# Patient Record
Sex: Male | Born: 1965 | Race: White | Hispanic: No | Marital: Single | Smoking: Never smoker
Health system: Southern US, Community
[De-identification: ages and names within clinical notes are randomized; demographics above are authoritative.]

## PROBLEM LIST (undated history)

## (undated) DIAGNOSIS — I639 Cerebral infarction, unspecified: Secondary | ICD-10-CM

## (undated) DIAGNOSIS — K219 Gastro-esophageal reflux disease without esophagitis: Secondary | ICD-10-CM

## (undated) DIAGNOSIS — I6529 Occlusion and stenosis of unspecified carotid artery: Secondary | ICD-10-CM

## (undated) DIAGNOSIS — I10 Essential (primary) hypertension: Secondary | ICD-10-CM

## (undated) DIAGNOSIS — E785 Hyperlipidemia, unspecified: Secondary | ICD-10-CM

## (undated) DIAGNOSIS — I2510 Atherosclerotic heart disease of native coronary artery without angina pectoris: Secondary | ICD-10-CM

## (undated) DIAGNOSIS — I252 Old myocardial infarction: Secondary | ICD-10-CM

## (undated) HISTORY — DX: Hyperlipidemia, unspecified: E78.5

## (undated) HISTORY — DX: Occlusion and stenosis of unspecified carotid artery: I65.29

## (undated) HISTORY — DX: Essential (primary) hypertension: I10

## (undated) HISTORY — PX: KNEE SURGERY: SHX244

## (undated) HISTORY — PX: BACK SURGERY: SHX140

## (undated) HISTORY — DX: Old myocardial infarction: I25.2

## (undated) HISTORY — DX: Atherosclerotic heart disease of native coronary artery without angina pectoris: I25.10

---

## 2001-01-08 ENCOUNTER — Encounter: Payer: Self-pay | Admitting: Emergency Medicine

## 2001-01-08 ENCOUNTER — Emergency Department (HOSPITAL_COMMUNITY): Admission: EM | Admit: 2001-01-08 | Discharge: 2001-01-08 | Payer: Self-pay | Admitting: Emergency Medicine

## 2003-01-07 ENCOUNTER — Encounter: Payer: Self-pay | Admitting: Emergency Medicine

## 2003-01-07 ENCOUNTER — Emergency Department (HOSPITAL_COMMUNITY): Admission: EM | Admit: 2003-01-07 | Discharge: 2003-01-07 | Payer: Self-pay | Admitting: Emergency Medicine

## 2003-01-09 ENCOUNTER — Ambulatory Visit (HOSPITAL_COMMUNITY): Admission: RE | Admit: 2003-01-09 | Discharge: 2003-01-10 | Payer: Self-pay | Admitting: Neurosurgery

## 2010-07-19 ENCOUNTER — Encounter: Payer: Self-pay | Admitting: Orthopedic Surgery

## 2010-07-19 ENCOUNTER — Ambulatory Visit (INDEPENDENT_AMBULATORY_CARE_PROVIDER_SITE_OTHER): Payer: BC Managed Care – PPO | Admitting: Orthopedic Surgery

## 2010-07-19 DIAGNOSIS — M19039 Primary osteoarthritis, unspecified wrist: Secondary | ICD-10-CM | POA: Insufficient documentation

## 2010-07-23 ENCOUNTER — Telehealth: Payer: Self-pay | Admitting: Orthopedic Surgery

## 2010-07-28 NOTE — Progress Notes (Signed)
Summary: Referral to Dr. Amanda Pea.  Phone Note Outgoing Call   Call placed by: Waldon Reining,  July 23, 2010 10:37 AM Call placed to: Specialist Action Taken: Information Sent Summary of Call: I faxed a referral for this patient to Dr. Amanda Pea to be seen for [facility] of left wrist.

## 2010-07-28 NOTE — Assessment & Plan Note (Signed)
Summary: wrsit pain needs xr/bcbs/bsf  coming 3:45   Vital Signs:  Patient profile:   45 year old male Height:      61 inches Weight:      231 pounds Pulse rate:   72 / minute Resp:     18 per minute  Vitals Entered By: Fuller Canada MD (July 19, 2010 4:24 PM)  Visit Type:  new patient Referring Provider:  self Primary Provider:  NA  CC:  wrist pain.  History of Present Illness: I saw Justin Villa in the office today for an initial visit.  He is a 45 years old man with the complaint of:  wrist pain.  He has LEFT wrist pain for the last 2 months. Questionable golf injury swinging a club, but nothing significant. He has pain on the radial  side of the wrist with swelling and what appears to be a mass. His pain level is 4/10. Sharp pain which tends to be increased with activity.   MEDS: NONE    Allergies (verified): No Known Drug Allergies  Past History:  Past Medical History: na  Past Surgical History: knee  back  Family History: na  Social History: Patient is married.  unemployed no smoking no alcohol one cup of caffeine per day 12th grade ed  Review of Systems Constitutional:  Denies weight loss, weight gain, fever, chills, and fatigue. Cardiovascular:  Denies chest pain, palpitations, fainting, and murmurs. Respiratory:  Denies short of breath, wheezing, couch, tightness, pain on inspiration, and snoring . Gastrointestinal:  Denies heartburn, nausea, vomiting, diarrhea, constipation, and blood in your stools. Genitourinary:  Denies frequency, urgency, difficulty urinating, painful urination, flank pain, and bleeding in urine. Neurologic:  Denies numbness, tingling, unsteady gait, dizziness, tremors, and seizure. Musculoskeletal:  Denies joint pain, swelling, instability, stiffness, redness, heat, and muscle pain. Endocrine:  Denies excessive thirst, exessive urination, and heat or cold intolerance. Psychiatric:  Denies nervousness, depression,  anxiety, and hallucinations. Skin:  Denies changes in the skin, poor healing, rash, itching, and redness. HEENT:  Denies blurred or double vision, eye pain, redness, and watering. Immunology:  Denies seasonal allergies, sinus problems, and allergic to bee stings. Hemoatologic:  Denies easy bleeding and brusing.  Physical Exam  Skin:  intact without lesions or rashes Cervical Nodes:  no significant adenopathy Psych:  alert and cooperative; normal mood and affect; normal attention span and concentration   Wrist/Hand Exam  General:    Well-developed, well-nourished, in no acute distress; alert and oriented x 3.    Inspection:    DORSAL-RADIAL LEFT WRISTswelling:   Palpation:    tenderness L-wrist: RADIO-SCAPHOID JOINT   Vascular:    Radial, ulnar, brachial, and axillary pulses 2+ and symmetric; capillary refill less than 2 seconds; no evidence of ischemia, clubbing, or cyanosis.    Sensory:    Gross sensation intact in the upper extremities.    Motor:    GRIP STRENGTH LEFT LESS THAN RIGHT   Wrist Exam:    Right:    Inspection:  Normal    Palpation:  Normal    Stability:  stable    Tenderness:  no    NORMAL ROM     Left:    Inspection:  Abnormal    Palpation:  Abnormal    Stability:  stable    Tenderness:  RADIO-SCAPHOID     Swelling:  YES RADIAL SIDE     DECREASED WRIST EXTENSION WITH PAIN   DECREASED FLEXION    Impression & Recommendations:  Problem #  1:  ARTHRITIS, WRIST (ICD-716.93)  Separate and Identifiable X-Ray report     AP and lateral, and oblique of the LEFT wrist.  There are multiple spurs at the edge of the scaphoid in the area is collapse of the radial scaphoid joint with some proximal migration of the capitate. The radio lunate joint is still preserved.  Impression radial scaphoid arthritis stage II/III    Based on the radiographic findings I have initiated treatment to include anti-inflammatories and bracing of the LEFT wrist and a referral  to a hand specialist for further advice regarding treatment.  Orders: Orthopedic Surgeon Referral (Ortho Surgeon) New Patient Level III [phone]) Wrist x-ray complete, minimum 3 views (73110)  Medications Added to Medication List This Visit: 1)  Nabumetone 500 Mg Tabs (Nabumetone) .Marland Kitchen.. 1 by mouth bid  Patient Instructions: 1)  Dr. Amanda Pea referral for advanced arthritis of the Radio-Scaphoid joint  Prescriptions: NABUMETONE 500 MG TABS (NABUMETONE) 1 by mouth bid  #60 x 2   Entered and Authorized by:   Fuller Canada MD   Signed by:   Fuller Canada MD on 07/19/2010   Method used:   Faxed to ...       [facility]* (retail)       [address]       [place], [place]  [address]       Ph: [phone]       Fax: [phone]   RxID:   [id number]    Orders Added: 1)  Orthopedic Surgeon Referral [Ortho Surgeon] 2)  New Patient Level III [44010] 3)  Wrist x-ray complete, minimum 3 views [73110]

## 2010-07-28 NOTE — Letter (Signed)
Summary: History form  History form   Imported By: Jacklynn Ganong 07/22/2010 17:00:20  _____________________________________________________________________  External Attachment:    Type:   Image     Comment:   External Document

## 2015-01-03 ENCOUNTER — Emergency Department (HOSPITAL_COMMUNITY)
Admission: EM | Admit: 2015-01-03 | Discharge: 2015-01-04 | Disposition: A | Discharge status: Home / Self Care | Payer: Self-pay | Attending: Emergency Medicine | Admitting: Emergency Medicine

## 2015-01-03 ENCOUNTER — Encounter (HOSPITAL_COMMUNITY): Payer: Self-pay | Admitting: *Deleted

## 2015-01-03 ENCOUNTER — Emergency Department (HOSPITAL_COMMUNITY): Payer: Self-pay

## 2015-01-03 DIAGNOSIS — R55 Syncope and collapse: Secondary | ICD-10-CM | POA: Insufficient documentation

## 2015-01-03 DIAGNOSIS — I10 Essential (primary) hypertension: Secondary | ICD-10-CM | POA: Insufficient documentation

## 2015-01-03 DIAGNOSIS — R51 Headache: Secondary | ICD-10-CM | POA: Insufficient documentation

## 2015-01-03 DIAGNOSIS — R61 Generalized hyperhidrosis: Secondary | ICD-10-CM | POA: Insufficient documentation

## 2015-01-03 LAB — TROPONIN I

## 2015-01-03 LAB — BASIC METABOLIC PANEL
Anion gap: 12 (ref 5–15)
BUN: 18 mg/dL (ref 6–20)
CO2: 22 mmol/L (ref 22–32)
Calcium: 8.8 mg/dL — ABNORMAL LOW (ref 8.9–10.3)
Chloride: 107 mmol/L (ref 101–111)
Creatinine, Ser: 1.13 mg/dL (ref 0.61–1.24)
GFR calc Af Amer: >60 mL/min (ref >60)
GFR calc non Af Amer: >60 mL/min (ref >60)
Glucose, Bld: 124 mg/dL — ABNORMAL HIGH (ref 65–99)
Potassium: 3.3 mmol/L — ABNORMAL LOW (ref 3.5–5.1)
Sodium: 141 mmol/L (ref 135–145)

## 2015-01-03 LAB — CBC WITH DIFFERENTIAL/PLATELET
BASOS PCT: 1 % (ref 0–1)
Basophils Absolute: 0.1 10*3/uL (ref 0.0–0.1)
EOS ABS: 0.4 10*3/uL (ref 0.0–0.7)
Eosinophils Relative: 4 % (ref 0–5)
HCT: 42.9 % (ref 39.0–52.0)
Hemoglobin: 15.3 g/dL (ref 13.0–17.0)
LYMPHS ABS: 3.4 10*3/uL (ref 0.7–4.0)
Lymphocytes Relative: 39 % (ref 12–46)
MCH: 29.4 pg (ref 26.0–34.0)
MCHC: 35.7 g/dL (ref 30.0–36.0)
MCV: 82.3 fL (ref 78.0–100.0)
Monocytes Absolute: 0.9 10*3/uL (ref 0.1–1.0)
Monocytes Relative: 11 % (ref 3–12)
NEUTROS PCT: 45 % (ref 43–77)
Neutro Abs: 4 10*3/uL (ref 1.7–7.7)
PLATELETS: 259 10*3/uL (ref 150–400)
RBC: 5.21 MIL/uL (ref 4.22–5.81)
RDW: 13.4 % (ref 11.5–15.5)
WBC: 8.7 10*3/uL (ref 4.0–10.5)

## 2015-01-03 LAB — RAPID URINE DRUG SCREEN, HOSP PERFORMED
Amphetamines: NOT DETECTED
Barbiturates: NOT DETECTED
Benzodiazepines: NOT DETECTED
COCAINE: NOT DETECTED
OPIATES: NOT DETECTED
Tetrahydrocannabinol: NOT DETECTED

## 2015-01-03 NOTE — ED Notes (Signed)
Per pt's son pt had passed out and possible jerking movements, pt alert and oriented to year, month and place; pt denies any pain but is diaphoretic currently

## 2015-01-03 NOTE — ED Notes (Signed)
Pt states she is unable to urinate at this time. Urinal placed at bedside. Advised that specimen needed as soon as possible.
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
## 2015-01-03 NOTE — ED Notes (Signed)
Pt's wife entered room and stated that pt just finished mowing the yard. Pt also informed nurse that he was stung by a bee to left eye as well. Denies any known allergies to bee stings

## 2015-01-04 LAB — URINALYSIS, ROUTINE W REFLEX MICROSCOPIC
BILIRUBIN URINE: NEGATIVE
GLUCOSE, UA: NEGATIVE mg/dL
HGB URINE DIPSTICK: NEGATIVE
KETONES UR: NEGATIVE mg/dL
LEUKOCYTES UA: NEGATIVE
NITRITE: NEGATIVE
PH: 5.5 (ref 5.0–8.0)
Protein, ur: 30 mg/dL — AB
Specific Gravity, Urine: >1.03 — ABNORMAL HIGH (ref 1.005–1.030)
Urobilinogen, UA: 0.2 mg/dL (ref 0.0–1.0)

## 2015-01-04 LAB — URINE MICROSCOPIC-ADD ON

## 2015-01-04 NOTE — Discharge Instructions (Signed)
Syncope °Syncope is a medical term for fainting or passing out. This means you lose consciousness and drop to the ground. People are generally unconscious for less than 5 minutes. You may have some muscle twitches for up to 15 seconds before waking up and returning to normal. Syncope occurs more often in older adults, but it can happen to anyone. While most causes of syncope are not dangerous, syncope can be a sign of a serious medical problem. It is important to seek medical care.  °CAUSES  °Syncope is caused by a sudden drop in blood flow to the brain. The specific cause is often not determined. Factors that can bring on syncope include: °· Taking medicines that lower blood pressure. °· Sudden changes in posture, such as standing up quickly. °· Taking more medicine than prescribed. °· Standing in one place for too long. °· Seizure disorders. °· Dehydration and excessive exposure to heat. °· Low blood sugar (hypoglycemia). °· Straining to have a bowel movement. °· Heart disease, irregular heartbeat, or other circulatory problems. °· Fear, emotional distress, seeing blood, or severe pain. °SYMPTOMS  °Right before fainting, you may: °· Feel dizzy or light-headed. °· Feel nauseous. °· See all white or all black in your field of vision. °· Have cold, clammy skin. °DIAGNOSIS  °Your health care provider will ask about your symptoms, perform a physical exam, and perform an electrocardiogram (ECG) to record the electrical activity of your heart. Your health care provider may also perform other heart or blood tests to determine the cause of your syncope which may include: °· Transthoracic echocardiogram (TTE). During echocardiography, sound waves are used to evaluate how blood flows through your heart. °· Transesophageal echocardiogram (TEE). °· Cardiac monitoring. This allows your health care provider to monitor your heart rate and rhythm in real time. °· Holter monitor. This is a portable device that records your  heartbeat and can help diagnose heart arrhythmias. It allows your health care provider to track your heart activity for several days, if needed. °· Stress tests by exercise or by giving medicine that makes the heart beat faster. °TREATMENT  °In most cases, no treatment is needed. Depending on the cause of your syncope, your health care provider may recommend changing or stopping some of your medicines. °HOME CARE INSTRUCTIONS °· Have someone stay with you until you feel stable. °· Do not drive, use machinery, or play sports until your health care provider says it is okay. °· Keep all follow-up appointments as directed by your health care provider. °· Lie down right away if you start feeling like you might faint. Breathe deeply and steadily. Wait until all the symptoms have passed. °· Drink enough fluids to keep your urine clear or pale yellow. °· If you are taking blood pressure or heart medicine, get up slowly and take several minutes to sit and then stand. This can reduce dizziness. °SEEK IMMEDIATE MEDICAL CARE IF:  °· You have a severe headache. °· You have unusual pain in the chest, abdomen, or back. °· You are bleeding from your mouth or rectum, or you have black or tarry stool. °· You have an irregular or very fast heartbeat. °· You have pain with breathing. °· You have repeated fainting or seizure-like jerking during an episode. °· You faint when sitting or lying down. °· You have confusion. °· You have trouble walking. °· You have severe weakness. °· You have vision problems. °If you fainted, call your local emergency services (911 in [place] Do not drive   yourself to the hospital.  MAKE SURE YOU:  Understand these instructions.  Will watch your condition.  Will get help right away if you are not doing well or get worse. Document Released: 05/23/2005 Document Revised: 05/28/2013 Document Reviewed: 07/22/2011 [facility] Patient Information 2015 [place], [place]. This information is not intended to replace  advice given to you by your health care provider. Make sure you discuss any questions you have with your health care provider.  Hypertension Hypertension, commonly called high blood pressure, is when the force of blood pumping through your arteries is too strong. Your arteries are the blood vessels that carry blood from your heart throughout your body. A blood pressure reading consists of a higher number over a lower number, such as 110/72. The higher number (systolic) is the pressure inside your arteries when your heart pumps. The lower number (diastolic) is the pressure inside your arteries when your heart relaxes. Ideally you want your blood pressure below 120/80. Hypertension forces your heart to work harder to pump blood. Your arteries may become narrow or stiff. Having hypertension puts you at risk for heart disease, stroke, and other problems.  RISK FACTORS Some risk factors for high blood pressure are controllable. Others are not.  Risk factors you cannot control include:   Race. You may be at higher risk if you are African American.  Age. Risk increases with age.  Gender. Men are at higher risk than women before age 57 years. After age 70, women are at higher risk than men. Risk factors you can control include:  Not getting enough exercise or physical activity.  Being overweight.  Getting too much fat, sugar, calories, or salt in your diet.  Drinking too much alcohol. SIGNS AND SYMPTOMS Hypertension does not usually cause signs or symptoms. Extremely high blood pressure (hypertensive crisis) may cause headache, anxiety, shortness of breath, and nosebleed. DIAGNOSIS  To check if you have hypertension, your health care provider will measure your blood pressure while you are seated, with your arm held at the level of your heart. It should be measured at least twice using the same arm. Certain conditions can cause a difference in blood pressure between your right and left arms. A blood  pressure reading that is higher than normal on one occasion does not mean that you need treatment. If one blood pressure reading is high, ask your health care provider about having it checked again. TREATMENT  Treating high blood pressure includes making lifestyle changes and possibly taking medicine. Living a healthy lifestyle can help lower high blood pressure. You may need to change some of your habits. Lifestyle changes may include:  Following the DASH diet. This diet is high in fruits, vegetables, and whole grains. It is low in salt, red meat, and added sugars.  Getting at least 2 hours of brisk physical activity every week.  Losing weight if necessary.  Not smoking.  Limiting alcoholic beverages.  Learning ways to reduce stress. If lifestyle changes are not enough to get your blood pressure under control, your health care provider may prescribe medicine. You may need to take more than one. Work closely with your health care provider to understand the risks and benefits. HOME CARE INSTRUCTIONS  Have your blood pressure rechecked as directed by your health care provider.   Take medicines only as directed by your health care provider. Follow the directions carefully. Blood pressure medicines must be taken as prescribed. The medicine does not work as well when you skip doses. Skipping doses  also puts you at risk for problems.   Do not smoke.   Monitor your blood pressure at home as directed by your health care provider. SEEK MEDICAL CARE IF:   You think you are having a reaction to medicines taken.  You have recurrent headaches or feel dizzy.  You have swelling in your ankles.  You have trouble with your vision. SEEK IMMEDIATE MEDICAL CARE IF:  You develop a severe headache or confusion.  You have unusual weakness, numbness, or feel faint.  You have severe chest or abdominal pain.  You vomit repeatedly.  You have trouble breathing. MAKE SURE YOU:   Understand  these instructions.  Will watch your condition.  Will get help right away if you are not doing well or get worse. Document Released: 05/23/2005 Document Revised: 10/07/2013 Document Reviewed: 03/15/2013 [facility] Patient Information 2015 [place], [place]. This information is not intended to replace advice given to you by your health care provider. Make sure you discuss any questions you have with your health care provider.  DASH Eating Plan DASH stands for "Dietary Approaches to Stop Hypertension." The DASH eating plan is a healthy eating plan that has been shown to reduce high blood pressure (hypertension). Additional health benefits may include reducing the risk of type 2 diabetes mellitus, heart disease, and stroke. The DASH eating plan may also help with weight loss. WHAT DO I NEED TO KNOW ABOUT THE DASH EATING PLAN? For the DASH eating plan, you will follow these general guidelines:  Choose foods with a percent daily value for sodium of less than 5% (as listed on the food label).  Use salt-free seasonings or herbs instead of table salt or sea salt.  Check with your health care provider or pharmacist before using salt substitutes.  Eat lower-sodium products, often labeled as "lower sodium" or "no salt added."  Eat fresh foods.  Eat more vegetables, fruits, and low-fat dairy products.  Choose whole grains. Look for the word "whole" as the first word in the ingredient list.  Choose fish and skinless chicken or [place] more often than red meat. Limit fish, poultry, and meat to 6 oz (170 g) each day.  Limit sweets, desserts, sugars, and sugary drinks.  Choose heart-healthy fats.  Limit cheese to 1 oz (28 g) per day.  Eat more home-cooked food and less restaurant, buffet, and fast food.  Limit fried foods.  Cook foods using methods other than frying.  Limit canned vegetables. If you do use them, rinse them well to decrease the sodium.  When eating at a restaurant, ask that your  food be prepared with less salt, or no salt if possible. WHAT FOODS CAN I EAT? Seek help from a dietitian for individual calorie needs. Grains Whole grain or whole wheat bread. Brown rice. Whole grain or whole wheat pasta. Quinoa, bulgur, and whole grain cereals. Low-sodium cereals. Corn or whole wheat flour tortillas. Whole grain cornbread. Whole grain crackers. Low-sodium crackers. Vegetables Fresh or frozen vegetables (raw, steamed, roasted, or grilled). Low-sodium or reduced-sodium tomato and vegetable juices. Low-sodium or reduced-sodium tomato sauce and paste. Low-sodium or reduced-sodium canned vegetables.  Fruits All fresh, canned (in natural juice), or frozen fruits. Meat and Other Protein Products Ground beef (85% or leaner), grass-fed beef, or beef trimmed of fat. Skinless chicken or [place]. Ground chicken or [place]. Pork trimmed of fat. All fish and seafood. Eggs. Dried beans, peas, or lentils. Unsalted nuts and seeds. Unsalted canned beans. Dairy Low-fat dairy products, such as skim or 1%  milk, 2% or reduced-fat cheeses, low-fat ricotta or cottage cheese, or plain low-fat yogurt. Low-sodium or reduced-sodium cheeses. Fats and Oils Tub margarines without trans fats. Light or reduced-fat mayonnaise and salad dressings (reduced sodium). Avocado. Safflower, olive, or canola oils. Natural peanut or almond butter. Other Unsalted popcorn and pretzels. The items listed above may not be a complete list of recommended foods or beverages. Contact your dietitian for more options. WHAT FOODS ARE NOT RECOMMENDED? Grains White bread. White pasta. White rice. Refined cornbread. Bagels and croissants. Crackers that contain trans fat. Vegetables Creamed or fried vegetables. Vegetables in a cheese sauce. Regular canned vegetables. Regular canned tomato sauce and paste. Regular tomato and vegetable juices. Fruits Dried fruits. Canned fruit in light or heavy syrup. Fruit juice. Meat and Other  Protein Products Fatty cuts of meat. Ribs, chicken wings, bacon, sausage, bologna, salami, chitterlings, fatback, hot dogs, bratwurst, and packaged luncheon meats. Salted nuts and seeds. Canned beans with salt. Dairy Whole or 2% milk, cream, half-and-half, and cream cheese. Whole-fat or sweetened yogurt. Full-fat cheeses or blue cheese. Nondairy creamers and whipped toppings. Processed cheese, cheese spreads, or cheese curds. Condiments Onion and garlic salt, seasoned salt, table salt, and sea salt. Canned and packaged gravies. Worcestershire sauce. Tartar sauce. Barbecue sauce. Teriyaki sauce. Soy sauce, including reduced sodium. Steak sauce. Fish sauce. Oyster sauce. Cocktail sauce. Horseradish. Ketchup and mustard. Meat flavorings and tenderizers. Bouillon cubes. Hot sauce. Tabasco sauce. Marinades. Taco seasonings. Relishes. Fats and Oils Butter, stick margarine, lard, shortening, ghee, and bacon fat. Coconut, palm kernel, or palm oils. Regular salad dressings. Other Pickles and olives. Salted popcorn and pretzels. The items listed above may not be a complete list of foods and beverages to avoid. Contact your dietitian for more information. WHERE CAN I FIND MORE INFORMATION? National Heart, [employer], and [employer] Institute: [employer] Document Released: 05/12/2011 Document Revised: 10/07/2013 Document Reviewed: 03/27/2013 [facility] Patient Information 2015 [place], [place]. This information is not intended to replace advice given to you by your health care provider. Make sure you discuss any questions you have with your health care provider.

## 2015-01-04 NOTE — ED Notes (Signed)
Discharge instructions given, pt demonstrated teach back and verbal understanding. No concerns voiced.  

## 2015-11-05 DIAGNOSIS — I252 Old myocardial infarction: Secondary | ICD-10-CM

## 2015-11-05 HISTORY — DX: Old myocardial infarction: I25.2

## 2015-11-25 ENCOUNTER — Inpatient Hospital Stay (HOSPITAL_COMMUNITY)
Admission: EM | Admit: 2015-11-25 | Discharge: 2015-12-01 | DRG: 234 | Disposition: A | Discharge status: Home / Self Care | Payer: PRIVATE HEALTH INSURANCE | Attending: Cardiothoracic Surgery | Admitting: Cardiothoracic Surgery

## 2015-11-25 DIAGNOSIS — R443 Hallucinations, unspecified: Secondary | ICD-10-CM | POA: Diagnosis not present

## 2015-11-25 DIAGNOSIS — I2510 Atherosclerotic heart disease of native coronary artery without angina pectoris: Secondary | ICD-10-CM | POA: Diagnosis present

## 2015-11-25 DIAGNOSIS — E669 Obesity, unspecified: Secondary | ICD-10-CM | POA: Diagnosis present

## 2015-11-25 DIAGNOSIS — Z01810 Encounter for preprocedural cardiovascular examination: Secondary | ICD-10-CM

## 2015-11-25 DIAGNOSIS — D62 Acute posthemorrhagic anemia: Secondary | ICD-10-CM | POA: Diagnosis not present

## 2015-11-25 DIAGNOSIS — I25110 Atherosclerotic heart disease of native coronary artery with unstable angina pectoris: Secondary | ICD-10-CM | POA: Diagnosis present

## 2015-11-25 DIAGNOSIS — Z7982 Long term (current) use of aspirin: Secondary | ICD-10-CM

## 2015-11-25 DIAGNOSIS — Z8249 Family history of ischemic heart disease and other diseases of the circulatory system: Secondary | ICD-10-CM

## 2015-11-25 DIAGNOSIS — I119 Hypertensive heart disease without heart failure: Secondary | ICD-10-CM | POA: Diagnosis present

## 2015-11-25 DIAGNOSIS — Z7902 Long term (current) use of antithrombotics/antiplatelets: Secondary | ICD-10-CM

## 2015-11-25 DIAGNOSIS — Z6831 Body mass index (BMI) 31.0-31.9, adult: Secondary | ICD-10-CM

## 2015-11-25 DIAGNOSIS — Z951 Presence of aortocoronary bypass graft: Secondary | ICD-10-CM

## 2015-11-25 DIAGNOSIS — I214 Non-ST elevation (NSTEMI) myocardial infarction: Principal | ICD-10-CM | POA: Diagnosis present

## 2015-11-25 DIAGNOSIS — E785 Hyperlipidemia, unspecified: Secondary | ICD-10-CM | POA: Diagnosis present

## 2015-11-25 DIAGNOSIS — J9811 Atelectasis: Secondary | ICD-10-CM | POA: Diagnosis not present

## 2015-11-25 DIAGNOSIS — I249 Acute ischemic heart disease, unspecified: Secondary | ICD-10-CM

## 2015-11-25 DIAGNOSIS — I200 Unstable angina: Secondary | ICD-10-CM | POA: Diagnosis not present

## 2015-11-25 DIAGNOSIS — E8770 Fluid overload, unspecified: Secondary | ICD-10-CM | POA: Diagnosis not present

## 2015-11-25 DIAGNOSIS — Z09 Encounter for follow-up examination after completed treatment for conditions other than malignant neoplasm: Secondary | ICD-10-CM

## 2015-11-25 NOTE — ED Provider Notes (Signed)
CSN: [id number]     Arrival date & time 11/25/15  2348 History  By signing my name below, I, Justin Villa, attest that this documentation has been prepared under the direction and in the presence of [name], MD. Electronically Signed: [name], ED Scribe. 11/25/2015. 12:14 AM.    Chief Complaint  Patient presents with  . Chest Pain   Patient is a 50 y.o. male presenting with chest pain. The history is provided by the patient. No language interpreter was used.  Chest Pain Pain location:  L chest Pain quality: sharp   Pain radiates to:  L arm Pain radiates to the back: no   Pain severity:  Moderate Onset quality:  Gradual Duration:  2 days Timing:  Constant Progression:  Worsening Context: raising an arm   Relieved by:  Nothing Worsened by:  Nothing tried Ineffective treatments:  None tried Associated symptoms: shortness of breath   Associated symptoms: no diaphoresis, no nausea and not vomiting   Risk factors: hypertension    HPI Comments: Justin Villa is a 50 y.o. male who presents to the Emergency Department complaining of gradually worsening waxing and weaning moderate sharp left-sided chest pain that radiates towards his left arm onset 2-3 days ago. He reports associated SOB. He notes that the pain is worse tonight. He states that lifting his left shoulder/arm above his head provides mild relief. No alleviating factors noted. Pt has not tried any medications PTA. He denies that he is a current smoker. She states that he was diagnosed with hypertension several years ago but is not on any current medication for it. He reports a family hx of heart issues with father and uncles in their 1850's. He denies any n/v or diaphoresis.   History reviewed. No pertinent past medical history. Past Surgical History  Procedure Laterality Date  . Back surgery    . Knee surgery     No family history on file. Social History  Substance Use Topics  . Smoking status:  Never Smoker   . Smokeless tobacco: None  . Alcohol Use: No    Review of Systems  Constitutional: Negative for diaphoresis.  Respiratory: Positive for shortness of breath.   Cardiovascular: Positive for chest pain.  Gastrointestinal: Negative for nausea and vomiting.  All other systems reviewed and are negative.     Allergies  Review of patient's allergies indicates no known allergies.  Home Medications   Prior to Admission medications   Not on File   BP 167/103 mmHg  Pulse 85  Temp(Src) 97.9 F (36.6 C) (Oral)  Resp 19  Ht 6' (1.829 m)  Wt 230 lb (104.327 kg)  BMI 31.19 kg/m2  SpO2 95% Physical Exam  Constitutional: He is oriented to person, place, and time. He appears well-developed and well-nourished. He appears distressed.  HENT:  Head: Normocephalic and atraumatic.  Right Ear: Hearing normal.  Left Ear: Hearing normal.  Nose: Nose normal.  Mouth/Throat: Oropharynx is clear and moist and mucous membranes are normal.  Eyes: Conjunctivae and EOM are normal. Pupils are equal, round, and reactive to light.  Neck: Normal range of motion. Neck supple.  Cardiovascular: Regular rhythm, S1 normal and S2 normal.  Tachycardia present.  Exam reveals no gallop and no friction rub.   No murmur heard. Pulmonary/Chest: Effort normal and breath sounds normal. No respiratory distress. He exhibits no tenderness.  Abdominal: Soft. Normal appearance and bowel sounds are normal. There is no hepatosplenomegaly. There is no tenderness. There is no  rebound, no guarding, no tenderness at McBurney's point and negative Murphy's sign. No hernia.  Musculoskeletal: Normal range of motion.  Neurological: He is alert and oriented to person, place, and time. He has normal strength. No cranial nerve deficit or sensory deficit. Coordination normal. GCS eye subscore is 4. GCS verbal subscore is 5. GCS motor subscore is 6.  Skin: Skin is warm, dry and intact. No rash noted. No cyanosis.  Psychiatric:  He has a normal mood and affect. His speech is normal and behavior is normal. Thought content normal.  Nursing note and vitals reviewed.   ED Course  Procedures (including critical care time) DIAGNOSTIC STUDIES: Oxygen Saturation is 96% on RA, normal by my interpretation.    COORDINATION OF CARE: 12:12 AM- Pt advised of plan for treatment and pt agrees. Pt will receive NTG and aspirin. He will also receive chest x-ray and lab work for further evaluation.    Labs Review Labs Reviewed  CBC WITH DIFFERENTIAL/PLATELET - Abnormal; Notable for the following:    MCHC 36.3 (*)    All other components within normal limits  COMPREHENSIVE METABOLIC PANEL  LIPASE, BLOOD  TROPONIN I  BRAIN NATRIURETIC PEPTIDE  PROTIME-INR  APTT  I-STAT TROPOININ, ED    Imaging Review Dg Chest Port 1 View  11/26/2015  CLINICAL DATA:  Acute onset of moderate sharp left-sided chest pain, radiating to the left arm. Shortness of breath. Initial encounter. EXAM: PORTABLE CHEST 1 VIEW COMPARISON:  Chest radiograph performed 01/03/2015 FINDINGS: The lungs are well-aerated. Pulmonary vascularity is at the upper limits of normal. There is no evidence of focal opacification, pleural effusion or pneumothorax. The cardiomediastinal silhouette is borderline normal in size. No acute osseous abnormalities are seen. IMPRESSION: No acute cardiopulmonary process seen. Electronically Signed   By: [name] M.D.   On: 11/26/2015 00:40     [name], MD has personally reviewed and evaluated these images and lab results as part of his medical decision-making.   ED ECG REPORT   Date: 11/26/2015  Rate: 96  Rhythm: normal sinus rhythm  QRS Axis: left  Intervals: normal  ST/T Wave abnormalities: diffuse ST depressions suggestive of ischemia  Conduction Disutrbances:left anterior fascicular block  Narrative Interpretation:   Old EKG Reviewed: changes noted  I have personally reviewed the EKG tracing and agree  with the computerized printout as noted.     MDM   Final diagnoses:  ACS (acute coronary syndrome) [place])    Patient presents to the emergency room for evaluation of chest pain. Patient reports intermittent left-sided chest pain for the last 2 or 3 days but pain has become continuous and significantly worse tonight. Patient reports a deep and sometimes sharp pain in the left chest that radiates to the left arm. He is feeling shortness of breath associated with the symptoms. Presentation EKG shows evidence of diffuse ischemia. He does have elevations in aVR and V1, this could be indicative of left main disease. Posterior EKG, however, did not show elevations to suggest acute posterior MI. Discussed with Dr. Allena Katz, on call for cardiology. He has reviewed the patient's EKG and agrees that the patient should be transferred to [facility] stepdown unit under care of cardiology service. No evidence of STEMI present.  Patient was very hypertensive at arrival. This was treated with initially sublingual nitroglycerin and then IV nitroglycerin. Patient given aspirin at presentation. At request of Dr. Allena Katz, given 5 mg of Lopressor IV. Patient's pain is improving.  CRITICAL CARE Performed by:  Jackqulyn Mendel J.   Total critical care time: 30 minutes  Critical care time was exclusive of separately billable procedures and treating other patients.  Critical care was necessary to treat or prevent imminent or life-threatening deterioration.  Critical care was time spent personally by me on the following activities: development of treatment plan with patient and/or surrogate as well as nursing, discussions with consultants, evaluation of patient's response to treatment, examination of patient, obtaining history from patient or surrogate, ordering and performing treatments and interventions, ordering and review of laboratory studies, ordering and review of radiographic studies, pulse oximetry and  re-evaluation of patient's condition.   I personally performed the services described in this documentation, which was scribed in my presence. The recorded information has been reviewed and is accurate.   [name], MD 11/26/15 [phone]

## 2015-11-25 NOTE — ED Notes (Signed)
Pt c/o left chest and left arm pain x 2-3 days, worse today, states is worse with deep breath and movement.

## 2015-11-26 ENCOUNTER — Inpatient Hospital Stay (HOSPITAL_COMMUNITY): Payer: PRIVATE HEALTH INSURANCE

## 2015-11-26 ENCOUNTER — Other Ambulatory Visit: Payer: Self-pay

## 2015-11-26 ENCOUNTER — Encounter (HOSPITAL_COMMUNITY)
Admission: EM | Disposition: A | Discharge status: Home / Self Care | Payer: Self-pay | Source: Home / Self Care | Attending: Cardiothoracic Surgery

## 2015-11-26 ENCOUNTER — Emergency Department (HOSPITAL_COMMUNITY): Payer: PRIVATE HEALTH INSURANCE

## 2015-11-26 ENCOUNTER — Encounter (HOSPITAL_COMMUNITY): Payer: Self-pay

## 2015-11-26 DIAGNOSIS — E8770 Fluid overload, unspecified: Secondary | ICD-10-CM | POA: Diagnosis not present

## 2015-11-26 DIAGNOSIS — E669 Obesity, unspecified: Secondary | ICD-10-CM | POA: Diagnosis not present

## 2015-11-26 DIAGNOSIS — Z8249 Family history of ischemic heart disease and other diseases of the circulatory system: Secondary | ICD-10-CM | POA: Diagnosis not present

## 2015-11-26 DIAGNOSIS — I2510 Atherosclerotic heart disease of native coronary artery without angina pectoris: Secondary | ICD-10-CM

## 2015-11-26 DIAGNOSIS — E785 Hyperlipidemia, unspecified: Secondary | ICD-10-CM | POA: Diagnosis not present

## 2015-11-26 DIAGNOSIS — I214 Non-ST elevation (NSTEMI) myocardial infarction: Secondary | ICD-10-CM | POA: Diagnosis present

## 2015-11-26 DIAGNOSIS — I119 Hypertensive heart disease without heart failure: Secondary | ICD-10-CM | POA: Diagnosis not present

## 2015-11-26 DIAGNOSIS — I200 Unstable angina: Secondary | ICD-10-CM | POA: Diagnosis present

## 2015-11-26 DIAGNOSIS — R443 Hallucinations, unspecified: Secondary | ICD-10-CM | POA: Diagnosis not present

## 2015-11-26 DIAGNOSIS — I25110 Atherosclerotic heart disease of native coronary artery with unstable angina pectoris: Secondary | ICD-10-CM | POA: Diagnosis not present

## 2015-11-26 DIAGNOSIS — J9811 Atelectasis: Secondary | ICD-10-CM | POA: Diagnosis not present

## 2015-11-26 DIAGNOSIS — I10 Essential (primary) hypertension: Secondary | ICD-10-CM

## 2015-11-26 DIAGNOSIS — Z6831 Body mass index (BMI) 31.0-31.9, adult: Secondary | ICD-10-CM | POA: Diagnosis not present

## 2015-11-26 DIAGNOSIS — Z7982 Long term (current) use of aspirin: Secondary | ICD-10-CM | POA: Diagnosis not present

## 2015-11-26 DIAGNOSIS — I213 ST elevation (STEMI) myocardial infarction of unspecified site: Secondary | ICD-10-CM | POA: Diagnosis not present

## 2015-11-26 DIAGNOSIS — Z7902 Long term (current) use of antithrombotics/antiplatelets: Secondary | ICD-10-CM | POA: Diagnosis not present

## 2015-11-26 DIAGNOSIS — I249 Acute ischemic heart disease, unspecified: Secondary | ICD-10-CM

## 2015-11-26 DIAGNOSIS — D62 Acute posthemorrhagic anemia: Secondary | ICD-10-CM | POA: Diagnosis not present

## 2015-11-26 HISTORY — PX: CARDIAC CATHETERIZATION: SHX172

## 2015-11-26 LAB — PULMONARY FUNCTION TEST
FEF 25-75 Post: 5.33 L/sec
FEF 25-75 Pre: 4.14 L/sec
FEF2575-%Change-Post: 28 %
FEF2575-%Pred-Post: 150 %
FEF2575-%Pred-Pre: 116 %
FEV1-%Change-Post: 10 %
FEV1-%Pred-Post: 87 %
FEV1-%Pred-Pre: 79 %
FEV1-Post: 3.61 L
FEV1-Pre: 3.26 L
FEV1FVC-%Change-Post: 1 %
FEV1FVC-%Pred-Pre: 110 %
FEV6-%Change-Post: 8 %
FEV6-%Pred-Post: 81 %
FEV6-%Pred-Pre: 74 %
FEV6-Post: 4.17 L
FEV6-Pre: 3.83 L
FEV6FVC-%Change-Post: 0 %
FEV6FVC-%Pred-Post: 103 %
FEV6FVC-%Pred-Pre: 104 %
FVC-%Change-Post: 9 %
FVC-%Pred-Post: 78 %
FVC-%Pred-Pre: 71 %
FVC-Post: 4.18 L
FVC-Pre: 3.83 L
Post FEV1/FVC ratio: 86 %
Post FEV6/FVC ratio: 100 %
Pre FEV1/FVC ratio: 85 %
Pre FEV6/FVC Ratio: 100 %

## 2015-11-26 LAB — TYPE AND SCREEN
ABO/RH(D): A POS
Antibody Screen: NEGATIVE

## 2015-11-26 LAB — CBC WITH DIFFERENTIAL/PLATELET
Basophils Absolute: 0.1 10*3/uL (ref 0.0–0.1)
Basophils Relative: 1 %
Eosinophils Absolute: 0.4 10*3/uL (ref 0.0–0.7)
Eosinophils Relative: 5 %
HEMATOCRIT: 41.3 % (ref 39.0–52.0)
HEMOGLOBIN: 15 g/dL (ref 13.0–17.0)
LYMPHS ABS: 2.8 10*3/uL (ref 0.7–4.0)
Lymphocytes Relative: 36 %
MCH: 29.7 pg (ref 26.0–34.0)
MCHC: 36.3 g/dL — AB (ref 30.0–36.0)
MCV: 81.8 fL (ref 78.0–100.0)
MONOS PCT: 12 %
Monocytes Absolute: 0.9 10*3/uL (ref 0.1–1.0)
NEUTROS ABS: 3.7 10*3/uL (ref 1.7–7.7)
Neutrophils Relative %: 46 %
Platelets: 244 10*3/uL (ref 150–400)
RBC: 5.05 MIL/uL (ref 4.22–5.81)
RDW: 13.7 % (ref 11.5–15.5)
WBC: 8 10*3/uL (ref 4.0–10.5)

## 2015-11-26 LAB — COMPREHENSIVE METABOLIC PANEL
ALBUMIN: 4.6 g/dL (ref 3.5–5.0)
ALK PHOS: 55 U/L (ref 38–126)
ALT: 46 U/L (ref 17–63)
ANION GAP: 7 (ref 5–15)
AST: 28 U/L (ref 15–41)
BILIRUBIN TOTAL: 1.6 mg/dL — AB (ref 0.3–1.2)
BUN: 17 mg/dL (ref 6–20)
CALCIUM: 8.8 mg/dL — AB (ref 8.9–10.3)
CO2: 25 mmol/L (ref 22–32)
CREATININE: 0.98 mg/dL (ref 0.61–1.24)
Chloride: 107 mmol/L (ref 101–111)
GFR calc Af Amer: >60 mL/min (ref >60)
GFR calc non Af Amer: >60 mL/min (ref >60)
GLUCOSE: 108 mg/dL — AB (ref 65–99)
Potassium: 3.3 mmol/L — ABNORMAL LOW (ref 3.5–5.1)
Sodium: 139 mmol/L (ref 135–145)
TOTAL PROTEIN: 7.2 g/dL (ref 6.5–8.1)

## 2015-11-26 LAB — LIPID PANEL
CHOL/HDL RATIO: 4.5 ratio
CHOLESTEROL: 158 mg/dL (ref 0–200)
HDL: 35 mg/dL — AB (ref >40)
LDL Cholesterol: 96 mg/dL (ref 0–99)
TRIGLYCERIDES: 135 mg/dL (ref <150)
VLDL: 27 mg/dL (ref 0–40)

## 2015-11-26 LAB — BASIC METABOLIC PANEL
ANION GAP: 7 (ref 5–15)
BUN: 14 mg/dL (ref 6–20)
CHLORIDE: 106 mmol/L (ref 101–111)
CO2: 25 mmol/L (ref 22–32)
CREATININE: 1.01 mg/dL (ref 0.61–1.24)
Calcium: 8.8 mg/dL — ABNORMAL LOW (ref 8.9–10.3)
GFR calc non Af Amer: >60 mL/min (ref >60)
Glucose, Bld: 101 mg/dL — ABNORMAL HIGH (ref 65–99)
POTASSIUM: 3.9 mmol/L (ref 3.5–5.1)
Sodium: 138 mmol/L (ref 135–145)

## 2015-11-26 LAB — PROTIME-INR
INR: 1.26 (ref 0.00–1.49)
Prothrombin Time: 16 seconds — ABNORMAL HIGH (ref 11.6–15.2)

## 2015-11-26 LAB — URINALYSIS, ROUTINE W REFLEX MICROSCOPIC
Bilirubin Urine: NEGATIVE
Glucose, UA: NEGATIVE mg/dL
Ketones, ur: NEGATIVE mg/dL
Leukocytes, UA: NEGATIVE
Nitrite: NEGATIVE
Protein, ur: NEGATIVE mg/dL
Specific Gravity, Urine: 1.012 (ref 1.005–1.030)
pH: 7 (ref 5.0–8.0)

## 2015-11-26 LAB — CBC
HCT: 38.2 % — ABNORMAL LOW (ref 39.0–52.0)
Hemoglobin: 13.1 g/dL (ref 13.0–17.0)
MCH: 28.6 pg (ref 26.0–34.0)
MCHC: 34.3 g/dL (ref 30.0–36.0)
MCV: 83.4 fL (ref 78.0–100.0)
PLATELETS: 221 10*3/uL (ref 150–400)
RBC: 4.58 MIL/uL (ref 4.22–5.81)
RDW: 13.7 % (ref 11.5–15.5)
WBC: 7 10*3/uL (ref 4.0–10.5)

## 2015-11-26 LAB — ABO/RH: ABO/RH(D): A POS

## 2015-11-26 LAB — MRSA PCR SCREENING: MRSA BY PCR: NEGATIVE

## 2015-11-26 LAB — BLOOD GAS, ARTERIAL
Acid-base deficit: 1.9 mmol/L (ref 0.0–2.0)
Bicarbonate: 22.2 mEq/L (ref 20.0–24.0)
Drawn by: 103701
FIO2: 0.21
O2 Saturation: 95.7 %
Patient temperature: 98.6
TCO2: 23.3 mmol/L (ref 0–100)
pCO2 arterial: 37 mmHg (ref 35.0–45.0)
pH, Arterial: 7.396 (ref 7.350–7.450)
pO2, Arterial: 81.6 mmHg (ref 80.0–100.0)

## 2015-11-26 LAB — URINE MICROSCOPIC-ADD ON

## 2015-11-26 LAB — LIPASE, BLOOD: Lipase: 23 U/L (ref 11–51)

## 2015-11-26 LAB — I-STAT TROPONIN, ED: Troponin i, poc: 0.01 ng/mL (ref 0.00–0.08)

## 2015-11-26 LAB — BRAIN NATRIURETIC PEPTIDE: B NATRIURETIC PEPTIDE 5: 28 pg/mL (ref 0.0–100.0)

## 2015-11-26 LAB — TROPONIN I
TROPONIN I: 1.71 ng/mL — AB (ref <0.031)
Troponin I: 2.66 ng/mL (ref <0.031)
Troponin I: 3.16 ng/mL (ref <0.031)
Troponin I: <0.03 ng/mL (ref <0.031)

## 2015-11-26 LAB — SURGICAL PCR SCREEN
MRSA, PCR: NEGATIVE
Staphylococcus aureus: NEGATIVE

## 2015-11-26 LAB — APTT: APTT: 33 s (ref 24–37)

## 2015-11-26 SURGERY — LEFT HEART CATH AND CORONARY ANGIOGRAPHY

## 2015-11-26 MED ORDER — POTASSIUM CHLORIDE 2 MEQ/ML IV SOLN
80.0000 meq | INTRAVENOUS | Status: DC
  Filled 2015-11-26: qty 40

## 2015-11-26 MED ORDER — SODIUM CHLORIDE 0.9 % IV SOLN
INTRAVENOUS | Status: DC
  Filled 2015-11-26: qty 30

## 2015-11-26 MED ORDER — HEPARIN BOLUS VIA INFUSION
4000.0000 [IU] | Freq: Once | INTRAVENOUS | Status: AC
  Administered 2015-11-26: 4000 [IU] via INTRAVENOUS

## 2015-11-26 MED ORDER — SODIUM CHLORIDE 0.9 % WEIGHT BASED INFUSION
3.0000 mL/kg/h | INTRAVENOUS | Status: AC
Start: 2015-11-26 — End: 2015-11-26
  Administered 2015-11-26 (×2): 3 mL/kg/h via INTRAVENOUS

## 2015-11-26 MED ORDER — ASPIRIN 81 MG PO CHEW
324.0000 mg | CHEWABLE_TABLET | Freq: Once | ORAL | Status: AC
  Administered 2015-11-26: 324 mg via ORAL
  Filled 2015-11-26: qty 4

## 2015-11-26 MED ORDER — PLASMA-LYTE 148 IV SOLN
INTRAVENOUS | Status: AC
  Administered 2015-11-27: 500 mL
  Filled 2015-11-26: qty 2.5

## 2015-11-26 MED ORDER — ACETAMINOPHEN 325 MG PO TABS
650.0000 mg | ORAL_TABLET | ORAL | Status: DC | PRN
  Filled 2015-11-26 (×2): qty 2

## 2015-11-26 MED ORDER — CHLORHEXIDINE GLUCONATE CLOTH 2 % EX PADS
6.0000 | MEDICATED_PAD | Freq: Once | CUTANEOUS | Status: AC
  Administered 2015-11-27: 6 via TOPICAL

## 2015-11-26 MED ORDER — SODIUM CHLORIDE 0.9 % IV SOLN: 250.0000 mL | INTRAVENOUS | Status: DC | PRN

## 2015-11-26 MED ORDER — ATORVASTATIN CALCIUM 80 MG PO TABS
80.0000 mg | ORAL_TABLET | Freq: Every day | ORAL | Status: DC
  Administered 2015-11-26 – 2015-11-30 (×5): 80 mg via ORAL
  Filled 2015-11-26 (×5): qty 1

## 2015-11-26 MED ORDER — MIDAZOLAM HCL 2 MG/2ML IJ SOLN
INTRAMUSCULAR | Status: DC | PRN
  Administered 2015-11-26 (×2): 1 mg via INTRAVENOUS

## 2015-11-26 MED ORDER — FENTANYL CITRATE (PF) 100 MCG/2ML IJ SOLN
INTRAMUSCULAR | Status: DC | PRN
  Administered 2015-11-26: 50 ug via INTRAVENOUS

## 2015-11-26 MED ORDER — LISINOPRIL 10 MG PO TABS
10.0000 mg | ORAL_TABLET | Freq: Every day | ORAL | Status: DC
  Administered 2015-11-26: 10 mg via ORAL
  Filled 2015-11-26: qty 1

## 2015-11-26 MED ORDER — SODIUM CHLORIDE 0.9% FLUSH: 3.0000 mL | INTRAVENOUS | Status: DC | PRN

## 2015-11-26 MED ORDER — HEPARIN (PORCINE) IN NACL 100-0.45 UNIT/ML-% IJ SOLN
1300.0000 [IU]/h | INTRAMUSCULAR | Status: DC
  Administered 2015-11-26: 1300 [IU]/h via INTRAVENOUS
  Filled 2015-11-26: qty 250

## 2015-11-26 MED ORDER — HEPARIN SODIUM (PORCINE) 1000 UNIT/ML IJ SOLN
INTRAMUSCULAR | Status: AC
  Filled 2015-11-26: qty 1

## 2015-11-26 MED ORDER — NITROGLYCERIN IN D5W 200-5 MCG/ML-% IV SOLN
2.0000 ug/min | INTRAVENOUS | Status: DC
  Filled 2015-11-26: qty 250

## 2015-11-26 MED ORDER — ACETAMINOPHEN 325 MG PO TABS
650.0000 mg | ORAL_TABLET | ORAL | Status: DC | PRN
  Administered 2015-11-26 (×2): 650 mg via ORAL

## 2015-11-26 MED ORDER — BISACODYL 5 MG PO TBEC: 5.0000 mg | DELAYED_RELEASE_TABLET | Freq: Once | ORAL | Status: DC

## 2015-11-26 MED ORDER — CARVEDILOL 12.5 MG PO TABS
12.5000 mg | ORAL_TABLET | Freq: Two times a day (BID) | ORAL | Status: DC
  Administered 2015-11-26 (×2): 12.5 mg via ORAL
  Filled 2015-11-26 (×2): qty 1

## 2015-11-26 MED ORDER — VERAPAMIL HCL 2.5 MG/ML IV SOLN
INTRAVENOUS | Status: AC
  Filled 2015-11-26: qty 2

## 2015-11-26 MED ORDER — AMINOCAPROIC ACID 250 MG/ML IV SOLN
INTRAVENOUS | Status: DC
  Filled 2015-11-26 (×2): qty 40

## 2015-11-26 MED ORDER — SODIUM CHLORIDE 0.9% FLUSH
3.0000 mL | Freq: Two times a day (BID) | INTRAVENOUS | Status: DC
  Administered 2015-11-26: 3 mL via INTRAVENOUS

## 2015-11-26 MED ORDER — ASPIRIN EC 81 MG PO TBEC: 81.0000 mg | DELAYED_RELEASE_TABLET | Freq: Every day | ORAL | Status: DC

## 2015-11-26 MED ORDER — MIDAZOLAM HCL 2 MG/2ML IJ SOLN
INTRAMUSCULAR | Status: AC
  Filled 2015-11-26: qty 2

## 2015-11-26 MED ORDER — CHLORHEXIDINE GLUCONATE CLOTH 2 % EX PADS
6.0000 | MEDICATED_PAD | Freq: Once | CUTANEOUS | Status: AC
  Administered 2015-11-26: 6 via TOPICAL

## 2015-11-26 MED ORDER — METOPROLOL TARTRATE 5 MG/5ML IV SOLN
5.0000 mg | Freq: Once | INTRAVENOUS | Status: AC
Start: 2015-11-26 — End: 2015-11-26
  Administered 2015-11-26: 5 mg via INTRAVENOUS
  Filled 2015-11-26: qty 5

## 2015-11-26 MED ORDER — MAGNESIUM SULFATE 50 % IJ SOLN
40.0000 meq | INTRAMUSCULAR | Status: DC
  Filled 2015-11-26: qty 10

## 2015-11-26 MED ORDER — SODIUM CHLORIDE 0.9% FLUSH: 3.0000 mL | Freq: Two times a day (BID) | INTRAVENOUS | Status: DC

## 2015-11-26 MED ORDER — HEPARIN SODIUM (PORCINE) 1000 UNIT/ML IJ SOLN
INTRAMUSCULAR | Status: DC | PRN
  Administered 2015-11-26: 5000 [IU] via INTRAVENOUS

## 2015-11-26 MED ORDER — CHLORHEXIDINE GLUCONATE 0.12 % MT SOLN
15.0000 mL | Freq: Once | OROMUCOSAL | Status: AC
  Administered 2015-11-27: 15 mL via OROMUCOSAL
  Filled 2015-11-26: qty 15

## 2015-11-26 MED ORDER — SODIUM CHLORIDE 0.9 % IV SOLN
INTRAVENOUS | Status: DC
  Filled 2015-11-26: qty 2.5

## 2015-11-26 MED ORDER — HEPARIN (PORCINE) IN NACL 100-0.45 UNIT/ML-% IJ SOLN
1600.0000 [IU]/h | INTRAMUSCULAR | Status: DC
  Administered 2015-11-26 – 2015-11-27 (×2): 1400 [IU]/h via INTRAVENOUS
  Filled 2015-11-26: qty 250

## 2015-11-26 MED ORDER — SODIUM CHLORIDE 0.9 % WEIGHT BASED INFUSION: 1.0000 mL/kg/h | INTRAVENOUS | Status: DC

## 2015-11-26 MED ORDER — DOPAMINE-DEXTROSE 3.2-5 MG/ML-% IV SOLN
0.0000 ug/kg/min | INTRAVENOUS | Status: DC
Start: 2015-11-27 — End: 2015-11-27
  Filled 2015-11-26: qty 250

## 2015-11-26 MED ORDER — HEPARIN (PORCINE) IN NACL 2-0.9 UNIT/ML-% IJ SOLN
INTRAMUSCULAR | Status: AC
Start: 2015-11-26 — End: 2015-11-26
  Filled 2015-11-26: qty 1500

## 2015-11-26 MED ORDER — METOPROLOL TARTRATE 12.5 MG HALF TABLET: 12.5000 mg | ORAL_TABLET | Freq: Once | ORAL | Status: DC

## 2015-11-26 MED ORDER — OXYCODONE-ACETAMINOPHEN 5-325 MG PO TABS
1.0000 | ORAL_TABLET | ORAL | Status: DC | PRN
  Administered 2015-11-26: 1 via ORAL
  Filled 2015-11-26: qty 1

## 2015-11-26 MED ORDER — HEPARIN (PORCINE) IN NACL 2-0.9 UNIT/ML-% IJ SOLN
INTRAMUSCULAR | Status: DC | PRN
  Administered 2015-11-26: 1500 mL

## 2015-11-26 MED ORDER — VANCOMYCIN HCL 10 G IV SOLR
1500.0000 mg | INTRAVENOUS | Status: DC
  Filled 2015-11-26: qty 1500

## 2015-11-26 MED ORDER — ALBUTEROL SULFATE (2.5 MG/3ML) 0.083% IN NEBU
2.5000 mg | INHALATION_SOLUTION | Freq: Once | RESPIRATORY_TRACT | Status: AC
  Administered 2015-11-26: 2.5 mg via RESPIRATORY_TRACT

## 2015-11-26 MED ORDER — NITROGLYCERIN IN D5W 200-5 MCG/ML-% IV SOLN
0.0000 ug/min | INTRAVENOUS | Status: DC
  Administered 2015-11-26: 5 ug/min via INTRAVENOUS

## 2015-11-26 MED ORDER — FENTANYL CITRATE (PF) 100 MCG/2ML IJ SOLN
INTRAMUSCULAR | Status: AC
  Filled 2015-11-26: qty 2

## 2015-11-26 MED ORDER — LIDOCAINE HCL (PF) 1 % IJ SOLN
INTRAMUSCULAR | Status: DC | PRN
  Administered 2015-11-26: 2 mL

## 2015-11-26 MED ORDER — DEXTROSE 5 % IV SOLN
1.5000 g | INTRAVENOUS | Status: DC
  Filled 2015-11-26: qty 1.5

## 2015-11-26 MED ORDER — DEXMEDETOMIDINE HCL IN NACL 400 MCG/100ML IV SOLN
0.1000 ug/kg/h | INTRAVENOUS | Status: DC
  Filled 2015-11-26 (×2): qty 100

## 2015-11-26 MED ORDER — ASPIRIN 81 MG PO CHEW: 81.0000 mg | CHEWABLE_TABLET | Freq: Every day | ORAL | Status: DC

## 2015-11-26 MED ORDER — ASPIRIN 81 MG PO CHEW: 324.0000 mg | CHEWABLE_TABLET | ORAL | Status: DC

## 2015-11-26 MED ORDER — LIDOCAINE HCL (PF) 1 % IJ SOLN
INTRAMUSCULAR | Status: AC
  Filled 2015-11-26: qty 30

## 2015-11-26 MED ORDER — DEXTROSE 5 % IV SOLN
750.0000 mg | INTRAVENOUS | Status: DC
  Filled 2015-11-26: qty 750

## 2015-11-26 MED ORDER — DEXTROSE 5 % IV SOLN
30.0000 ug/min | INTRAVENOUS | Status: DC
  Filled 2015-11-26: qty 2

## 2015-11-26 MED ORDER — NITROGLYCERIN 0.4 MG SL SUBL
0.4000 mg | SUBLINGUAL_TABLET | SUBLINGUAL | Status: DC | PRN
  Administered 2015-11-26: 0.4 mg via SUBLINGUAL
  Filled 2015-11-26: qty 1

## 2015-11-26 MED ORDER — VERAPAMIL HCL 2.5 MG/ML IV SOLN
INTRAVENOUS | Status: DC | PRN
  Administered 2015-11-26: 10 mL via INTRA_ARTERIAL

## 2015-11-26 MED ORDER — ASPIRIN 81 MG PO CHEW
81.0000 mg | CHEWABLE_TABLET | ORAL | Status: AC
  Administered 2015-11-26: 81 mg via ORAL
  Filled 2015-11-26: qty 1

## 2015-11-26 MED ORDER — IOPAMIDOL (ISOVUE-370) INJECTION 76%
INTRAVENOUS | Status: DC | PRN
  Administered 2015-11-26: 75 mL via INTRAVENOUS

## 2015-11-26 MED ORDER — TEMAZEPAM 15 MG PO CAPS: 15.0000 mg | ORAL_CAPSULE | Freq: Once | ORAL | Status: DC | PRN

## 2015-11-26 MED ORDER — SODIUM CHLORIDE 0.9 % WEIGHT BASED INFUSION
3.0000 mL/kg/h | INTRAVENOUS | Status: DC
  Administered 2015-11-26: 3 mL/kg/h via INTRAVENOUS

## 2015-11-26 MED ORDER — ONDANSETRON HCL 4 MG/2ML IJ SOLN: 4.0000 mg | Freq: Four times a day (QID) | INTRAMUSCULAR | Status: DC | PRN

## 2015-11-26 MED ORDER — NITROGLYCERIN IN D5W 200-5 MCG/ML-% IV SOLN
INTRAVENOUS | Status: AC
  Filled 2015-11-26: qty 250

## 2015-11-26 MED ORDER — EPINEPHRINE HCL 1 MG/ML IJ SOLN
0.0000 ug/min | INTRAVENOUS | Status: DC
  Filled 2015-11-26: qty 4

## 2015-11-26 MED ORDER — ASPIRIN 300 MG RE SUPP: 300.0000 mg | RECTAL | Status: DC

## 2015-11-26 SURGICAL SUPPLY — 10 items
CATH INFINITI 5 FR JL3.5 (CATHETERS) ×3 IMPLANT
CATH INFINITI JR4 5F (CATHETERS) ×3 IMPLANT
DEVICE RAD COMP TR BAND LRG (VASCULAR PRODUCTS) ×3 IMPLANT
GLIDESHEATH SLEND A-KIT 6F 22G (SHEATH) ×3 IMPLANT
GLIDESHEATH SLEND SS 6F .021 (SHEATH) ×3 IMPLANT
KIT HEART LEFT (KITS) ×3 IMPLANT
PACK CARDIAC CATHETERIZATION (CUSTOM PROCEDURE TRAY) ×3 IMPLANT
TRANSDUCER W/STOPCOCK (MISCELLANEOUS) ×3 IMPLANT
TUBING CIL FLEX 10 FLL-RA (TUBING) ×3 IMPLANT
WIRE SAFE-T 1.5MM-J .035X260CM (WIRE) ×3 IMPLANT

## 2015-11-26 NOTE — Progress Notes (Signed)
CRITICAL VALUE ALERT  Critical value received:  Troponin 1.71  Date of notification:  11/26/2015  Time of notification:  0616  Critical value read back:Yes.    Nurse who received alert:  Earmon PhoenixS. Cooper RN  MD notified (1st page):  Azalee CourseHao Meng, PA-on call for Cardiology   Time of first page:  0621  MD notified (2nd page):  Time of second page:  Responding MD:  Azalee CourseHao Meng  Time MD responded:  [phone]

## 2015-11-26 NOTE — Interval H&P Note (Signed)
Cath Lab Visit (complete for each Cath Lab visit)  Clinical Evaluation Leading to the Procedure:   ACS: Yes.    Non-ACS:    Anginal Classification: CCS III  Anti-ischemic medical therapy: Minimal Therapy (1 class of medications)  Non-Invasive Test Results: No non-invasive testing performed  Prior CABG: No previous CABG      History and Physical Interval Note:  11/26/2015 8:03 AM  Justin Villa  has presented today for surgery, with the diagnosis of cp  The various methods of treatment have been discussed with the patient and family. After consideration of risks, benefits and other options for treatment, the patient has consented to  Procedure(s): Left Heart Cath and Coronary Angiography (N/A) as a surgical intervention .  The patient's history has been reviewed, patient examined, no change in status, stable for surgery.  I have reviewed the patient's chart and labs.  Questions were answered to the patient's satisfaction.     Lyn RecordsHenry W Smith III

## 2015-11-26 NOTE — H&P (View-Only) (Signed)
Interval coverage note:  Patient profile: 50 yo male with PMH of HTN but not on any antihypertensive at home came transferred from [facility] with unstable angina symptom for the past week and BP 190/120. [facility] at [facility] showed subtle aVR elevation with diffuse ST depression concerning for high grade stenosis vs demand ischemia from accelerated HTN. Plan for cath   Subjective: Symptom onset for a week, last episode of CP was last night   Physical exam: General: A&O x3, NAD Cardiac: RRR, no murmur Lung: clear to auscultation Abdomen: soft, nontender Extremities: warm to touch   Plan:   1. NSTEMI:  - EKG changes include subtle aVR elevation with diffuse ST depression [facility].  - trop 0 --> 1.71. Higher than expected given HTN alone  - patient states he actually has early FHx of CAD with his father having first MI in his 7453-52 years of age, other risk factor include uncontrolled HTN  - As plan laid out by overnight fellow, plan for cath today. Risk and benefit of procedure explained to the patient who display clear understanding and agree to proceed. Discussed with patient possible procedural risk include bleeding, vascular injury, renal injury, arrythmia, MI, stroke and loss of limb or life.   - he will likely followup in [place] office  2. Uncontrolled HTN: 190/120, coreg started yesterday, lisinopril will be started today   Ramond DialSigned, Jessee Mezera PA Pager: [phone]

## 2015-11-26 NOTE — Consult Note (Signed)
[address]       [place] [address]             [phone]        Justin Villa [place] Medical Record #[id number] Date of Birth: Nov 18, 1965  Referring: Skains/Smith Primary Care: No primary care provider on file.  Chief Complaint:    Chief Complaint  Patient presents with  . Chest Pain    History of Present Illness:      Justin Villa is a 50 yo obese male with known history of Hypertension, however he is not taking medications.  He was transferred to [facility] after presenting to [facility] with complaints of unstable angina over the last week.  Workup in the ED showed subtle ST depression which is concerning for high grade stenosis vs. demand ischemia from accelerated HTN.  His Troponin level was also mildly elevated.  He also has a positive family history of CAD.  He underwent cardiac catheterization which showed severe 3 vessel CAD with a preserved EF.  It was felt coronary bypass grafting would be indicated and TCTS consult was obtained.  He was placed on Heparin and NTG and admitted for further care.  Currently the patient denies chest pain.  He states his main symptom has been shortness of breath which has been occurring for a while.  He does not smoke and denies a history of DM.   Current Activity/ Functional Status: Patient is independent with mobility/ambulation, transfers, ADL's, IADL's.   Zubrod Score: At the time of surgery this patient's most appropriate activity status/level should be described as: []     0    Normal activity, no symptoms [x]     1    Restricted in physical strenuous activity but ambulatory, able to do out light work []     2    Ambulatory and capable of self care, unable to do work activities, up and about                 more than 50%  Of the time                            []     3    Only limited self care, in bed greater than 50% of waking hours []     4    Completely disabled, no self care, confined to bed or  chair []     5    Moribund  Past Medical History  Diagnosis Date  . Hypertension     Past Surgical History  Procedure Laterality Date  . Back surgery    . Knee surgery      History  Smoking status  . Never Smoker   Smokeless tobacco  . Not on file    History  Alcohol Use No    Social History   Social History  . Marital Status: Married    Spouse Name: N/A  . Number of Children: N/A  . Years of Education: N/A   Occupational History  . Not on file.   Social History Main Topics  . Smoking status: Never Smoker   . Smokeless tobacco: Not on file  . Alcohol Use: No  . Drug Use: No  . Sexual Activity: Not on file   Other Topics Concern  . Not on file   Social History Narrative    No Known Allergies  Current Facility-Administered Medications  Medication  Dose Route Frequency Provider Last Rate Last Dose  . 0.9 %  sodium chloride infusion  250 mL Intravenous PRN Lyn Records, MD      . acetaminophen (TYLENOL) tablet 650 mg  650 mg Oral Q4H PRN Jeffie Pollock, MD      . acetaminophen (TYLENOL) tablet 650 mg  650 mg Oral Q4H PRN Lyn Records, MD   650 mg at 11/26/15 1042  . [START ON 11/27/2015] aspirin chewable tablet 81 mg  81 mg Oral Daily Lyn Records, MD      . atorvastatin (LIPITOR) tablet 80 mg  80 mg Oral q1800 Jeffie Pollock, MD      . carvedilol (COREG) tablet 12.5 mg  12.5 mg Oral BID WC Jeffie Pollock, MD   12.5 mg at 11/26/15 0731  . heparin ADULT infusion 100 units/mL (25000 units/222mL sodium chloride 0.45%)  1,400 Units/hr Intravenous Continuous Norva Pavlov, RPH      . lisinopril (PRINIVIL,ZESTRIL) tablet 10 mg  10 mg Oral Daily Jeffie Pollock, MD   10 mg at 11/26/15 0926  . nitroGLYCERIN (NITROSTAT) SL tablet 0.4 mg  0.4 mg Sublingual Q5 min PRN [name], MD   0.4 mg at 11/26/15 0011  . nitroGLYCERIN 50 mg in dextrose 5 % 250 mL (0.2 mg/mL) infusion  0-200 mcg/min Intravenous Titrated [name], MD 4.5 mL/hr at 11/26/15  0143 15 mcg/min at 11/26/15 0143  . ondansetron (ZOFRAN) injection 4 mg  4 mg Intravenous Q6H PRN Lyn Records, MD      . oxyCODONE-acetaminophen (PERCOCET/ROXICET) 5-325 MG per tablet 1-2 tablet  1-2 tablet Oral Q4H PRN Lyn Records, MD      . sodium chloride flush (NS) 0.9 % injection 3 mL  3 mL Intravenous Q12H Lyn Records, MD      . sodium chloride flush (NS) 0.9 % injection 3 mL  3 mL Intravenous PRN Lyn Records, MD        No prescriptions prior to admission    No family history on file.  Review of Systems:  Constitutional: negative Eyes: negative Cardiovascular: positive for dyspnea and exertional chest pressure/discomfort Gastrointestinal: negative Integument/breast: negative Musculoskeletal:negative Neurological: negative     Cardiac Review of Systems: Y or N  Chest Pain [ y   ]  Resting SOB [   ] Exertional SOB  Cove.Etienne  ]  Orthopnea [  ]   Pedal Edema [   ]    Palpitations [  ] Syncope  [  ]   Presyncope [   ]  General Review of Systems: [Y] = yes [  ]=no Constitional: recent weight change [  ]; anorexia [  ]; fatigue [  ]; nausea [ n ]; night sweats [  ]; fever [  ]; or chills [ n ]                                                               Dental: poor dentition[  ]; Last Dentist visit:   Eye : blurred vision [  ]; diplopia [   ]; vision changes [  ];  Amaurosis fugax[  ]; Resp: cough [  ];  wheezing[  ];  hemoptysis[  ]; shortness of breath[y  ]; paroxysmal nocturnal  dyspnea[  ]; dyspnea on exertion[y  ]; or orthopnea[  ];  GI:  gallstones[  ], vomiting[  ];  dysphagia[  ]; melena[  ];  hematochezia [  ]; heartburn[  ];   Hx of  Colonoscopy[  ]; GU: kidney stones [  ]; hematuria[  ];   dysuria [  ];  nocturia[  ];  history of     obstruction [  ]; urinary frequency [  ]             Skin: rash, swelling[  ];, hair loss[  ];  peripheral edema[  ];  or itching[  ]; Musculosketetal: myalgias[  ];  joint swelling[  ];  joint erythema[  ];  joint pain[  ];  back pain[   ];  Heme/Lymph: bruising[  ];  bleeding[  ];  anemia[  ];  Neuro: TIA[  ];  headaches[  ];  stroke[  ];  vertigo[  ];  seizures[  ];   paresthesias[  ];  difficulty walking[  ];  Psych:depression[  ]; anxiety[  ];  Endocrine: diabetes[ n ];  thyroid dysfunction[  ];  Immunizations: Flu [  ]; Pneumococcal[  ];  Other:  Physical Exam: BP 130/69 mmHg  Pulse 62  Temp(Src) 97.9 F (36.6 C) (Oral)  Resp 22  Ht 6' (1.829 m)  Wt 233 lb 11.2 oz (106.006 kg)  BMI 31.69 kg/m2  SpO2 98%   General appearance: alert, cooperative, no distress and mildly obese Head: Normocephalic, without obvious abnormality, atraumatic Lymph nodes: Cervical, supraclavicular, and axillary nodes normal. Resp: clear to auscultation bilaterally Cardio: regular rate and rhythm GI: soft, non-tender; bowel sounds normal; no masses,  no organomegaly Extremities: extremities normal, atraumatic, no cyanosis or edema Neurologic: Grossly normal  Diagnostic Studies & Laboratory data:     Recent Radiology Findings:   Dg Chest Port 1 View  11/26/2015  CLINICAL DATA:  Acute onset of moderate sharp left-sided chest pain, radiating to the left arm. Shortness of breath. Initial encounter. EXAM: PORTABLE CHEST 1 VIEW COMPARISON:  Chest radiograph performed 01/03/2015 FINDINGS: The lungs are well-aerated. Pulmonary vascularity is at the upper limits of normal. There is no evidence of focal opacification, pleural effusion or pneumothorax. The cardiomediastinal silhouette is borderline normal in size. No acute osseous abnormalities are seen. IMPRESSION: No acute cardiopulmonary process seen. Electronically Signed   By: Roanna RaiderJeffery  Chang M.D.   On: 11/26/2015 00:40     I have independently reviewed the above radiologic studies.  Recent Lab Findings: Lab Results  Component Value Date   WBC 7.0 11/26/2015   HGB 13.1 11/26/2015   HCT 38.2* 11/26/2015   PLT 221 11/26/2015   GLUCOSE 101* 11/26/2015   CHOL 158 11/26/2015   TRIG 135  11/26/2015   HDL 35* 11/26/2015   LDLCALC 96 11/26/2015   ALT 46 11/26/2015   AST 28 11/26/2015   NA 138 11/26/2015   K 3.9 11/26/2015   CL 106 11/26/2015   CREATININE 1.01 11/26/2015   BUN 14 11/26/2015   CO2 25 11/26/2015   INR 1.26 11/26/2015    CATH:  Lyn RecordsHenry W Smith, MD (Primary)      Procedures    Left Heart Cath and Coronary Angiography    Conclusion    1. Mid RCA lesion, 85% stenosed. 2. 1st Mrg lesion, 85% stenosed. 3. Ost Cx to Mid Cx lesion, 95% stenosed. 4. Ost 2nd Mrg to 2nd Mrg lesion, 90% stenosed. 5. Mid Cx to Dist Cx lesion,  65% stenosed. 6. Ost 1st Diag to 1st Diag lesion, 70% stenosed. 7. Ost 2nd Diag to 2nd Diag lesion, 95% stenosed. 8. Mid LAD to Dist LAD lesion, 100% stenosed. 9. Ost LAD to Mid LAD lesion, 50% stenosed. 10. Dist RCA lesion, 75% stenosed. 11. RPDA-1 lesion, 40% stenosed. 12. RPDA-2 lesion, 50% stenosed. 13. 2nd RPLB lesion, 50% stenosed.   Severe three-vessel coronary disease as outlined above with totally occluded mid LAD high-grade obstruction in large diagonal branches, high-grade obstruction in the proximal circumflex in each obtuse marginal branch, and high-grade obstruction in the mid and distal RCA. The RCA supplies the LAD via septal perforator collaterals.  Normal left ventricular systolic function. Normal left ventricular filling pressures.   RECOMMENDATIONS:   Cardiovascular surgical evaluation for multivessel bypass surgery.  Resume heparin.  Remain hospitalized until surgery can be performed.    ECHO:   Assessment / Plan:       1. CAD- needs CABG, with severe three vessel disease and unstable symptoms agree with  Dr Katrinka BlazingSmith coronary artery bypass is best option for treatment.  2. HTN- currently on Coreg and Lisinopril 3. Pre operative CABG testing ordered 4. Dispo- patient on Heparin, NTG, currently chest pain free... For CABG in AM  The goals risks and alternatives of the planned surgical procedure  CABG  have been discussed with the patient in detail. The risks of the procedure including death, infection, stroke, myocardial infarction, bleeding, blood transfusion have all been discussed specifically.  I have quoted Justin Villa a 2 % of perioperative mortality and a complication rate as high as 15%. The patient's questions have been answered.Justin Villa is willing  to proceed with the planned procedure.  In addition to other potential risks and complications from the surgery, I have made the patient aware of the recent [facility] Advisory concerning the risk of infection by Myocobacterium chimaera related to the use of Stockert 3T heater-cooler equipment during cardiac surgery. I discussed with the patient the low risk of infection, as well as our compliance with the most current FDA recommendations to minimize infection and testing of all devices for contamination. The patient has been made aware of the limited alternatives to immediately replacing the current equipment. The patient has been informed regarding the risks associated with waiting to proceed with needed surgery and that such risks are greater than the risk of infection related to the use of the heater-cooler device. I did make the patient aware that after careful review of the patients [facility] we have no evidence that heater/cooler related infections have occurred at [facility]. We discussed that this is a slow-growing bacterium, such that it can take some period of time for symptoms to develop.   I  spent 40 minutes counseling the patient face to face and 50% or more the  time was spent in counseling and coordination of care. The total time spent in the appointment was 60 minutes.   Delight OvensEdward B Tye Vigo MD      [address] [place] [id number] Office [phone]   Beeper [phone]

## 2015-11-26 NOTE — Anesthesia Preprocedure Evaluation (Addendum)
Anesthesia Evaluation  Patient identified by MRN, date of birth, ID band Patient awake    Reviewed: Allergy & Precautions, NPO status , Patient's Chart, lab work & pertinent test results  Airway Mallampati: III  TM Distance: >3 FB Neck ROM: Full    Dental no notable dental hx. (+) Teeth Intact, Dental Advisory Given   Pulmonary neg pulmonary ROS,    Pulmonary exam normal breath sounds clear to auscultation       Cardiovascular hypertension, Pt. on medications + CAD  Normal cardiovascular exam Rhythm:Regular Rate:Normal     Neuro/Psych negative neurological ROS  negative psych ROS   GI/Hepatic negative GI ROS, Neg liver ROS,   Endo/Other  negative endocrine ROS  Renal/GU negative Renal ROS     Musculoskeletal negative musculoskeletal ROS (+)   Abdominal   Peds  Hematology negative hematology ROS (+)   Anesthesia Other Findings   Reproductive/Obstetrics                           Anesthesia Physical Anesthesia Plan  ASA: IV  Anesthesia Plan: General   Post-op Pain Management:    Induction: Intravenous  Airway Management Planned: Oral ETT  Additional Equipment: Arterial line, PA Cath, TEE, CVP and Ultrasound Guidance Line Placement  Intra-op Plan:   Post-operative Plan: Post-operative intubation/ventilation  Informed Consent: I have reviewed the patients History and Physical, chart, labs and discussed the procedure including the risks, benefits and alternatives for the proposed anesthesia with the patient or authorized representative who has indicated his/her understanding and acceptance.   Dental advisory given  Plan Discussed with: CRNA  Anesthesia Plan Comments:         Anesthesia Quick Evaluation

## 2015-11-26 NOTE — Progress Notes (Addendum)
Interval coverage note:  Patient profile: 50 yo male with PMH of HTN but not on any antihypertensive at home [facility] with unstable angina symptom for the past week and BP 190/120. [facility] showed subtle aVR elevation with diffuse ST depression concerning for high grade stenosis vs demand ischemia from accelerated HTN. Plan for cath   Subjective: Symptom onset for a week, last episode of CP was last night   Physical exam: General: A&O x3, NAD Cardiac: RRR, no murmur Lung: clear to auscultation Abdomen: soft, nontender Extremities: warm to touch   Plan:   1. NSTEMI:  - EKG changes include subtle aVR elevation with diffuse ST depression [facility].  - trop 0 --> 1.71. Higher than expected given HTN alone  - patient states he actually has early FHx of CAD with his father having first MI in his 7153-52 years of age, other risk factor include uncontrolled HTN  - As plan laid out by overnight fellow, plan for cath today. Risk and benefit of procedure explained to the patient who display clear understanding and agree to proceed. Discussed with patient possible procedural risk include bleeding, vascular injury, renal injury, arrythmia, MI, stroke and loss of limb or life.   - he will likely followup in [place] office  2. Uncontrolled HTN: 190/120, coreg started yesterday, lisinopril will be started today   Ramond DialSigned, Hao Meng PA Pager: [phone]  Scheduled Meds: . Melene Muller[START ON 11/27/2015] aspirin  81 mg Oral Daily  . atorvastatin  80 mg Oral q1800  . carvedilol  12.5 mg Oral BID WC  . lisinopril  10 mg Oral Daily  . sodium chloride flush  3 mL Intravenous Q12H   Continuous Infusions: . sodium chloride 3 mL/kg/hr (11/26/15 0926)  . heparin    . nitroGLYCERIN 15 mcg/min (11/26/15 0143)   PRN Meds:.sodium chloride, acetaminophen, acetaminophen, nitroGLYCERIN, ondansetron (ZOFRAN) IV, oxyCODONE-acetaminophen, sodium chloride  flush   Personally seen and examined. Agree with above. Spoke with he and wife.  CABG consult. HTN control.  Donato SchultzMark Cyanna Neace, MD

## 2015-11-26 NOTE — Progress Notes (Signed)
Briefly discussed sternal precautions, mobility, and d/c planning with pt (in the process of PFTS and vascular tests). Gave [facility] booklet, guideline, and video to watch.  [phone] Ethelda ChickKristan Alecxis Baltzell CES, ACSM 2:58 PM 11/26/2015

## 2015-11-26 NOTE — Progress Notes (Signed)
ANTICOAGULATION CONSULT NOTE - Preliminary  Pharmacy Consult for heparin Indication: chest pain/ACS  No Known Allergies  Patient Measurements: Height: 6' (182.9 cm) Weight: 230 lb (104.327 kg) IBW/kg (Calculated) : 77.6 HEPARIN DW (KG): 99.2   Vital Signs: Temp: 97.9 F (36.6 C) (06/22 0015) Temp Source: Oral (06/22 0015) BP: 198/124 mmHg (06/22 0015) Pulse Rate: 104 (06/22 0015)  Labs:  Recent Labs  11/26/15 0010  HGB 15.0  HCT 41.3  PLT 244   CrCl cannot be calculated (Patient has no serum creatinine result on file.).  Medical History: History reviewed. No pertinent past medical history.  Medications:   (Not in a hospital admission) Scheduled:  . heparin  4,000 Units Intravenous Once  . nitroGLYCERIN       Infusions:  . heparin    . nitroGLYCERIN 5 mcg/min (11/26/15 0036)   PRN: nitroGLYCERIN Anti-infectives    None      Assessment: 50 yo male with L side chest pain radiating to left arm. No bleeding, plt ok, INR, PTT pending.    Goal of Therapy:  Heparin level 0.3-0.7 units/ml   Plan:  Give 4000 units bolus x 1 Start heparin infusion at 1300 units/hr Check anti-Xa level in 6 hours and daily while on heparin Continue to monitor H&H and platelets Preliminary review of pertinent patient information completed.  [facility] clinical pharmacist will complete review during morning rounds to assess the patient and finalize treatment regimen.  Fatisha Rabalais, Berneice Heinrichiffany Scarlett, RPH 11/26/2015,12:31 AM

## 2015-11-26 NOTE — Progress Notes (Signed)
ANTICOAGULATION CONSULT NOTE - Initial Consult  Pharmacy Consult for Heparin Indication: 3VCAD  No Known Allergies  Patient Measurements: Height: 6' (182.9 cm) Weight: 233 lb 11.2 oz (106.006 kg) IBW/kg (Calculated) : 77.6 Heparin Dosing Weight: 99.7 kg  Vital Signs: Temp: 98.3 F (36.8 C) (06/22 0420) Temp Source: Oral (06/22 0420) BP: 155/103 mmHg (06/22 0930) Pulse Rate: 74 (06/22 0930)  Labs:  Recent Labs  11/26/15 0010 11/26/15 0037 11/26/15 0430 11/26/15 0436  HGB 15.0  --  13.1  --   HCT 41.3  --  38.2*  --   PLT 244  --  221  --   APTT  --  33  --   --   LABPROT  --  16.0*  --   --   INR  --  1.26  --   --   CREATININE 0.98  --  1.01  --   TROPONINI <0.03  --   --  1.71*    Estimated Creatinine Clearance: 107.7 mL/min (by C-G formula based on Cr of 1.01).   Medical History: Past Medical History  Diagnosis Date  . Hypertension     Medications:  No prescriptions prior to admission    Assessment: Tx from [facility] on heparin for CP x 1 week, elevated troponins, uncontrolled HTN.  PMH: uncontrolled HTN on no medications PTA  Anticoagulation: Severe 3VCAD. Radial sheath out 0850. CBC WNL  Cardiovascular: 6/22: Severe 3VCAD, normal LV systolic function TC 158, TG 135, HDL 35, LDL 96   Goal of Therapy:  Heparin level 0.3-0.7 units/ml Monitor platelets by anticoagulation protocol: Yes   Plan:  At 1650 start IV heparin (no bolus) at 1400 units/hr Will check HL 6-8 hrs later Daily HL and CBC F/u CVTS consult    Girtrude Enslin S. Merilynn Finlandobertson, PharmD, BCPS Clinical Staff Pharmacist Pager [phone]  Misty Stanleyobertson, Cyann Venti Stillinger 11/26/2015,9:46 AM

## 2015-11-26 NOTE — Plan of Care (Signed)
Problem: Education: Goal: Knowledge of [facility] Education information/materials will improve Outcome: Progressing RN has paged Cardiology (service admitting patient) to notify patient has arrived.  Patient denying chest pain at this time, is complaining of a 2/10 headache; Cardiology paged with this information.

## 2015-11-26 NOTE — H&P (Signed)
CARDIOLOGY ADMISSION NOTE  Assessment and Plan:  *Unstable angina: Mr. Justin Villa is a pleasant 50 year old male with history of hypertension and no early family history of coronary artery disease comes to the emergency department with symptoms of unstable angina. Patient endorses one week of progressive substernal angina with exertion and resolution with rest. In addition, admission EKG at [facility] shows subtle aVR elevations with diffuse ST depressions concerning for high-grade stenosis versus demand ischemia from accelerated hypertension. - Continue aspirin 81 mg daily - Continue heparin drip ACS nomogram - Continue cycle troponins 3 - Repeat EKG now. - Start atorvastatin 80 mg daily - Check fasting lipid panel and hemoglobin A1c - Plan for left heart catheterization in the morning  *Hypertension: Not on any antihypertensives at home. The pressure is better with 15 g of nitroglycerin - Start carvedilol 12.5 mg twice a day - Likely will need a second agent such an ACE inhibitor. We'll start that in the morning.  Chief complaint: Chest pressure   HPI:  Mr. Justin Villa is a pleasant 50 year old male with history of hypertension, family history of early coronary artery disease comes to the emergency department with chest pressure. Patient states that he was doing fairly well up until about a week ago when he initially came to see his follow along the hospital. Coming from the parking lot to the room, patient had to stop because of substernal chest pressure with associated shortness of breath. Since then, patient has started noticing exertional chest tightness with mild shortness of breath with ambulation. He states that the pressure tends to go away with rest. He feels that over past one week the episodes are happening more frequently. He had again an episode this evening, which has been the most severe episode so far which brought him to the hospital. [facility] department. In the ED, he was noted to have very elevated blood pressure of 190s over 120s. His EKG showed concerning ST depressions in multiple leads. He was started on nitro drip and a heparin ACS nomogram. With that, patient endorses complete resolution of his pain at the moment. Patient denies any orthopnea, PND, lower extremity edema. Patient denies any previous cardiac workup.  Cardiac history:   Previous cardiac imaging  EKG: Normal sinus rhythm with elevations in aVR and diffuse ST depressions  TTE: None  NM Stress test: None  Prior cath:  None Past Medical History History reviewed. No pertinent past medical history.  Allergies: No Known Allergies  Social History Social History   Social History  . Marital Status: Married    Spouse Name: N/A  . Number of Children: N/A  . Years of Education: N/A   Occupational History  . Not on file.   Social History Main Topics  . Smoking status: Never Smoker   . Smokeless tobacco: Not on file  . Alcohol Use: No  . Drug Use: No  . Sexual Activity: Not on file   Other Topics Concern  . Not on file   Social History Narrative    Family History Father: MI in 10450s Uncle: MI in early 7150s  Physical Exam Filed Vitals:   11/26/15 0115 11/26/15 0234  BP: 154/122 158/104  Pulse: 79 70  Temp:  97.8 F (36.6 C)  Resp: 19 21     Gen: Comfortable appearing in no acute distress HEENT: EOMI, PERRLA Neck: Supple, No JVD, No carotid bruits CV: RRR, Normal S1,S2 and + S4, Displaced  PMI, +2 pulses in bilateral upper extremities Pulm: CTAB, Normal work of breathing Abdomen: Soft, obese, nontender, nondistended Ext: No c/c/e Neuro: No focal deficits,Normal strength in all 4 extremities, normal sensation throughout  Labs:  Results for orders placed or performed during the hospital encounter of 11/25/15 (from the past 24 hour(s))  CBC with Differential/Platelet     Status: Abnormal   Collection Time:  11/26/15 12:10 AM  Result Value Ref Range   WBC 8.0 4.0 - 10.5 K/uL   RBC 5.05 4.22 - 5.81 MIL/uL   Hemoglobin 15.0 13.0 - 17.0 g/dL   HCT 16.141.3 09.639.0 - 04.552.0 %   MCV 81.8 78.0 - 100.0 fL   MCH 29.7 26.0 - 34.0 pg   MCHC 36.3 (H) 30.0 - 36.0 g/dL   RDW 40.913.7 81.111.5 - 91.415.5 %   Platelets 244 150 - 400 K/uL   Neutrophils Relative % 46 %   Neutro Abs 3.7 1.7 - 7.7 K/uL   Lymphocytes Relative 36 %   Lymphs Abs 2.8 0.7 - 4.0 K/uL   Monocytes Relative 12 %   Monocytes Absolute 0.9 0.1 - 1.0 K/uL   Eosinophils Relative 5 %   Eosinophils Absolute 0.4 0.0 - 0.7 K/uL   Basophils Relative 1 %   Basophils Absolute 0.1 0.0 - 0.1 K/uL  Comprehensive metabolic panel     Status: Abnormal   Collection Time: 11/26/15 12:10 AM  Result Value Ref Range   Sodium 139 135 - 145 mmol/L   Potassium 3.3 (L) 3.5 - 5.1 mmol/L   Chloride 107 101 - 111 mmol/L   CO2 25 22 - 32 mmol/L   Glucose, Bld 108 (H) 65 - 99 mg/dL   BUN 17 6 - 20 mg/dL   Creatinine, Ser 7.820.98 0.61 - 1.24 mg/dL   Calcium 8.8 (L) 8.9 - 10.3 mg/dL   Total Protein 7.2 6.5 - 8.1 g/dL   Albumin 4.6 3.5 - 5.0 g/dL   AST 28 15 - 41 U/L   ALT 46 17 - 63 U/L   Alkaline Phosphatase 55 38 - 126 U/L   Total Bilirubin 1.6 (H) 0.3 - 1.2 mg/dL   GFR calc non Af Amer >60 >60 mL/min   GFR calc Af Amer >60 >60 mL/min   Anion gap 7 5 - 15  Lipase, blood     Status: None   Collection Time: 11/26/15 12:10 AM  Result Value Ref Range   Lipase 23 11 - 51 U/L  Troponin I     Status: None   Collection Time: 11/26/15 12:10 AM  Result Value Ref Range   Troponin I <0.03 <0.031 ng/mL  Brain natriuretic peptide     Status: None   Collection Time: 11/26/15 12:10 AM  Result Value Ref Range   B Natriuretic Peptide 28.0 0.0 - 100.0 pg/mL  I-stat troponin, ED     Status: None   Collection Time: 11/26/15 12:12 AM  Result Value Ref Range   Troponin i, poc 0.01 0.00 - 0.08 ng/mL   Comment 3          Protime-INR     Status: Abnormal   Collection Time: 11/26/15  12:37 AM  Result Value Ref Range   Prothrombin Time 16.0 (H) 11.6 - 15.2 seconds   INR 1.26 0.00 - 1.49  APTT     Status: None   Collection Time: 11/26/15 12:37 AM  Result Value Ref Range   aPTT 33 24 - 37 seconds

## 2015-11-26 NOTE — Progress Notes (Signed)
Pre-op Cardiac Surgery  Carotid Findings:  1-39% ICA plaquing.  Right vertebral flow is atypical.  Left vertebral flow is antegrade.   Upper Extremity Right Left  Brachial Pressures 161T 156T  Radial Waveforms T T  Ulnar Waveforms T T  Palmar Arch (Allen's Test) WNL Doppler signal remains normal with radial compression and obliterates with ulnar compression.   Findings:      Lower  Extremity Right Left  Dorsalis Pedis    Anterior Tibial T T  Posterior Tibial T T  Ankle/Brachial Indices      Findings:

## 2015-11-27 ENCOUNTER — Inpatient Hospital Stay (HOSPITAL_COMMUNITY): Payer: PRIVATE HEALTH INSURANCE

## 2015-11-27 ENCOUNTER — Inpatient Hospital Stay (HOSPITAL_COMMUNITY): Payer: PRIVATE HEALTH INSURANCE | Admitting: Certified Registered Nurse Anesthetist

## 2015-11-27 ENCOUNTER — Other Ambulatory Visit: Payer: Self-pay

## 2015-11-27 ENCOUNTER — Inpatient Hospital Stay (HOSPITAL_COMMUNITY)
Admission: EM | Disposition: A | Discharge status: Home / Self Care | Payer: Self-pay | Source: Home / Self Care | Attending: Cardiothoracic Surgery

## 2015-11-27 DIAGNOSIS — I25110 Atherosclerotic heart disease of native coronary artery with unstable angina pectoris: Secondary | ICD-10-CM

## 2015-11-27 DIAGNOSIS — I2510 Atherosclerotic heart disease of native coronary artery without angina pectoris: Secondary | ICD-10-CM | POA: Diagnosis present

## 2015-11-27 HISTORY — PX: INTRAOPERATIVE TRANSESOPHAGEAL ECHOCARDIOGRAM: SHX5062

## 2015-11-27 HISTORY — DX: Atherosclerotic heart disease of native coronary artery without angina pectoris: I25.10

## 2015-11-27 HISTORY — PX: CORONARY ARTERY BYPASS GRAFT: SHX141

## 2015-11-27 LAB — POCT I-STAT 3, ART BLOOD GAS (G3+)
ACID-BASE DEFICIT: 4 mmol/L — AB (ref 0.0–2.0)
ACID-BASE DEFICIT: 4 mmol/L — AB (ref 0.0–2.0)
Acid-base deficit: 6 mmol/L — ABNORMAL HIGH (ref 0.0–2.0)
BICARBONATE: 25 meq/L — AB (ref 20.0–24.0)
BICARBONATE: 21.7 meq/L (ref 20.0–24.0)
Bicarbonate: 20.1 mEq/L (ref 20.0–24.0)
Bicarbonate: 21.5 mEq/L (ref 20.0–24.0)
O2 SAT: 88 %
O2 SAT: 97 %
O2 SAT: 91 %
O2 Saturation: 100 %
PH ART: 7.32 — AB (ref 7.350–7.450)
PH ART: 7.325 — AB (ref 7.350–7.450)
PO2 ART: 68 mmHg — AB (ref 80.0–100.0)
TCO2: 26 mmol/L (ref 0–100)
TCO2: 21 mmol/L (ref 0–100)
TCO2: 23 mmol/L (ref 0–100)
TCO2: 23 mmol/L (ref 0–100)
pCO2 arterial: 42.5 mmHg (ref 35.0–45.0)
pCO2 arterial: 38.9 mmHg (ref 35.0–45.0)
pCO2 arterial: 42.2 mmHg (ref 35.0–45.0)
pCO2 arterial: 41.5 mmHg (ref 35.0–45.0)
pH, Arterial: 7.377 (ref 7.350–7.450)
pH, Arterial: 7.32 — ABNORMAL LOW (ref 7.350–7.450)
pO2, Arterial: 259 mmHg — ABNORMAL HIGH (ref 80.0–100.0)
pO2, Arterial: 58 mmHg — ABNORMAL LOW (ref 80.0–100.0)
pO2, Arterial: 101 mmHg — ABNORMAL HIGH (ref 80.0–100.0)

## 2015-11-27 LAB — CBC
HCT: 34.1 % — ABNORMAL LOW (ref 39.0–52.0)
HCT: 33.8 % — ABNORMAL LOW (ref 39.0–52.0)
HEMATOCRIT: 37.2 % — AB (ref 39.0–52.0)
Hemoglobin: 12.7 g/dL — ABNORMAL LOW (ref 13.0–17.0)
Hemoglobin: 11.9 g/dL — ABNORMAL LOW (ref 13.0–17.0)
Hemoglobin: 11.6 g/dL — ABNORMAL LOW (ref 13.0–17.0)
MCH: 28.3 pg (ref 26.0–34.0)
MCH: 28.5 pg (ref 26.0–34.0)
MCH: 28.2 pg (ref 26.0–34.0)
MCHC: 34.1 g/dL (ref 30.0–36.0)
MCHC: 34.9 g/dL (ref 30.0–36.0)
MCHC: 34.3 g/dL (ref 30.0–36.0)
MCV: 82.9 fL (ref 78.0–100.0)
MCV: 81.6 fL (ref 78.0–100.0)
MCV: 82.2 fL (ref 78.0–100.0)
PLATELETS: 182 10*3/uL (ref 150–400)
PLATELETS: 121 10*3/uL — AB (ref 150–400)
Platelets: 128 10*3/uL — ABNORMAL LOW (ref 150–400)
RBC: 4.49 MIL/uL (ref 4.22–5.81)
RBC: 4.18 MIL/uL — AB (ref 4.22–5.81)
RBC: 4.11 MIL/uL — ABNORMAL LOW (ref 4.22–5.81)
RDW: 13.6 % (ref 11.5–15.5)
RDW: 13.5 % (ref 11.5–15.5)
RDW: 13.6 % (ref 11.5–15.5)
WBC: 5.7 10*3/uL (ref 4.0–10.5)
WBC: 9.6 10*3/uL (ref 4.0–10.5)
WBC: 8.9 10*3/uL (ref 4.0–10.5)

## 2015-11-27 LAB — HEMOGLOBIN A1C
HEMOGLOBIN A1C: 5.6 % (ref 4.8–5.6)
MEAN PLASMA GLUCOSE: 114 mg/dL

## 2015-11-27 LAB — BASIC METABOLIC PANEL
Anion gap: 5 (ref 5–15)
BUN: 13 mg/dL (ref 6–20)
CO2: 24 mmol/L (ref 22–32)
Calcium: 8.5 mg/dL — ABNORMAL LOW (ref 8.9–10.3)
Chloride: 109 mmol/L (ref 101–111)
Creatinine, Ser: 0.99 mg/dL (ref 0.61–1.24)
GFR calc Af Amer: >60 mL/min (ref >60)
GFR calc non Af Amer: >60 mL/min (ref >60)
Glucose, Bld: 114 mg/dL — ABNORMAL HIGH (ref 65–99)
Potassium: 3.8 mmol/L (ref 3.5–5.1)
Sodium: 138 mmol/L (ref 135–145)

## 2015-11-27 LAB — POCT I-STAT, CHEM 8
BUN: 13 mg/dL (ref 6–20)
BUN: 11 mg/dL (ref 6–20)
BUN: 13 mg/dL (ref 6–20)
BUN: 12 mg/dL (ref 6–20)
BUN: 12 mg/dL (ref 6–20)
BUN: 11 mg/dL (ref 6–20)
BUN: 11 mg/dL (ref 6–20)
CALCIUM ION: 1.22 mmol/L (ref 1.12–1.23)
CALCIUM ION: 1.2 mmol/L (ref 1.12–1.23)
CALCIUM ION: 1.1 mmol/L — AB (ref 1.12–1.23)
CALCIUM ION: 1.16 mmol/L (ref 1.12–1.23)
CHLORIDE: 105 mmol/L (ref 101–111)
CHLORIDE: 104 mmol/L (ref 101–111)
CHLORIDE: 104 mmol/L (ref 101–111)
CREATININE: 0.7 mg/dL (ref 0.61–1.24)
CREATININE: 0.8 mg/dL (ref 0.61–1.24)
CREATININE: 0.9 mg/dL (ref 0.61–1.24)
Calcium, Ion: 1.06 mmol/L — ABNORMAL LOW (ref 1.12–1.23)
Calcium, Ion: 1.1 mmol/L — ABNORMAL LOW (ref 1.12–1.23)
Calcium, Ion: 1.04 mmol/L — ABNORMAL LOW (ref 1.12–1.23)
Chloride: 100 mmol/L — ABNORMAL LOW (ref 101–111)
Chloride: 105 mmol/L (ref 101–111)
Chloride: 102 mmol/L (ref 101–111)
Chloride: 103 mmol/L (ref 101–111)
Creatinine, Ser: 0.8 mg/dL (ref 0.61–1.24)
Creatinine, Ser: 0.8 mg/dL (ref 0.61–1.24)
Creatinine, Ser: 0.7 mg/dL (ref 0.61–1.24)
Creatinine, Ser: 0.8 mg/dL (ref 0.61–1.24)
GLUCOSE: 113 mg/dL — AB (ref 65–99)
GLUCOSE: 117 mg/dL — AB (ref 65–99)
GLUCOSE: 124 mg/dL — AB (ref 65–99)
GLUCOSE: 145 mg/dL — AB (ref 65–99)
GLUCOSE: 151 mg/dL — AB (ref 65–99)
Glucose, Bld: 154 mg/dL — ABNORMAL HIGH (ref 65–99)
Glucose, Bld: 132 mg/dL — ABNORMAL HIGH (ref 65–99)
HCT: 35 % — ABNORMAL LOW (ref 39.0–52.0)
HCT: 25 % — ABNORMAL LOW (ref 39.0–52.0)
HCT: 32 % — ABNORMAL LOW (ref 39.0–52.0)
HCT: 27 % — ABNORMAL LOW (ref 39.0–52.0)
HCT: 27 % — ABNORMAL LOW (ref 39.0–52.0)
HCT: 33 % — ABNORMAL LOW (ref 39.0–52.0)
HEMATOCRIT: 21 % — AB (ref 39.0–52.0)
HEMOGLOBIN: 8.5 g/dL — AB (ref 13.0–17.0)
HEMOGLOBIN: 9.2 g/dL — AB (ref 13.0–17.0)
HEMOGLOBIN: 9.2 g/dL — AB (ref 13.0–17.0)
HEMOGLOBIN: 7.1 g/dL — AB (ref 13.0–17.0)
Hemoglobin: 11.9 g/dL — ABNORMAL LOW (ref 13.0–17.0)
Hemoglobin: 10.9 g/dL — ABNORMAL LOW (ref 13.0–17.0)
Hemoglobin: 11.2 g/dL — ABNORMAL LOW (ref 13.0–17.0)
POTASSIUM: 4 mmol/L (ref 3.5–5.1)
POTASSIUM: 5.1 mmol/L (ref 3.5–5.1)
POTASSIUM: 3.7 mmol/L (ref 3.5–5.1)
Potassium: 4.1 mmol/L (ref 3.5–5.1)
Potassium: 4.2 mmol/L (ref 3.5–5.1)
Potassium: 5.3 mmol/L — ABNORMAL HIGH (ref 3.5–5.1)
Potassium: 4.2 mmol/L (ref 3.5–5.1)
SODIUM: 135 mmol/L (ref 135–145)
SODIUM: 136 mmol/L (ref 135–145)
Sodium: 140 mmol/L (ref 135–145)
Sodium: 137 mmol/L (ref 135–145)
Sodium: 138 mmol/L (ref 135–145)
Sodium: 137 mmol/L (ref 135–145)
Sodium: 139 mmol/L (ref 135–145)
TCO2: 24 mmol/L (ref 0–100)
TCO2: 26 mmol/L (ref 0–100)
TCO2: 27 mmol/L (ref 0–100)
TCO2: 25 mmol/L (ref 0–100)
TCO2: 25 mmol/L (ref 0–100)
TCO2: 20 mmol/L (ref 0–100)
TCO2: 23 mmol/L (ref 0–100)

## 2015-11-27 LAB — CREATININE, SERUM
Creatinine, Ser: 0.97 mg/dL (ref 0.61–1.24)
GFR calc Af Amer: >60 mL/min (ref >60)
GFR calc non Af Amer: >60 mL/min (ref >60)

## 2015-11-27 LAB — GLUCOSE, CAPILLARY
GLUCOSE-CAPILLARY: 124 mg/dL — AB (ref 65–99)
GLUCOSE-CAPILLARY: 135 mg/dL — AB (ref 65–99)
GLUCOSE-CAPILLARY: 129 mg/dL — AB (ref 65–99)
Glucose-Capillary: 119 mg/dL — ABNORMAL HIGH (ref 65–99)
Glucose-Capillary: 133 mg/dL — ABNORMAL HIGH (ref 65–99)
Glucose-Capillary: 132 mg/dL — ABNORMAL HIGH (ref 65–99)

## 2015-11-27 LAB — PLATELET COUNT: Platelets: 152 10*3/uL (ref 150–400)

## 2015-11-27 LAB — HEMOGLOBIN AND HEMATOCRIT, BLOOD
HCT: 28.6 % — ABNORMAL LOW (ref 39.0–52.0)
Hemoglobin: 10.2 g/dL — ABNORMAL LOW (ref 13.0–17.0)

## 2015-11-27 LAB — HEPARIN LEVEL (UNFRACTIONATED): HEPARIN UNFRACTIONATED: 0.23 [IU]/mL — AB (ref 0.30–0.70)

## 2015-11-27 LAB — POCT I-STAT 4, (NA,K, GLUC, HGB,HCT)
Glucose, Bld: 124 mg/dL — ABNORMAL HIGH (ref 65–99)
HEMATOCRIT: 32 % — AB (ref 39.0–52.0)
HEMOGLOBIN: 10.9 g/dL — AB (ref 13.0–17.0)
Potassium: 3.8 mmol/L (ref 3.5–5.1)
SODIUM: 140 mmol/L (ref 135–145)

## 2015-11-27 LAB — APTT: aPTT: 34 seconds (ref 24–37)

## 2015-11-27 LAB — PROTIME-INR
INR: 1.57 — ABNORMAL HIGH (ref 0.00–1.49)
Prothrombin Time: 18.8 seconds — ABNORMAL HIGH (ref 11.6–15.2)

## 2015-11-27 LAB — MAGNESIUM: Magnesium: 2.6 mg/dL — ABNORMAL HIGH (ref 1.7–2.4)

## 2015-11-27 SURGERY — CORONARY ARTERY BYPASS GRAFTING (CABG)
Anesthesia: General | Site: Chest

## 2015-11-27 MED ORDER — SODIUM CHLORIDE 0.9 % IV SOLN
10.0000 g | INTRAVENOUS | Status: DC | PRN
  Administered 2015-11-27: 5 g/h via INTRAVENOUS
  Administered 2015-11-27: 13:00:00 via INTRAVENOUS

## 2015-11-27 MED ORDER — ROCURONIUM BROMIDE 50 MG/5ML IV SOLN
INTRAVENOUS | Status: AC
  Filled 2015-11-27: qty 1

## 2015-11-27 MED ORDER — TRAMADOL HCL 50 MG PO TABS
50.0000 mg | ORAL_TABLET | ORAL | Status: DC | PRN
  Administered 2015-11-27 – 2015-11-28 (×2): 100 mg via ORAL
  Filled 2015-11-27 (×2): qty 2

## 2015-11-27 MED ORDER — MIDAZOLAM HCL 5 MG/5ML IJ SOLN
INTRAMUSCULAR | Status: DC | PRN
  Administered 2015-11-27: 5 mg via INTRAVENOUS
  Administered 2015-11-27 (×2): 1 mg via INTRAVENOUS
  Administered 2015-11-27 (×2): 2 mg via INTRAVENOUS
  Administered 2015-11-27: 1 mg via INTRAVENOUS

## 2015-11-27 MED ORDER — LACTATED RINGERS IV SOLN
INTRAVENOUS | Status: DC
  Administered 2015-11-27: 14:00:00 via INTRAVENOUS

## 2015-11-27 MED ORDER — FENTANYL CITRATE (PF) 250 MCG/5ML IJ SOLN
INTRAMUSCULAR | Status: AC
  Filled 2015-11-27: qty 5

## 2015-11-27 MED ORDER — ARTIFICIAL TEARS OP OINT
TOPICAL_OINTMENT | OPHTHALMIC | Status: AC
  Filled 2015-11-27: qty 3.5

## 2015-11-27 MED ORDER — HEMOSTATIC AGENTS (NO CHARGE) OPTIME
TOPICAL | Status: DC | PRN
  Administered 2015-11-27: 1 via TOPICAL

## 2015-11-27 MED ORDER — ROCURONIUM BROMIDE 100 MG/10ML IV SOLN
INTRAVENOUS | Status: DC | PRN
  Administered 2015-11-27: 50 mg via INTRAVENOUS
  Administered 2015-11-27: 30 mg via INTRAVENOUS
  Administered 2015-11-27: 100 mg via INTRAVENOUS
  Administered 2015-11-27: 20 mg via INTRAVENOUS
  Administered 2015-11-27: 50 mg via INTRAVENOUS

## 2015-11-27 MED ORDER — POTASSIUM CHLORIDE 10 MEQ/50ML IV SOLN
10.0000 meq | INTRAVENOUS | Status: AC | PRN
  Administered 2015-11-27 (×3): 10 meq via INTRAVENOUS

## 2015-11-27 MED ORDER — BISACODYL 10 MG RE SUPP: 10.0000 mg | Freq: Every day | RECTAL | Status: DC

## 2015-11-27 MED ORDER — MIDAZOLAM HCL 2 MG/2ML IJ SOLN: 2.0000 mg | INTRAMUSCULAR | Status: DC | PRN

## 2015-11-27 MED ORDER — ACETAMINOPHEN 160 MG/5ML PO SOLN: 650.0000 mg | Freq: Once | ORAL | Status: AC

## 2015-11-27 MED ORDER — LACTATED RINGERS IV SOLN
INTRAVENOUS | Status: DC | PRN
  Administered 2015-11-27: 06:00:00 via INTRAVENOUS

## 2015-11-27 MED ORDER — MORPHINE SULFATE (PF) 2 MG/ML IV SOLN
1.0000 mg | INTRAVENOUS | Status: AC | PRN
  Administered 2015-11-27: 4 mg via INTRAVENOUS
  Filled 2015-11-27 (×2): qty 2

## 2015-11-27 MED ORDER — ARTIFICIAL TEARS OP OINT
TOPICAL_OINTMENT | OPHTHALMIC | Status: DC | PRN
  Administered 2015-11-27: 1 via OPHTHALMIC

## 2015-11-27 MED ORDER — NITROGLYCERIN IN D5W 200-5 MCG/ML-% IV SOLN: 0.0000 ug/min | INTRAVENOUS | Status: DC

## 2015-11-27 MED ORDER — PHENYLEPHRINE HCL 10 MG/ML IJ SOLN
10.0000 mg | INTRAVENOUS | Status: DC | PRN
  Administered 2015-11-27: 10 ug/min via INTRAVENOUS

## 2015-11-27 MED ORDER — PROTAMINE SULFATE 10 MG/ML IV SOLN
INTRAVENOUS | Status: DC | PRN
  Administered 2015-11-27: 290 mg via INTRAVENOUS

## 2015-11-27 MED ORDER — SODIUM CHLORIDE 0.9 % IV SOLN
INTRAVENOUS | Status: DC
  Administered 2015-11-27: 14:00:00 via INTRAVENOUS

## 2015-11-27 MED ORDER — VANCOMYCIN HCL IN DEXTROSE 1-5 GM/200ML-% IV SOLN
1000.0000 mg | Freq: Once | INTRAVENOUS | Status: AC
  Administered 2015-11-27: 1000 mg via INTRAVENOUS
  Filled 2015-11-27: qty 200

## 2015-11-27 MED ORDER — POTASSIUM CHLORIDE 10 MEQ/50ML IV SOLN
10.0000 meq | INTRAVENOUS | Status: AC
  Administered 2015-11-27 (×3): 10 meq via INTRAVENOUS

## 2015-11-27 MED ORDER — FENTANYL CITRATE (PF) 100 MCG/2ML IJ SOLN
INTRAMUSCULAR | Status: DC | PRN
  Administered 2015-11-27: 100 ug via INTRAVENOUS
  Administered 2015-11-27: 50 ug via INTRAVENOUS
  Administered 2015-11-27: 550 ug via INTRAVENOUS
  Administered 2015-11-27: 50 ug via INTRAVENOUS
  Administered 2015-11-27 (×2): 25 ug via INTRAVENOUS
  Administered 2015-11-27: 50 ug via INTRAVENOUS
  Administered 2015-11-27 (×2): 250 ug via INTRAVENOUS
  Administered 2015-11-27: 150 ug via INTRAVENOUS

## 2015-11-27 MED ORDER — METOPROLOL TARTRATE 5 MG/5ML IV SOLN: 2.5000 mg | INTRAVENOUS | Status: DC | PRN

## 2015-11-27 MED ORDER — ACETAMINOPHEN 500 MG PO TABS
1000.0000 mg | ORAL_TABLET | Freq: Four times a day (QID) | ORAL | Status: DC
  Administered 2015-11-27 – 2015-11-29 (×6): 1000 mg via ORAL
  Filled 2015-11-27 (×4): qty 2

## 2015-11-27 MED ORDER — SODIUM CHLORIDE 0.9% FLUSH
10.0000 mL | INTRAVENOUS | Status: DC | PRN
  Administered 2015-11-27: 30 mL
  Filled 2015-11-27: qty 40

## 2015-11-27 MED ORDER — DOCUSATE SODIUM 100 MG PO CAPS
200.0000 mg | ORAL_CAPSULE | Freq: Every day | ORAL | Status: DC
Start: 2015-11-28 — End: 2015-11-29
  Administered 2015-11-28: 200 mg via ORAL
  Filled 2015-11-27: qty 2

## 2015-11-27 MED ORDER — SODIUM CHLORIDE 0.9% FLUSH: 3.0000 mL | INTRAVENOUS | Status: DC | PRN

## 2015-11-27 MED ORDER — SODIUM CHLORIDE 0.9 % IJ SOLN
OROMUCOSAL | Status: DC | PRN
  Administered 2015-11-27: 09:00:00 via TOPICAL

## 2015-11-27 MED ORDER — FAMOTIDINE IN NACL 20-0.9 MG/50ML-% IV SOLN
20.0000 mg | Freq: Two times a day (BID) | INTRAVENOUS | Status: DC
  Administered 2015-11-27: 20 mg via INTRAVENOUS

## 2015-11-27 MED ORDER — ROCURONIUM BROMIDE 50 MG/5ML IV SOLN
INTRAVENOUS | Status: AC
  Filled 2015-11-27: qty 6

## 2015-11-27 MED ORDER — SODIUM CHLORIDE 0.9 % IV SOLN
INTRAVENOUS | Status: DC | PRN
  Administered 2015-11-27 (×2): via INTRAVENOUS

## 2015-11-27 MED ORDER — PROTAMINE SULFATE 10 MG/ML IV SOLN
INTRAVENOUS | Status: AC
  Filled 2015-11-27: qty 5

## 2015-11-27 MED ORDER — DEXTROSE 5 % IV SOLN
1.5000 g | INTRAVENOUS | Status: DC | PRN
  Administered 2015-11-27: 1.5 g via INTRAVENOUS
  Administered 2015-11-27: .75 g via INTRAVENOUS

## 2015-11-27 MED ORDER — PROTAMINE SULFATE 10 MG/ML IV SOLN
INTRAVENOUS | Status: AC
  Filled 2015-11-27: qty 25

## 2015-11-27 MED ORDER — SODIUM CHLORIDE 0.9% FLUSH
3.0000 mL | Freq: Two times a day (BID) | INTRAVENOUS | Status: DC
  Administered 2015-11-28: 3 mL via INTRAVENOUS

## 2015-11-27 MED ORDER — DEXMEDETOMIDINE HCL IN NACL 400 MCG/100ML IV SOLN
INTRAVENOUS | Status: DC | PRN
  Administered 2015-11-27: .3 ug/kg/h via INTRAVENOUS
  Administered 2015-11-27: 13:00:00 via INTRAVENOUS

## 2015-11-27 MED ORDER — DEXTROSE 5 % IV SOLN
0.0000 ug/min | INTRAVENOUS | Status: DC
  Filled 2015-11-27: qty 2

## 2015-11-27 MED ORDER — HEPARIN SODIUM (PORCINE) 1000 UNIT/ML IJ SOLN
INTRAMUSCULAR | Status: DC | PRN
  Administered 2015-11-27: 29000 [IU] via INTRAVENOUS

## 2015-11-27 MED ORDER — SODIUM CHLORIDE 0.9 % IV SOLN
INTRAVENOUS | Status: DC
  Administered 2015-11-27: 21:00:00 via INTRAVENOUS
  Filled 2015-11-27 (×2): qty 2.5

## 2015-11-27 MED ORDER — SODIUM CHLORIDE 0.9 % IV SOLN
250.0000 [IU] | INTRAVENOUS | Status: DC | PRN
  Administered 2015-11-27: 1 [IU]/h via INTRAVENOUS

## 2015-11-27 MED ORDER — MAGNESIUM SULFATE 4 GM/100ML IV SOLN
4.0000 g | Freq: Once | INTRAVENOUS | Status: AC
  Administered 2015-11-27: 4 g via INTRAVENOUS
  Filled 2015-11-27: qty 100

## 2015-11-27 MED ORDER — DEXMEDETOMIDINE HCL IN NACL 200 MCG/50ML IV SOLN
0.0000 ug/kg/h | INTRAVENOUS | Status: DC
  Filled 2015-11-27: qty 50

## 2015-11-27 MED ORDER — ALBUMIN HUMAN 5 % IV SOLN
250.0000 mL | INTRAVENOUS | Status: AC | PRN
  Administered 2015-11-27: 250 mL via INTRAVENOUS

## 2015-11-27 MED ORDER — SODIUM CHLORIDE 0.9 % IV SOLN: 250.0000 mL | INTRAVENOUS | Status: DC

## 2015-11-27 MED ORDER — VANCOMYCIN HCL 1000 MG IV SOLR
1000.0000 mg | INTRAVENOUS | Status: DC | PRN
  Administered 2015-11-27: 1000 mg via INTRAVENOUS

## 2015-11-27 MED ORDER — ONDANSETRON HCL 4 MG/2ML IJ SOLN
4.0000 mg | Freq: Four times a day (QID) | INTRAMUSCULAR | Status: DC | PRN
  Administered 2015-11-29: 4 mg via INTRAVENOUS
  Filled 2015-11-27: qty 2

## 2015-11-27 MED ORDER — CETYLPYRIDINIUM CHLORIDE 0.05 % MT LIQD
7.0000 mL | Freq: Two times a day (BID) | OROMUCOSAL | Status: DC
  Administered 2015-11-27: 7 mL via OROMUCOSAL

## 2015-11-27 MED ORDER — KETOROLAC TROMETHAMINE 15 MG/ML IJ SOLN
15.0000 mg | Freq: Three times a day (TID) | INTRAMUSCULAR | Status: DC
Start: 2015-11-27 — End: 2015-11-29
  Administered 2015-11-27 – 2015-11-29 (×5): 15 mg via INTRAVENOUS
  Filled 2015-11-27 (×3): qty 1

## 2015-11-27 MED ORDER — DEXTROSE 5 % IV SOLN
1.5000 g | Freq: Two times a day (BID) | INTRAVENOUS | Status: DC
  Administered 2015-11-28 (×3): 1.5 g via INTRAVENOUS
  Filled 2015-11-27 (×4): qty 1.5

## 2015-11-27 MED ORDER — ASPIRIN EC 325 MG PO TBEC
325.0000 mg | DELAYED_RELEASE_TABLET | Freq: Every day | ORAL | Status: DC
  Administered 2015-11-28: 325 mg via ORAL
  Filled 2015-11-27: qty 1

## 2015-11-27 MED ORDER — ACETAMINOPHEN 160 MG/5ML PO SOLN: 1000.0000 mg | Freq: Four times a day (QID) | ORAL | Status: DC

## 2015-11-27 MED ORDER — SODIUM CHLORIDE 0.45 % IV SOLN
INTRAVENOUS | Status: DC | PRN
  Administered 2015-11-27: 14:00:00 via INTRAVENOUS

## 2015-11-27 MED ORDER — FENTANYL CITRATE (PF) 250 MCG/5ML IJ SOLN
INTRAMUSCULAR | Status: AC
  Filled 2015-11-27: qty 25

## 2015-11-27 MED ORDER — PHENYLEPHRINE 40 MCG/ML (10ML) SYRINGE FOR IV PUSH (FOR BLOOD PRESSURE SUPPORT)
PREFILLED_SYRINGE | INTRAVENOUS | Status: AC
  Filled 2015-11-27: qty 10

## 2015-11-27 MED ORDER — SODIUM CHLORIDE 0.9% FLUSH
10.0000 mL | Freq: Two times a day (BID) | INTRAVENOUS | Status: DC
  Administered 2015-11-27 – 2015-11-28 (×2): 10 mL

## 2015-11-27 MED ORDER — METOPROLOL TARTRATE 25 MG/10 ML ORAL SUSPENSION: 12.5000 mg | Freq: Two times a day (BID) | ORAL | Status: DC

## 2015-11-27 MED ORDER — NITROGLYCERIN IN D5W 200-5 MCG/ML-% IV SOLN
INTRAVENOUS | Status: DC | PRN
  Administered 2015-11-27: 15 ug/min via INTRAVENOUS

## 2015-11-27 MED ORDER — LACTATED RINGERS IV SOLN: 500.0000 mL | Freq: Once | INTRAVENOUS | Status: DC | PRN

## 2015-11-27 MED ORDER — MORPHINE SULFATE (PF) 2 MG/ML IV SOLN
2.0000 mg | INTRAVENOUS | Status: DC | PRN
  Administered 2015-11-27 (×2): 4 mg via INTRAVENOUS
  Filled 2015-11-27: qty 2

## 2015-11-27 MED ORDER — INSULIN REGULAR BOLUS VIA INFUSION
0.0000 [IU] | Freq: Three times a day (TID) | INTRAVENOUS | Status: DC
  Filled 2015-11-27: qty 10

## 2015-11-27 MED ORDER — KETOROLAC TROMETHAMINE 15 MG/ML IJ SOLN
INTRAMUSCULAR | Status: AC
  Filled 2015-11-27: qty 1

## 2015-11-27 MED ORDER — ACETAMINOPHEN 650 MG RE SUPP
650.0000 mg | Freq: Once | RECTAL | Status: AC
  Administered 2015-11-27: 650 mg via RECTAL
  Filled 2015-11-27: qty 1

## 2015-11-27 MED ORDER — 0.9 % SODIUM CHLORIDE (POUR BTL) OPTIME
TOPICAL | Status: DC | PRN
  Administered 2015-11-27: 6000 mL

## 2015-11-27 MED ORDER — LACTATED RINGERS IV SOLN
INTRAVENOUS | Status: DC | PRN
  Administered 2015-11-27 (×2): via INTRAVENOUS

## 2015-11-27 MED ORDER — DOPAMINE-DEXTROSE 3.2-5 MG/ML-% IV SOLN: 0.0000 ug/kg/min | INTRAVENOUS | Status: DC

## 2015-11-27 MED ORDER — PHENYLEPHRINE HCL 10 MG/ML IJ SOLN
INTRAMUSCULAR | Status: DC | PRN
Start: 2015-11-27 — End: 2015-11-27
  Administered 2015-11-27: 40 ug via INTRAVENOUS
  Administered 2015-11-27: 20 ug via INTRAVENOUS
  Administered 2015-11-27 (×5): 40 ug via INTRAVENOUS

## 2015-11-27 MED ORDER — LIDOCAINE 2% (20 MG/ML) 5 ML SYRINGE
INTRAMUSCULAR | Status: AC
  Filled 2015-11-27: qty 5

## 2015-11-27 MED ORDER — DOPAMINE-DEXTROSE 3.2-5 MG/ML-% IV SOLN
INTRAVENOUS | Status: DC | PRN
  Administered 2015-11-27: 3 ug/kg/min via INTRAVENOUS

## 2015-11-27 MED ORDER — BISACODYL 5 MG PO TBEC
10.0000 mg | DELAYED_RELEASE_TABLET | Freq: Every day | ORAL | Status: DC
Start: 2015-11-28 — End: 2015-11-29
  Administered 2015-11-28: 10 mg via ORAL
  Filled 2015-11-27: qty 2

## 2015-11-27 MED ORDER — PROPOFOL 10 MG/ML IV BOLUS
INTRAVENOUS | Status: DC | PRN
  Administered 2015-11-27: 50 mg via INTRAVENOUS

## 2015-11-27 MED ORDER — EPHEDRINE SULFATE 50 MG/ML IJ SOLN
INTRAMUSCULAR | Status: DC | PRN
  Administered 2015-11-27: 5 mg via INTRAVENOUS

## 2015-11-27 MED ORDER — PROPOFOL 10 MG/ML IV BOLUS
INTRAVENOUS | Status: AC
  Filled 2015-11-27: qty 40

## 2015-11-27 MED ORDER — LACTATED RINGERS IV SOLN
INTRAVENOUS | Status: DC | PRN
  Administered 2015-11-27: 07:00:00 via INTRAVENOUS

## 2015-11-27 MED ORDER — ASPIRIN 81 MG PO CHEW
324.0000 mg | CHEWABLE_TABLET | Freq: Every day | ORAL | Status: DC
Start: 2015-11-28 — End: 2015-11-29

## 2015-11-27 MED ORDER — MIDAZOLAM HCL 2 MG/2ML IJ SOLN
INTRAMUSCULAR | Status: AC
  Filled 2015-11-27: qty 2

## 2015-11-27 MED ORDER — MIDAZOLAM HCL 10 MG/2ML IJ SOLN
INTRAMUSCULAR | Status: AC
  Filled 2015-11-27: qty 2

## 2015-11-27 MED ORDER — CHLORHEXIDINE GLUCONATE 0.12 % MT SOLN
15.0000 mL | OROMUCOSAL | Status: AC
  Administered 2015-11-27: 15 mL via OROMUCOSAL

## 2015-11-27 MED ORDER — METOPROLOL TARTRATE 12.5 MG HALF TABLET
12.5000 mg | ORAL_TABLET | Freq: Two times a day (BID) | ORAL | Status: DC
  Administered 2015-11-28 (×2): 12.5 mg via ORAL
  Filled 2015-11-27 (×2): qty 1

## 2015-11-27 MED ORDER — PANTOPRAZOLE SODIUM 40 MG PO TBEC: 40.0000 mg | DELAYED_RELEASE_TABLET | Freq: Every day | ORAL | Status: DC

## 2015-11-27 MED ORDER — CHLORHEXIDINE GLUCONATE 0.12 % MT SOLN: 15.0000 mL | Freq: Two times a day (BID) | OROMUCOSAL | Status: DC

## 2015-11-27 MED ORDER — OXYCODONE HCL 5 MG PO TABS
5.0000 mg | ORAL_TABLET | ORAL | Status: DC | PRN
  Administered 2015-11-27 – 2015-11-28 (×3): 10 mg via ORAL
  Administered 2015-11-28: 5 mg via ORAL
  Filled 2015-11-27 (×4): qty 2

## 2015-11-27 MED ORDER — ALBUMIN HUMAN 5 % IV SOLN
INTRAVENOUS | Status: DC | PRN
  Administered 2015-11-27 (×2): via INTRAVENOUS

## 2015-11-27 MED FILL — Sodium Bicarbonate IV Soln 8.4%: INTRAVENOUS | Qty: 50 | Status: AC

## 2015-11-27 MED FILL — Heparin Sodium (Porcine) Inj 1000 Unit/ML: INTRAMUSCULAR | Qty: 30 | Status: AC

## 2015-11-27 MED FILL — Electrolyte-R (PH 7.4) Solution: INTRAVENOUS | Qty: 4000 | Status: AC

## 2015-11-27 MED FILL — Heparin Sodium (Porcine) Inj 1000 Unit/ML: INTRAMUSCULAR | Qty: 20 | Status: AC

## 2015-11-27 MED FILL — Mannitol IV Soln 20%: INTRAVENOUS | Qty: 500 | Status: AC

## 2015-11-27 MED FILL — Magnesium Sulfate Inj 50%: INTRAMUSCULAR | Qty: 10 | Status: AC

## 2015-11-27 MED FILL — Potassium Chloride Inj 2 mEq/ML: INTRAVENOUS | Qty: 40 | Status: AC

## 2015-11-27 MED FILL — Lidocaine HCl IV Inj 20 MG/ML: INTRAVENOUS | Qty: 5 | Status: AC

## 2015-11-27 MED FILL — Sodium Chloride IV Soln 0.9%: INTRAVENOUS | Qty: 2000 | Status: AC

## 2015-11-27 SURGICAL SUPPLY — 74 items
BAG DECANTER FOR FLEXI CONT (MISCELLANEOUS) ×2 IMPLANT
BANDAGE ACE 4X5 VEL STRL LF (GAUZE/BANDAGES/DRESSINGS) ×2 IMPLANT
BANDAGE ACE 6X5 VEL STRL LF (GAUZE/BANDAGES/DRESSINGS) ×2 IMPLANT
BANDAGE ELASTIC 4 VELCRO ST LF (GAUZE/BANDAGES/DRESSINGS) ×2 IMPLANT
BANDAGE ELASTIC 6 VELCRO ST LF (GAUZE/BANDAGES/DRESSINGS) ×2 IMPLANT
BLADE STERNUM SYSTEM 6 (BLADE) ×2 IMPLANT
BLADE SURG 11 STRL SS (BLADE) ×2 IMPLANT
BNDG GAUZE ELAST 4 BULKY (GAUZE/BANDAGES/DRESSINGS) ×2 IMPLANT
CANISTER SUCTION 2500CC (MISCELLANEOUS) ×2 IMPLANT
CANNULA ARTERIAL NVNT 3/8 22FR (MISCELLANEOUS) ×2 IMPLANT
CATH CPB KIT GERHARDT (MISCELLANEOUS) ×2 IMPLANT
CATH THORACIC 28FR (CATHETERS) ×2 IMPLANT
CLIP FOGARTY SPRING 6M (CLIP) ×2 IMPLANT
CRADLE DONUT ADULT HEAD (MISCELLANEOUS) ×2 IMPLANT
DERMABOND ADVANCED (GAUZE/BANDAGES/DRESSINGS) ×1
DERMABOND ADVANCED .7 DNX12 (GAUZE/BANDAGES/DRESSINGS) ×1 IMPLANT
DRAIN CHANNEL 28F RND 3/8 FF (WOUND CARE) ×2 IMPLANT
DRAPE CARDIOVASCULAR INCISE (DRAPES) ×1
DRAPE SLUSH/WARMER DISC (DRAPES) ×2 IMPLANT
DRAPE SRG 135X102X78XABS (DRAPES) ×1 IMPLANT
DRSG AQUACEL AG ADV 3.5X14 (GAUZE/BANDAGES/DRESSINGS) ×2 IMPLANT
ELECT BLADE 4.0 EZ CLEAN MEGAD (MISCELLANEOUS) ×2
ELECT REM PT RETURN 9FT ADLT (ELECTROSURGICAL) ×4
ELECTRODE BLDE 4.0 EZ CLN MEGD (MISCELLANEOUS) ×1 IMPLANT
ELECTRODE REM PT RTRN 9FT ADLT (ELECTROSURGICAL) ×2 IMPLANT
FELT TEFLON 1X6 (MISCELLANEOUS) ×2 IMPLANT
GAUZE SPONGE 4X4 12PLY STRL (GAUZE/BANDAGES/DRESSINGS) ×4 IMPLANT
GLOVE BIO SURGEON STRL SZ 6.5 (GLOVE) ×8 IMPLANT
GLOVE BIOGEL M 6.5 STRL (GLOVE) ×12 IMPLANT
GLOVE BIOGEL PI IND STRL 7.0 (GLOVE) ×3 IMPLANT
GLOVE BIOGEL PI INDICATOR 7.0 (GLOVE) ×3
GOWN STRL REUS W/ TWL LRG LVL3 (GOWN DISPOSABLE) ×7 IMPLANT
GOWN STRL REUS W/TWL LRG LVL3 (GOWN DISPOSABLE) ×7
HEMOSTAT POWDER SURGIFOAM 1G (HEMOSTASIS) ×6 IMPLANT
HEMOSTAT SURGICEL 2X14 (HEMOSTASIS) ×2 IMPLANT
KIT BASIN OR (CUSTOM PROCEDURE TRAY) ×2 IMPLANT
KIT CATH SUCT 8FR (CATHETERS) ×2 IMPLANT
KIT ROOM TURNOVER OR (KITS) ×2 IMPLANT
KIT SUCTION CATH 14FR (SUCTIONS) ×6 IMPLANT
KIT VASOVIEW 6 PRO VH 2400 (KITS) ×2 IMPLANT
LEAD PACING MYOCARDI (MISCELLANEOUS) ×2 IMPLANT
MARKER GRAFT CORONARY BYPASS (MISCELLANEOUS) ×6 IMPLANT
NS IRRIG 1000ML POUR BTL (IV SOLUTION) ×12 IMPLANT
PACK OPEN HEART (CUSTOM PROCEDURE TRAY) ×2 IMPLANT
PAD ARMBOARD 7.5X6 YLW CONV (MISCELLANEOUS) ×4 IMPLANT
PAD ELECT DEFIB RADIOL ZOLL (MISCELLANEOUS) ×2 IMPLANT
PENCIL BUTTON HOLSTER BLD 10FT (ELECTRODE) ×2 IMPLANT
PUNCH AORTIC ROTATE  4.5MM 8IN (MISCELLANEOUS) ×2 IMPLANT
SET CARDIOPLEGIA MPS 5001102 (MISCELLANEOUS) ×2 IMPLANT
SOLUTION ANTI FOG 6CC (MISCELLANEOUS) ×2 IMPLANT
SPONGE LAP 18X18 X RAY DECT (DISPOSABLE) ×4 IMPLANT
SURGIFLO W/THROMBIN 8M KIT (HEMOSTASIS) ×2 IMPLANT
SUT BONE WAX W31G (SUTURE) ×2 IMPLANT
SUT MNCRL AB 4-0 PS2 18 (SUTURE) ×2 IMPLANT
SUT PROLENE 3 0 SH1 36 (SUTURE) ×4 IMPLANT
SUT PROLENE 4 0 TF (SUTURE) ×4 IMPLANT
SUT PROLENE 6 0 CC (SUTURE) ×14 IMPLANT
SUT PROLENE 7 0 BV1 MDA (SUTURE) ×6 IMPLANT
SUT PROLENE 8 0 BV175 6 (SUTURE) ×12 IMPLANT
SUT STEEL 6MS V (SUTURE) ×2 IMPLANT
SUT STEEL SZ 6 DBL 3X14 BALL (SUTURE) ×2 IMPLANT
SUT VIC AB 1 CTX 18 (SUTURE) ×4 IMPLANT
SUT VIC AB 2-0 CT1 27 (SUTURE) ×1
SUT VIC AB 2-0 CT1 TAPERPNT 27 (SUTURE) ×1 IMPLANT
SUTURE E-PAK OPEN HEART (SUTURE) ×2 IMPLANT
SYSTEM SAHARA CHEST DRAIN ATS (WOUND CARE) ×2 IMPLANT
TAPE CLOTH SURG 4X10 WHT LF (GAUZE/BANDAGES/DRESSINGS) ×2 IMPLANT
TAPE PAPER 2X10 WHT MICROPORE (GAUZE/BANDAGES/DRESSINGS) ×2 IMPLANT
TOWEL OR 17X24 6PK STRL BLUE (TOWEL DISPOSABLE) ×4 IMPLANT
TOWEL OR 17X26 10 PK STRL BLUE (TOWEL DISPOSABLE) ×4 IMPLANT
TRAY FOLEY IC TEMP SENS 16FR (CATHETERS) ×2 IMPLANT
TUBING INSUFFLATION (TUBING) ×2 IMPLANT
UNDERPAD 30X30 INCONTINENT (UNDERPADS AND DIAPERS) ×2 IMPLANT
WATER STERILE IRR 1000ML POUR (IV SOLUTION) ×4 IMPLANT

## 2015-11-27 NOTE — Progress Notes (Signed)
While patient has been sleeping patient heart rate has been high 40's to low 50's.  Will continue to monitor.

## 2015-11-27 NOTE — Transfer of Care (Addendum)
Immediate Anesthesia Transfer of Care Note  Patient: KONG PACKETT  Procedure(s) Performed: Procedure(s): CORONARY ARTERY BYPASS GRAFTING (CABG) x 5 (LIMA to LAD, SVG to DIAGONAL, SVG SEQUENTIALLY to OM1 and OM2, SVG to PDA) with EVH from right leg using greater saphenous vein and mammary. (N/A) INTRAOPERATIVE TRANSESOPHAGEAL ECHOCARDIOGRAM (N/A)  Patient Location: SICU  Anesthesia Type:General  Level of Consciousness: sedated and Patient remains intubated per anesthesia plan  Airway & Oxygen Therapy: Patient remains intubated per anesthesia plan and Patient placed on Ventilator (see vital sign flow sheet for setting)  Post-op Assessment: Report given to RN and Post -op Vital signs reviewed and stable  Post vital signs: Reviewed and stable  Last Vitals:  Filed Vitals:   11/27/15 0510 11/27/15 0612  BP:  100/79  Pulse: 48 50  Temp:  36.9 C  Resp:  17    Last Pain:  Filed Vitals:   11/27/15 0615  PainSc: 0-No pain      Patients Stated Pain Goal: 0 (75/19/82 4299)  Complications: No apparent anesthesia complications   Pt VSS during transport to ICU. Pt met my ICU RN and RT. Report given to RN/RT and all questions answered. Pt connected to ICU monitors and continued stability confirmed.

## 2015-11-27 NOTE — Anesthesia Procedure Notes (Addendum)
Central Venous Catheter Insertion Performed by: anesthesiologist Patient location: Pre-op. Preanesthetic checklist: patient identified, IV checked, site marked, risks and benefits discussed, surgical consent, monitors and equipment checked, pre-op evaluation, timeout performed and anesthesia consent Position: Trendelenburg Lidocaine 1% used for infiltration Landmarks identified and Seldinger technique used Catheter size: 8.5 Fr Central line and PA cath was placed.Sheath introducer Swan type and PA catheter depth:thermodilation and 41PA Cath depth:41 Procedure performed using ultrasound guided technique. Attempts: 1 Following insertion, line sutured and dressing applied. Post procedure assessment: blood return through all ports, free fluid flow and no air. Patient tolerated the procedure well with no immediate complications.   Procedure Name: Intubation Date/Time: 11/27/2015 8:01 AM Performed by: Daiva EvesAVENEL, Willis Holquin W Pre-anesthesia Checklist: Patient identified, Timeout performed, Emergency Drugs available, Suction available and Patient being monitored Patient Re-evaluated:Patient Re-evaluated prior to inductionOxygen Delivery Method: Circle system utilized Preoxygenation: Pre-oxygenation with 100% oxygen Intubation Type: IV induction Ventilation: Oral airway inserted - appropriate to patient size and Mask ventilation without difficulty Laryngoscope Size: Mac and 4 Grade View: Grade II Tube type: Oral Tube size: 8.0 mm Number of attempts: 1 Airway Equipment and Method: Stylet Placement Confirmation: ETT inserted through vocal cords under direct vision,  breath sounds checked- equal and bilateral,  positive ETCO2 and CO2 detector Secured at: 22 cm Tube secured with: Tape Dental Injury: Teeth and Oropharynx as per pre-operative assessment  Comments: Grade 2 with [place] lifting head (pt has large head)

## 2015-11-27 NOTE — Progress Notes (Signed)
  Echocardiogram Echocardiogram Transesophageal has been performed.  Royalti Schauf M 11/27/2015, 8:47 AM 

## 2015-11-27 NOTE — Progress Notes (Signed)
RN offered to set-up pre-op educational video for patient.  Patient stated he did not want to watch the video.

## 2015-11-27 NOTE — Anesthesia Postprocedure Evaluation (Signed)
Anesthesia Post Note  Patient: Justin Villa  Procedure(s) Performed: Procedure(s) (LRB): CORONARY ARTERY BYPASS GRAFTING (CABG) x 5 (LIMA to LAD, SVG to DIAGONAL, SVG SEQUENTIALLY to OM1 and OM2, SVG to PDA) with EVH from right leg using greater saphenous vein and mammary. (N/A) INTRAOPERATIVE TRANSESOPHAGEAL ECHOCARDIOGRAM (N/A)  Patient location during evaluation: SICU Anesthesia Type: General Level of consciousness: sedated and patient remains intubated per anesthesia plan Pain management: pain level controlled Vital Signs Assessment: post-procedure vital signs reviewed and stable Respiratory status: patient remains intubated per anesthesia plan and patient on ventilator - see flowsheet for VS Cardiovascular status: stable Anesthetic complications: no    Last Vitals:  Filed Vitals:   11/27/15 1400 11/27/15 1404  BP: 95/70 95/70  Pulse: 89 90  Temp: 36.9 C   Resp: 16 15    Last Pain:  Filed Vitals:   11/27/15 1407  PainSc: 0-No pain                 Justin Villa

## 2015-11-27 NOTE — Procedures (Signed)
Extubation Procedure Note  Patient Details:   Name: Justin Villa DOB: 09/13/1965 MRN: [id number]   Airway Documentation:     Evaluation  O2 sats: stable throughout Complications: No apparent complications Patient did tolerate procedure well. Bilateral Breath Sounds: Clear   Yes   Pt extubated to 4L N/C, no stridor noted, RN at bedside.  NIF greater than -20, V/C ~ 1000 mL.   Justin Villa 11/27/2015, 5:04 PM

## 2015-11-27 NOTE — Progress Notes (Signed)
Patient ID: Justin Villa, male   DOB: 05/16/1966, 50 y.o.   MRN: [id number] EVENING ROUNDS NOTE :     [address]       [place] [id number]             [phone]                 Day of Surgery Procedure(s) (LRB): CORONARY ARTERY BYPASS GRAFTING (CABG) x 5 (LIMA to LAD, SVG to DIAGONAL, SVG SEQUENTIALLY to OM1 and OM2, SVG to PDA) with EVH from right leg using greater saphenous vein and mammary. (N/A) INTRAOPERATIVE TRANSESOPHAGEAL ECHOCARDIOGRAM (N/A)  Total Length of Stay:  LOS: 1 day  BP 110/78 mmHg  Pulse 79  Temp(Src) 98.8 F (37.1 C) (Oral)  Resp 17  Ht 6' (1.829 m)  Wt 231 lb 3.2 oz (104.872 kg)  BMI 31.35 kg/m2  SpO2 96%  .Intake/Output      06/23 0701 - 06/24 0700   P.O. 150   I.V. (mL/kg) 4266 (40.7)   Blood 1030   NG/GT 30   IV Piggyback 1030   Total Intake(mL/kg) 6506 (62)   Urine (mL/kg/hr) 2655 (1.8)   Emesis/NG output 100 (0.1)   Blood 1635 (1.1)   Chest Tube 150 (0.1)   Total Output 4540   Net +1966         . sodium chloride 20 mL/hr at 11/27/15 1400  . [START ON 11/28/2015] sodium chloride    . sodium chloride 1 mL/hr at 11/27/15 1400  . dexmedetomidine 0.1 mcg/kg/hr (11/27/15 1800)  . DOPamine Stopped (11/27/15 1400)  . insulin (NOVOLIN-R) infusion 1.5 Units/hr (11/27/15 1800)  . lactated ringers Stopped (11/27/15 1700)  . lactated ringers 20 mL/hr at 11/27/15 1400  . nitroGLYCERIN Stopped (11/27/15 1400)  . phenylephrine (NEO-SYNEPHRINE) Adult infusion Stopped (11/27/15 1400)     Lab Results  Component Value Date   WBC 9.6 11/27/2015   HGB 11.9* 11/27/2015   HCT 34.1* 11/27/2015   PLT 121* 11/27/2015   GLUCOSE 124* 11/27/2015   CHOL 158 11/26/2015   TRIG 135 11/26/2015   HDL 35* 11/26/2015   LDLCALC 96 11/26/2015   ALT 46 11/26/2015   AST 28 11/26/2015   NA 140 11/27/2015   K 3.8 11/27/2015   CL 103 11/27/2015   CREATININE 0.80 11/27/2015   BUN 11 11/27/2015   CO2 24 11/27/2015   INR 1.57* 11/27/2015   HGBA1C 5.6 11/26/2015   Stable postop Not bleeding   Delight OvensEdward B Ceilidh Torregrossa MD  Beeper [phone] Office [phone] 11/27/2015 8:51 PM

## 2015-11-27 NOTE — Plan of Care (Signed)
Problem: Respiratory: Goal: Ability to tolerate decreased levels of ventilator support will improve Outcome: Progressing Pt beginning rapid wean process

## 2015-11-27 NOTE — Brief Op Note (Addendum)
      [address]       [place] [id number]             [phone]       11/27/2015  11:44 AM  PATIENT:  Justin Villa  50 y.o. male  PRE-OPERATIVE DIAGNOSIS:  1. S/P NSTEMI 2. CAD  POST-OPERATIVE DIAGNOSIS:  1. S/P NSTEMI 2. CAD  PROCEDURE:  INTRAOPERATIVE TRANSESOPHAGEAL ECHOCARDIOGRAM, MEDIAN STERNOTOMY for CORONARY ARTERY BYPASS GRAFTING (CABG) x 5 (LIMA to LAD, SVG to DIAGONAL, SVG SEQUENTIALLY to OM1 and OM2, SVG to PDA) with EVH from right leg  SURGEON:  Surgeon(s) and Role:    * Delight OvensEdward B Jiali Linney, MD - Primary  PHYSICIAN ASSISTANT: Doree Fudgeonielle Zimmerman PA-C  ASSISTANTS: Benay SpiceMarie Irwin RNFA  ANESTHESIA:   general  EBL:  Total I/O In: -  Out: 200 [Urine:200]   DRAINS: Chest tubes placed in the mediastinal and pleural spaces   COUNTS CORRECT:  YES  DICTATION: .Dragon Dictation  PLAN OF CARE: Admit to inpatient   PATIENT DISPOSITION:  ICU - intubated and hemodynamically stable.   Delay start of Pharmacological VTE agent (>24hrs) due to surgical blood loss or risk of bleeding: yes  BASELINE WEIGHT: 105 kg

## 2015-11-27 NOTE — Progress Notes (Signed)
ANTICOAGULATION CONSULT NOTE - Follow Up Consult  Pharmacy Consult for Heparin  Indication: CABG 6/23  No Known Allergies  Patient Measurements: Height: 6' (182.9 cm) Weight: 233 lb 11.2 oz (106.006 kg) IBW/kg (Calculated) : 77.6  Vital Signs: Temp: 98 F (36.7 C) (06/23 0029) Temp Source: Oral (06/23 0029) BP: 119/81 mmHg (06/23 0029) Pulse Rate: 61 (06/23 0029)  Labs:  Recent Labs  11/26/15 0010 11/26/15 0037 11/26/15 0430 11/26/15 0436 11/26/15 1138 11/26/15 1640 11/27/15 0107  HGB 15.0  --  13.1  --   --   --   --   HCT 41.3  --  38.2*  --   --   --   --   PLT 244  --  221  --   --   --   --   APTT  --  33  --   --   --   --   --   LABPROT  --  16.0*  --   --   --   --   --   INR  --  1.26  --   --   --   --   --   HEPARINUNFRC  --   --   --   --   --   --  0.23*  CREATININE 0.98  --  1.01  --   --   --   --   TROPONINI <0.03  --   --  1.71* 3.16* 2.66*  --     Estimated Creatinine Clearance: 107.7 mL/min (by C-G formula based on Cr of 1.01).  Assessment: Heparin while awaiting CABG this AM, HL is low  Goal of Therapy:  Heparin level 0.3-0.7 units/ml Monitor platelets by anticoagulation protocol: Yes   Plan:  -Increase heparin to 1600 units/hr -Heparin to be turned off on call to OR  Justin Villa, Justin Villa 11/27/2015,2:17 AM

## 2015-11-28 ENCOUNTER — Inpatient Hospital Stay (HOSPITAL_COMMUNITY): Payer: PRIVATE HEALTH INSURANCE

## 2015-11-28 DIAGNOSIS — I213 ST elevation (STEMI) myocardial infarction of unspecified site: Secondary | ICD-10-CM

## 2015-11-28 LAB — VAS US DOPPLER PRE CABG
LEFT ECA DIAS: -12 cm/s
LEFT VERTEBRAL DIAS: -23 cm/s
Left CCA dist dias: -18 cm/s
Left CCA dist sys: -87 cm/s
Left CCA prox dias: -21 cm/s
Left CCA prox sys: -100 cm/s
Left ICA dist dias: -22 cm/s
Left ICA dist sys: -63 cm/s
Left ICA prox dias: 20 cm/s
Left ICA prox sys: 71 cm/s
RIGHT ECA DIAS: -14 cm/s
Right CCA prox dias: 17 cm/s
Right CCA prox sys: 123 cm/s
Right cca dist sys: -91 cm/s

## 2015-11-28 LAB — BASIC METABOLIC PANEL
Anion gap: 5 (ref 5–15)
BUN: 13 mg/dL (ref 6–20)
CHLORIDE: 109 mmol/L (ref 101–111)
CO2: 22 mmol/L (ref 22–32)
CREATININE: 1.05 mg/dL (ref 0.61–1.24)
Calcium: 7.6 mg/dL — ABNORMAL LOW (ref 8.9–10.3)
GFR calc Af Amer: >60 mL/min (ref >60)
GFR calc non Af Amer: >60 mL/min (ref >60)
Glucose, Bld: 96 mg/dL (ref 65–99)
POTASSIUM: 4.1 mmol/L (ref 3.5–5.1)
Sodium: 136 mmol/L (ref 135–145)

## 2015-11-28 LAB — CBC
HEMATOCRIT: 31.9 % — AB (ref 39.0–52.0)
HEMATOCRIT: 33.2 % — AB (ref 39.0–52.0)
HEMOGLOBIN: 10.7 g/dL — AB (ref 13.0–17.0)
HEMOGLOBIN: 11.2 g/dL — AB (ref 13.0–17.0)
MCH: 27.9 pg (ref 26.0–34.0)
MCH: 28.2 pg (ref 26.0–34.0)
MCHC: 33.5 g/dL (ref 30.0–36.0)
MCHC: 33.7 g/dL (ref 30.0–36.0)
MCV: 83.3 fL (ref 78.0–100.0)
MCV: 83.6 fL (ref 78.0–100.0)
Platelets: 113 10*3/uL — ABNORMAL LOW (ref 150–400)
Platelets: 135 10*3/uL — ABNORMAL LOW (ref 150–400)
RBC: 3.83 MIL/uL — AB (ref 4.22–5.81)
RBC: 3.97 MIL/uL — AB (ref 4.22–5.81)
RDW: 13.8 % (ref 11.5–15.5)
RDW: 14 % (ref 11.5–15.5)
WBC: 8.1 10*3/uL (ref 4.0–10.5)
WBC: 10.2 10*3/uL (ref 4.0–10.5)

## 2015-11-28 LAB — GLUCOSE, CAPILLARY
GLUCOSE-CAPILLARY: 131 mg/dL — AB (ref 65–99)
GLUCOSE-CAPILLARY: 146 mg/dL — AB (ref 65–99)
GLUCOSE-CAPILLARY: 128 mg/dL — AB (ref 65–99)
GLUCOSE-CAPILLARY: 109 mg/dL — AB (ref 65–99)
GLUCOSE-CAPILLARY: 90 mg/dL (ref 65–99)
GLUCOSE-CAPILLARY: 87 mg/dL (ref 65–99)
GLUCOSE-CAPILLARY: 130 mg/dL — AB (ref 65–99)
GLUCOSE-CAPILLARY: 118 mg/dL — AB (ref 65–99)
GLUCOSE-CAPILLARY: 163 mg/dL — AB (ref 65–99)
Glucose-Capillary: 117 mg/dL — ABNORMAL HIGH (ref 65–99)
Glucose-Capillary: 118 mg/dL — ABNORMAL HIGH (ref 65–99)

## 2015-11-28 LAB — ECHOCARDIOGRAM COMPLETE
E/e' ratio: 10.61
EWDT: 232 ms
FS: 19 % — AB (ref 28–44)
Height: 72 in
IV/PV OW: 1.07
LA diam end sys: 38 mm
LA vol A4C: 68.6 ml
LADIAMINDEX: 1.63 cm/m2
LASIZE: 38 mm
LV E/e' medial: 10.61
LV e' LATERAL: 8.92 cm/s
LV sys vol index: 18 mL/m2
LVDIAVOL: 94 mL (ref 62–150)
LVDIAVOLIN: 40 mL/m2
LVEEAVG: 10.61
LVOT area: 3.14 cm2
LVOTD: 20 mm
LVSYSVOL: 42 mL (ref 21–61)
MV Dec: 232
MV Peak grad: 4 mmHg
MV pk E vel: 94.6 m/s
MVPKAVEL: 79.5 m/s
PW: 10.8 mm — AB (ref 0.6–1.1)
RV TAPSE: 17.5 mm
Reg peak vel: 158 cm/s
Simpson's disk: 55
Stroke v: 52 ml
TDI e' lateral: 8.92
TDI e' medial: 7.51
TR max vel: 158 cm/s
Weight: 3957.7 oz

## 2015-11-28 LAB — POCT I-STAT, CHEM 8
BUN: 18 mg/dL (ref 6–20)
CALCIUM ION: 1.13 mmol/L (ref 1.12–1.23)
CHLORIDE: 100 mmol/L — AB (ref 101–111)
Creatinine, Ser: 1.1 mg/dL (ref 0.61–1.24)
Glucose, Bld: 130 mg/dL — ABNORMAL HIGH (ref 65–99)
HEMATOCRIT: 33 % — AB (ref 39.0–52.0)
Hemoglobin: 11.2 g/dL — ABNORMAL LOW (ref 13.0–17.0)
Potassium: 3.9 mmol/L (ref 3.5–5.1)
SODIUM: 135 mmol/L (ref 135–145)
TCO2: 23 mmol/L (ref 0–100)

## 2015-11-28 LAB — MAGNESIUM
MAGNESIUM: 2.5 mg/dL — AB (ref 1.7–2.4)
Magnesium: 2.4 mg/dL (ref 1.7–2.4)

## 2015-11-28 LAB — CREATININE, SERUM
CREATININE: 1.14 mg/dL (ref 0.61–1.24)
GFR calc Af Amer: >60 mL/min (ref >60)

## 2015-11-28 LAB — HEMOGLOBIN A1C
Hgb A1c MFr Bld: 5.1 % (ref 4.8–5.6)
Mean Plasma Glucose: 100 mg/dL

## 2015-11-28 MED ORDER — INSULIN ASPART 100 UNIT/ML ~~LOC~~ SOLN
0.0000 [IU] | SUBCUTANEOUS | Status: DC
  Administered 2015-11-28: 4 [IU] via SUBCUTANEOUS
  Administered 2015-11-28 – 2015-11-29 (×2): 2 [IU] via SUBCUTANEOUS

## 2015-11-28 MED ORDER — ENOXAPARIN SODIUM 40 MG/0.4ML ~~LOC~~ SOLN
40.0000 mg | SUBCUTANEOUS | Status: DC
  Administered 2015-11-28 – 2015-11-30 (×3): 40 mg via SUBCUTANEOUS
  Filled 2015-11-28 (×3): qty 0.4

## 2015-11-28 NOTE — Progress Notes (Signed)
Patient ID: Justin ArntDavid L Makara, male   DOB: 10/20/1965, 50 y.o.   MRN: [id number] TCTS DAILY ICU PROGRESS NOTE                   [address]            [place] [id number]          [phone]   1 Day Post-Op Procedure(s) (LRB): CORONARY ARTERY BYPASS GRAFTING (CABG) x 5 (LIMA to LAD, SVG to DIAGONAL, SVG SEQUENTIALLY to OM1 and OM2, SVG to PDA) with EVH from right leg using greater saphenous vein and mammary. (N/A) INTRAOPERATIVE TRANSESOPHAGEAL ECHOCARDIOGRAM (N/A)  Total Length of Stay:  LOS: 2 days   Subjective: Alert , extubated neuro intact  Objective: Vital signs in last 24 hours: Temp:  [98.1 F (36.7 C)-99.5 F (37.5 C)] 98.2 F (36.8 C) (06/24 0708) Pulse Rate:  [78-90] 79 (06/24 0708) Cardiac Rhythm:  [-] Atrial paced (06/23 2000) Resp:  [12-40] 15 (06/24 0708) BP: (94-151)/(65-127) 103/66 mmHg (06/24 0700) SpO2:  [90 %-100 %] 95 % (06/24 0708) Arterial Line BP: (80-164)/(53-104) 88/53 mmHg (06/24 0708) FiO2 (%):  [40 %-50 %] 40 % (06/23 1544) Weight:  [247 lb 5.7 oz (112.2 kg)] 247 lb 5.7 oz (112.2 kg) (06/24 0700)  Filed Weights   11/26/15 0234 11/27/15 0612 11/28/15 0700  Weight: 233 lb 11.2 oz (106.006 kg) 231 lb 3.2 oz (104.872 kg) 247 lb 5.7 oz (112.2 kg)    Weight change: 16 lb 2.5 oz (7.328 kg)   Hemodynamic parameters for last 24 hours: PAP: (20-40)/(4-21) 28/18 mmHg CO:  [4.7 L/min-7.9 L/min] 7.1 L/min CI:  [2.1 L/min/m2-3.5 L/min/m2] 3.1 L/min/m2  Intake/Output from previous day: 06/23 0701 - 06/24 0700 In: 7341.2 [P.O.:150; I.V.:4901.2; Blood:1030; NG/GT:30; IV Piggyback:1230] Out: 5165 [Urine:3100; Emesis/NG output:100; Blood:1635; Chest Tube:330]  Intake/Output this shift:    Current Meds: Scheduled Meds: . acetaminophen  1,000 mg Oral Q6H   Or  . acetaminophen (TYLENOL) oral liquid 160 mg/5 mL  1,000 mg Per Tube Q6H  . antiseptic oral rinse  7 mL Mouth Rinse q12n4p  . aspirin EC  325 mg Oral Daily   Or  . aspirin  324  mg Per Tube Daily  . atorvastatin  80 mg Oral q1800  . bisacodyl  10 mg Oral Daily   Or  . bisacodyl  10 mg Rectal Daily  . cefUROXime (ZINACEF)  IV  1.5 g Intravenous Q12H  . chlorhexidine  15 mL Mouth Rinse BID  . docusate sodium  200 mg Oral Daily  . insulin regular  0-10 Units Intravenous TID WC  . ketorolac  15 mg Intravenous Q8H  . ketorolac      . metoprolol tartrate  12.5 mg Oral BID   Or  . metoprolol tartrate  12.5 mg Per Tube BID  . [START ON 11/29/2015] pantoprazole  40 mg Oral Daily  . sodium chloride flush  10-40 mL Intracatheter Q12H  . sodium chloride flush  3 mL Intravenous Q12H   Continuous Infusions: . sodium chloride 20 mL/hr at 11/28/15 0700  . sodium chloride    . sodium chloride 1 mL/hr at 11/28/15 0700  . dexmedetomidine Stopped (11/28/15 0700)  . DOPamine Stopped (11/27/15 1400)  . insulin (NOVOLIN-R) infusion Stopped (11/28/15 0407)  . lactated ringers Stopped (11/27/15 1700)  . lactated ringers 20 mL/hr at 11/28/15 0700  . nitroGLYCERIN Stopped (11/27/15 1400)  . phenylephrine (NEO-SYNEPHRINE) Adult infusion Stopped (11/27/15 1400)   PRN  Meds:.sodium chloride, albumin human, lactated ringers, metoprolol, morphine injection, ondansetron (ZOFRAN) IV, oxyCODONE, sodium chloride flush, sodium chloride flush, traMADol  General appearance: alert, cooperative and no distress Neurologic: intact Heart: regular rate and rhythm, S1, S2 normal, no murmur, click, rub or gallop Lungs: diminished breath sounds bibasilar Abdomen: soft, non-tender; bowel sounds normal; no masses,  no organomegaly Extremities: extremities normal, atraumatic, no cyanosis or edema and Homans sign is negative, no sign of DVT Wound: sternum stable  Lab Results: CBC: Recent Labs  11/27/15 2143 11/28/15 0411  WBC 8.9 8.1  HGB 11.6* 10.7*  HCT 33.8* 31.9*  PLT 128* 113*   BMET:  Recent Labs  11/27/15 0443  11/27/15 2133 11/27/15 2143 11/28/15 0411  NA 138  < > 139  --  136    K 3.8  < > 3.7  --  4.1  CL 109  < > 104  --  109  CO2 24  --   --   --  22  GLUCOSE 114*  < > 151*  --  96  BUN 13  < > 11  --  13  CREATININE 0.99  < > 0.90 0.97 1.05  CALCIUM 8.5*  --   --   --  7.6*  < > = values in this interval not displayed.  PT/INR:  Recent Labs  11/27/15 1407  LABPROT 18.8*  INR 1.57*   Radiology: Dg Chest Port 1 View  11/27/2015  CLINICAL DATA:  CABG. EXAM: PORTABLE CHEST 1 VIEW COMPARISON:  11/26/2015. FINDINGS: Endotracheal tube, NG tube, Swan-Ganz catheter, left chest tube in good anatomic position. Mild left lower lobe infiltrate. No pleural effusion or pneumothorax . Prior CABG. Mild cardiomegaly. Low lung volumes with mild bibasilar atelectasis and/or infiltrates . IMPRESSION: 1. Endotracheal tube, NG tube, Swan-Ganz catheter, left chest tube in good anatomic position. No pneumothorax. 2. Prior CABG. Mild cardiomegaly. Low lung volumes with mild basilar atelectasis and/or infiltrates. Electronically Signed   By: Maisie Fushomas  Register   On: 11/27/2015 14:21     Assessment/Plan: S/P Procedure(s) (LRB): CORONARY ARTERY BYPASS GRAFTING (CABG) x 5 (LIMA to LAD, SVG to DIAGONAL, SVG SEQUENTIALLY to OM1 and OM2, SVG to PDA) with EVH from right leg using greater saphenous vein and mammary. (N/A) INTRAOPERATIVE TRANSESOPHAGEAL ECHOCARDIOGRAM (N/A) Mobilize Diuresis d/c tubes/lines Continue foley due to strict I&O and urinary output monitoring See progression orders Expected Acute  Blood - loss Anemia    Delight Ovensdward B Summerlynn Glauser 11/28/2015 7:55 AM

## 2015-11-28 NOTE — Progress Notes (Signed)
Echocardiogram 2D Echocardiogram has been performed.  Dorothey BasemanReel, Eilidh Marcano M 11/28/2015, 3:30 PM

## 2015-11-28 NOTE — Op Note (Signed)
[name]:  Justin Villa, [name]              ACCOUNT NO.:  [id number]  MEDICAL RECORD NO.:  [id number]  LOCATION:  2S06C                        FACILITY:  [facility]  PHYSICIAN:  Sheliah PlaneEdward Sherilynn Dieu, MD    DATE OF BIRTH:  04-Mar-1966  DATE OF PROCEDURE:  11/27/2015 DATE OF DISCHARGE:                              OPERATIVE REPORT   PREOPERATIVE DIAGNOSIS:  Non-ST elevation myocardial infarction, recent myocardial infarction with three-vessel coronary artery disease.  POSTOPERATIVE DIAGNOSIS:  Non-ST elevation myocardial infarction, recent myocardial infarction with three-vessel coronary artery disease.  SURGICAL PROCEDURE:  Coronary artery bypass grafting x5 with the left internal mammary to the left anterior descending coronary artery, reverse saphenous vein graft to the diagonal coronary artery, sequential reverse saphenous vein graft to the first obtuse marginal, and circumflex coronary artery and reverse saphenous vein graft to the posterior descending coronary artery.  SURGEON:  Sheliah PlaneEdward Jessey Huyett, MD  FIRST ASSISTANT:  Doree Fudgeonielle Zimmerman, PA.  BRIEF HISTORY:  The patient is a 50 year old male with no history of diabetes, who presented with new onset of chest discomfort.  [facility] and was transferred [facility] for further evaluation.  Cardiac catheterization was performed by Dr.Smith ,the patient was found to have totally occluded mid LAD high-grade obstruction in large diagonal branches, high-grade obstruction in the proximal circumflex in each obtuse marginal branch, and high-grade obstruction in the mid and distal RCA. The RCA supplies the LAD via septal perforator collaterals. Normal left ventricular systolic function.  With patient's   symptoms and three-vessel coronary artery disease, coronary artery bypass grafting was recommended to the patient, who agreed and signed informed consent.  DESCRIPTION OF PROCEDURE:  With Swan-Ganz and arterial line  monitors in place, the patient underwent general endotracheal anesthesia without incident.  Skin of the chest and legs were prepped with Betadine and draped in usual sterile manner.  Appropriate time-out was performed. Then, we proceeded with endo vein harvesting of the right greater saphenous vein from the thigh and upper calf.  The vein was of excellent quality and caliber.  Median sternotomy was performed.  The left internal mammary artery was dissected down as a pedicle graft.  The distal artery was divided and had good free flow.  The pericardium was opened.  Overall, ventricular function appeared preserved.  The patient was systemically heparinized.  Ascending aorta was cannulated with a 22- French cannula because of the patient's large body size, and dual stage venous cannula was placed into the right atrium.  The patient was placed on cardiopulmonary bypass 2.4 L/min/m2.  Sites of anastomosis were selected and dissected out of the epicardium.  The patient's body temperature was cooled to 32 degrees.  Aortic crossclamp was applied and 500 mL of cold blood potassium cardioplegia was administered with diastolic arrest of the heart.  Myocardial septal temperature was monitored throughout the crossclamp.  Attention was turned first to the distal right coronary artery and posterior descending.  The distal right coronary artery was diffusely diseased and calcified in the proximal portion of the posterior descending, it was suitable to open.  The vessel was opened, admitted 1.5 mm probe.  Distally, using a running 7-0 Prolene, distal anastomosis was  performed with a second reverse saphenous vein graft.  The heart was then elevated and the first obtuse marginal vessel was identified and opened, admitted a 1.5 mm probe distally using running 7-0 side-to-side anastomosis, it was carried out with a second reverse saphenous vein graft.  The distal extent of the same vein was then carried at  short distance to the distal circumflex vessel, which was opened and admitted, it was slightly larger, but was somewhat thickened.  A 1.5 mm probe passed distally.  Using a running 7- 0 Prolene, distal anastomosis was performed with the distal end of the vein graft that had been sewed side-to-side to the first OM.  Additional cold blood cardioplegia was administered down the vein grafts.  The diagonal coronary artery was then identified, opened, and was 1.2-1.3 mm in size.  Using a running 7-0 Prolene, distal anastomosis was performed. Attention was then turned to the left anterior descending coronary artery, which was opened and admitted to 1.5 mm probe distally.  The vessel was opened proximally between the mid and distal third.  Using a running 8-0 Prolene, left internal mammary artery was anastomosed to the left anterior descending coronary artery.  With release of the bulldog on the mammary artery, there was rise in myocardial septal temperature. The bulldog was placed back on the mammary artery.  With the crossclamp still in place, three punch aortotomies were performed and each of the 3 vein grafts were anastomosed to the ascending aorta.  The heart was allowed to passively fill and de-air.  The bulldog on the mammary artery was removed with prompt rise in myocardial septal temperature.  Aortic crossclamp was then removed and the proximal anastomosis were completed. Initially, the patient was in complete heart block and transiently required AV pacing, this resolved and back to a sinus rhythm, with the body temperature rewarmed to 37 degrees.  The patient was then ventilated and weaned from cardiopulmonary bypass without difficulty. He remained hemodynamically stable.  He was decannulated in usual fashion.  Protamine sulfate was administered with operative field hemostatic.  Atrial and ventricular pacing wires had been applied. Graft markers were applied.  The left pleural tube and  Blake mediastinal drain were left in place.  Pericardium was loosely reapproximated. Sternum was closed with #6 stainless steel wire.  Fascia was closed with interrupted 0 Vicryl and 3-0 Vicryl subcutaneous tissue, and 3-0 subcuticular stitch in skin edges.  Dry dressings were applied.  Sponge and needle count was reported as correct at completion of the procedure. The patient tolerated the procedure without obvious complication and was transferred to Surgical Intensive care Unit for further postoperative care.  He did not require any blood bank blood products during the operative procedure.     Sheliah PlaneEdward Dominyk Law, MD     EG/MEDQ  D:  11/28/2015  T:  11/28/2015  Job:  [id number]

## 2015-11-28 NOTE — Progress Notes (Signed)
Patient ID: Justin Villa, male   DOB: 05/07/1966, 50 y.o.   MRN: [id number] EVENING ROUNDS NOTE :     [address]       [place] [id number]             [phone]                 1 Day Post-Op Procedure(s) (LRB): CORONARY ARTERY BYPASS GRAFTING (CABG) x 5 (LIMA to LAD, SVG to DIAGONAL, SVG SEQUENTIALLY to OM1 and OM2, SVG to PDA) with EVH from right leg using greater saphenous vein and mammary. (N/A) INTRAOPERATIVE TRANSESOPHAGEAL ECHOCARDIOGRAM (N/A)  Total Length of Stay:  LOS: 2 days  BP 146/88 mmHg  Pulse 79  Temp(Src) 98.2 F (36.8 C) (Oral)  Resp 25  Ht 6' (1.829 m)  Wt 247 lb 5.7 oz (112.2 kg)  BMI 33.54 kg/m2  SpO2 95%  .Intake/Output      06/24 0701 - 06/25 0700   P.O. 1920   I.V. (mL/kg)    Blood    NG/GT    IV Piggyback 50   Total Intake(mL/kg) 1970 (17.6)   Urine (mL/kg/hr) 700 (0.5)   Emesis/NG output    Blood    Chest Tube    Total Output 700   Net +1270         . sodium chloride 20 mL/hr at 11/28/15 0700  . sodium chloride    . sodium chloride 1 mL/hr at 11/28/15 0700  . lactated ringers Stopped (11/27/15 1700)  . lactated ringers 20 mL/hr at 11/28/15 0700  . nitroGLYCERIN Stopped (11/27/15 1400)  . phenylephrine (NEO-SYNEPHRINE) Adult infusion Stopped (11/27/15 1400)     Lab Results  Component Value Date   WBC 10.2 11/28/2015   HGB 11.2* 11/28/2015   HCT 33.0* 11/28/2015   PLT 135* 11/28/2015   GLUCOSE 130* 11/28/2015   CHOL 158 11/26/2015   TRIG 135 11/26/2015   HDL 35* 11/26/2015   LDLCALC 96 11/26/2015   ALT 46 11/26/2015   AST 28 11/26/2015   NA 135 11/28/2015   K 3.9 11/28/2015   CL 100* 11/28/2015   CREATININE 1.10 11/28/2015   BUN 18 11/28/2015   CO2 22 11/28/2015   INR 1.57* 11/27/2015   HGBA1C 5.1 11/27/2015   Some dizziness, ambulated twice around the unit Foley out   Delight OvensEdward B Guiseppe Flanagan MD  Beeper [phone] Office [phone] 11/28/2015 7:26 PM

## 2015-11-29 ENCOUNTER — Inpatient Hospital Stay (HOSPITAL_COMMUNITY): Payer: PRIVATE HEALTH INSURANCE

## 2015-11-29 LAB — CBC
HCT: 33.5 % — ABNORMAL LOW (ref 39.0–52.0)
Hemoglobin: 11.3 g/dL — ABNORMAL LOW (ref 13.0–17.0)
MCH: 28.3 pg (ref 26.0–34.0)
MCHC: 33.7 g/dL (ref 30.0–36.0)
MCV: 83.8 fL (ref 78.0–100.0)
Platelets: 156 10*3/uL (ref 150–400)
RBC: 4 MIL/uL — ABNORMAL LOW (ref 4.22–5.81)
RDW: 14.2 % (ref 11.5–15.5)
WBC: 10.7 10*3/uL — ABNORMAL HIGH (ref 4.0–10.5)

## 2015-11-29 LAB — BASIC METABOLIC PANEL
Anion gap: 4 — ABNORMAL LOW (ref 5–15)
BUN: 17 mg/dL (ref 6–20)
CO2: 26 mmol/L (ref 22–32)
Calcium: 7.9 mg/dL — ABNORMAL LOW (ref 8.9–10.3)
Chloride: 102 mmol/L (ref 101–111)
Creatinine, Ser: 1.03 mg/dL (ref 0.61–1.24)
GFR calc Af Amer: >60 mL/min (ref >60)
GFR calc non Af Amer: >60 mL/min (ref >60)
Glucose, Bld: 136 mg/dL — ABNORMAL HIGH (ref 65–99)
Potassium: 3.8 mmol/L (ref 3.5–5.1)
Sodium: 132 mmol/L — ABNORMAL LOW (ref 135–145)

## 2015-11-29 LAB — GLUCOSE, CAPILLARY
GLUCOSE-CAPILLARY: 104 mg/dL — AB (ref 65–99)
Glucose-Capillary: 127 mg/dL — ABNORMAL HIGH (ref 65–99)
Glucose-Capillary: 109 mg/dL — ABNORMAL HIGH (ref 65–99)

## 2015-11-29 MED ORDER — ASPIRIN EC 325 MG PO TBEC
325.0000 mg | DELAYED_RELEASE_TABLET | Freq: Every day | ORAL | Status: DC
  Administered 2015-11-29 – 2015-11-30 (×2): 325 mg via ORAL
  Filled 2015-11-29 (×2): qty 1

## 2015-11-29 MED ORDER — TRAMADOL HCL 50 MG PO TABS: 50.0000 mg | ORAL_TABLET | ORAL | Status: DC | PRN

## 2015-11-29 MED ORDER — OXYCODONE HCL 5 MG PO TABS: 5.0000 mg | ORAL_TABLET | ORAL | Status: DC | PRN

## 2015-11-29 MED ORDER — SODIUM CHLORIDE 0.9 % IV SOLN: 250.0000 mL | INTRAVENOUS | Status: DC | PRN

## 2015-11-29 MED ORDER — BISACODYL 5 MG PO TBEC: 10.0000 mg | DELAYED_RELEASE_TABLET | Freq: Every day | ORAL | Status: DC | PRN

## 2015-11-29 MED ORDER — LISINOPRIL 2.5 MG PO TABS
2.5000 mg | ORAL_TABLET | Freq: Every day | ORAL | Status: DC
  Administered 2015-11-29: 2.5 mg via ORAL
  Filled 2015-11-29: qty 1

## 2015-11-29 MED ORDER — SODIUM CHLORIDE 0.9% FLUSH
3.0000 mL | Freq: Two times a day (BID) | INTRAVENOUS | Status: DC
  Administered 2015-11-29 – 2015-11-30 (×4): 3 mL via INTRAVENOUS

## 2015-11-29 MED ORDER — ONDANSETRON HCL 4 MG/2ML IJ SOLN: 4.0000 mg | Freq: Four times a day (QID) | INTRAMUSCULAR | Status: DC | PRN

## 2015-11-29 MED ORDER — ONDANSETRON HCL 4 MG PO TABS: 4.0000 mg | ORAL_TABLET | Freq: Four times a day (QID) | ORAL | Status: DC | PRN

## 2015-11-29 MED ORDER — BISACODYL 10 MG RE SUPP: 10.0000 mg | Freq: Every day | RECTAL | Status: DC | PRN

## 2015-11-29 MED ORDER — METOPROLOL TARTRATE 25 MG PO TABS
25.0000 mg | ORAL_TABLET | Freq: Two times a day (BID) | ORAL | Status: DC
  Administered 2015-11-29 – 2015-11-30 (×3): 25 mg via ORAL
  Filled 2015-11-29 (×3): qty 1

## 2015-11-29 MED ORDER — METOPROLOL TARTRATE 12.5 MG HALF TABLET
12.5000 mg | ORAL_TABLET | Freq: Once | ORAL | Status: AC
  Administered 2015-11-29: 12.5 mg via ORAL
  Filled 2015-11-29: qty 1

## 2015-11-29 MED ORDER — GUAIFENESIN ER 600 MG PO TB12: 600.0000 mg | ORAL_TABLET | Freq: Two times a day (BID) | ORAL | Status: DC | PRN

## 2015-11-29 MED ORDER — DOCUSATE SODIUM 100 MG PO CAPS
200.0000 mg | ORAL_CAPSULE | Freq: Every day | ORAL | Status: DC
  Administered 2015-11-29 – 2015-12-01 (×2): 200 mg via ORAL
  Filled 2015-11-29 (×3): qty 2

## 2015-11-29 MED ORDER — MOVING RIGHT ALONG BOOK
Freq: Once | Status: AC
  Administered 2015-11-29: 08:00:00
  Filled 2015-11-29: qty 1

## 2015-11-29 MED ORDER — SODIUM CHLORIDE 0.9% FLUSH: 3.0000 mL | INTRAVENOUS | Status: DC | PRN

## 2015-11-29 MED ORDER — ACETAMINOPHEN 325 MG PO TABS
650.0000 mg | ORAL_TABLET | Freq: Four times a day (QID) | ORAL | Status: DC | PRN
  Administered 2015-11-29 – 2015-11-30 (×3): 650 mg via ORAL
  Filled 2015-11-29 (×3): qty 2

## 2015-11-29 MED ORDER — METOPROLOL TARTRATE 12.5 MG HALF TABLET
12.5000 mg | ORAL_TABLET | Freq: Two times a day (BID) | ORAL | Status: DC
  Administered 2015-11-29: 12.5 mg via ORAL
  Filled 2015-11-29: qty 1

## 2015-11-29 NOTE — Progress Notes (Signed)
Patient ID: Justin ArntDavid L Villa, male   DOB: 07/23/1965, 50 y.o.   MRN: [id number] TCTS DAILY ICU PROGRESS NOTE                   [address]            [place] [id number]          [phone]   2 Days Post-Op Procedure(s) (LRB): CORONARY ARTERY BYPASS GRAFTING (CABG) x 5 (LIMA to LAD, SVG to DIAGONAL, SVG SEQUENTIALLY to OM1 and OM2, SVG to PDA) with EVH from right leg using greater saphenous vein and mammary. (N/A) INTRAOPERATIVE TRANSESOPHAGEAL ECHOCARDIOGRAM (N/A)  Total Length of Stay:  LOS: 3 days   Subjective: Mild hallucinations with pain meds  last pm, feels well now, ambulating  Objective: Vital signs in last 24 hours: Temp:  [97.5 F (36.4 C)-98.4 F (36.9 C)] 97.8 F (36.6 C) (06/25 0749) Pulse Rate:  [70-91] 74 (06/25 0600) Cardiac Rhythm:  [-] Normal sinus rhythm (06/25 0400) Resp:  [18-31] 18 (06/25 0600) BP: (104-149)/(67-116) 141/94 mmHg (06/25 0600) SpO2:  [93 %-99 %] 97 % (06/25 0600) Arterial Line BP: (107-124)/(62-73) 124/73 mmHg (06/24 0900) Weight:  [242 lb 14.4 oz (110.179 kg)] 242 lb 14.4 oz (110.179 kg) (06/25 0328)  Filed Weights   11/27/15 0612 11/28/15 0700 11/29/15 0328  Weight: 231 lb 3.2 oz (104.872 kg) 247 lb 5.7 oz (112.2 kg) 242 lb 14.4 oz (110.179 kg)    Weight change: -4 lb 7.3 oz (-2.021 kg)   Hemodynamic parameters for last 24 hours: PAP: (37-38)/(22-25) 38/25 mmHg  Intake/Output from previous day: 06/24 0701 - 06/25 0700 In: 2020 [P.O.:1920; IV Piggyback:100] Out: 1125 [Urine:1125]  Intake/Output this shift:    Current Meds: Scheduled Meds: . acetaminophen  1,000 mg Oral Q6H   Or  . acetaminophen (TYLENOL) oral liquid 160 mg/5 mL  1,000 mg Per Tube Q6H  . aspirin EC  325 mg Oral Daily   Or  . aspirin  324 mg Per Tube Daily  . atorvastatin  80 mg Oral q1800  . bisacodyl  10 mg Oral Daily   Or  . bisacodyl  10 mg Rectal Daily  . cefUROXime (ZINACEF)  IV  1.5 g Intravenous Q12H  . docusate sodium  200 mg  Oral Daily  . enoxaparin (LOVENOX) injection  40 mg Subcutaneous Q24H  . insulin aspart  0-24 Units Subcutaneous Q4H  . ketorolac  15 mg Intravenous Q8H  . metoprolol tartrate  12.5 mg Oral BID   Or  . metoprolol tartrate  12.5 mg Per Tube BID  . pantoprazole  40 mg Oral Daily  . sodium chloride flush  10-40 mL Intracatheter Q12H  . sodium chloride flush  3 mL Intravenous Q12H   Continuous Infusions: . sodium chloride 20 mL/hr at 11/28/15 0700  . sodium chloride    . sodium chloride 1 mL/hr at 11/28/15 0700  . lactated ringers Stopped (11/27/15 1700)  . lactated ringers 20 mL/hr at 11/28/15 0700  . nitroGLYCERIN Stopped (11/27/15 1400)  . phenylephrine (NEO-SYNEPHRINE) Adult infusion Stopped (11/27/15 1400)   PRN Meds:.sodium chloride, lactated ringers, metoprolol, morphine injection, ondansetron (ZOFRAN) IV, oxyCODONE, sodium chloride flush, sodium chloride flush, traMADol  General appearance: alert and cooperative Neurologic: intact Heart: regular rate and rhythm, S1, S2 normal, no murmur, click, rub or gallop Lungs: clear to auscultation bilaterally Abdomen: soft, non-tender; bowel sounds normal; no masses,  no organomegaly Extremities: extremities normal, atraumatic, no cyanosis or edema and  Homans sign is negative, no sign of DVT Wound: sternum intact  Lab Results: CBC: Recent Labs  11/28/15 1700 11/28/15 1715 11/29/15 0345  WBC 10.2  --  10.7*  HGB 11.2* 11.2* 11.3*  HCT 33.2* 33.0* 33.5*  PLT 135*  --  156   BMET:  Recent Labs  11/28/15 0411  11/28/15 1715 11/29/15 0345  NA 136  --  135 132*  K 4.1  --  3.9 3.8  CL 109  --  100* 102  CO2 22  --   --  26  GLUCOSE 96  --  130* 136*  BUN 13  --  18 17  CREATININE 1.05  < > 1.10 1.03  CALCIUM 7.6*  --   --  7.9*  < > = values in this interval not displayed.  PT/INR:  Recent Labs  11/27/15 1407  LABPROT 18.8*  INR 1.57*   Radiology: Dg Chest Port 1 View  11/29/2015  CLINICAL DATA:  Post open heart  surgery, history hypertension, coronary artery disease EXAM: PORTABLE CHEST 1 VIEW COMPARISON:  Portable exam 0452 hours compared to 11/28/2015 FINDINGS: Interval removal of Swan-Ganz catheter and LEFT thoracostomy tube. RIGHT jugular central venous catheter with tip projecting over proximal SVC. Minimal enlargement of cardiac silhouette post CABG. Stable mediastinal contours. Decreased pulmonary vascular congestion. Improved bibasilar aeration with mild residual subsegmental atelectasis. No pleural effusion or pneumothorax. IMPRESSION: Improved bibasilar atelectasis. Electronically Signed   By: Ulyses SouthwardMark  Boles M.D.   On: 11/29/2015 07:35     Assessment/Plan: S/P Procedure(s) (LRB): CORONARY ARTERY BYPASS GRAFTING (CABG) x 5 (LIMA to LAD, SVG to DIAGONAL, SVG SEQUENTIALLY to OM1 and OM2, SVG to PDA) with EVH from right leg using greater saphenous vein and mammary. (N/A) INTRAOPERATIVE TRANSESOPHAGEAL ECHOCARDIOGRAM (N/A) Mobilize Diuresis Plan for transfer to step-down: see transfer orders Expected Acute  Blood - loss Anemia    Delight Ovensdward B Petra Sargeant 11/29/2015 7:58 AM

## 2015-11-29 NOTE — Progress Notes (Signed)
MD paged about increases BP, orders given to adjust meds. MD ok with pt transferring to 2W.  Will cont to monitor and assess

## 2015-11-30 ENCOUNTER — Inpatient Hospital Stay (HOSPITAL_COMMUNITY): Payer: PRIVATE HEALTH INSURANCE

## 2015-11-30 ENCOUNTER — Encounter (HOSPITAL_COMMUNITY): Payer: Self-pay | Admitting: Cardiothoracic Surgery

## 2015-11-30 DIAGNOSIS — E785 Hyperlipidemia, unspecified: Secondary | ICD-10-CM

## 2015-11-30 DIAGNOSIS — I119 Hypertensive heart disease without heart failure: Secondary | ICD-10-CM

## 2015-11-30 LAB — CBC
HCT: 33.8 % — ABNORMAL LOW (ref 39.0–52.0)
Hemoglobin: 11.6 g/dL — ABNORMAL LOW (ref 13.0–17.0)
MCH: 28.6 pg (ref 26.0–34.0)
MCHC: 34.3 g/dL (ref 30.0–36.0)
MCV: 83.5 fL (ref 78.0–100.0)
Platelets: 154 10*3/uL (ref 150–400)
RBC: 4.05 MIL/uL — ABNORMAL LOW (ref 4.22–5.81)
RDW: 13.9 % (ref 11.5–15.5)
WBC: 9.6 10*3/uL (ref 4.0–10.5)

## 2015-11-30 LAB — BASIC METABOLIC PANEL
Anion gap: 5 (ref 5–15)
BUN: 14 mg/dL (ref 6–20)
CO2: 26 mmol/L (ref 22–32)
Calcium: 8.2 mg/dL — ABNORMAL LOW (ref 8.9–10.3)
Chloride: 102 mmol/L (ref 101–111)
Creatinine, Ser: 1 mg/dL (ref 0.61–1.24)
GFR calc Af Amer: >60 mL/min (ref >60)
GFR calc non Af Amer: >60 mL/min (ref >60)
Glucose, Bld: 136 mg/dL — ABNORMAL HIGH (ref 65–99)
Potassium: 4.5 mmol/L (ref 3.5–5.1)
Sodium: 133 mmol/L — ABNORMAL LOW (ref 135–145)

## 2015-11-30 MED ORDER — CLOPIDOGREL BISULFATE 75 MG PO TABS
75.0000 mg | ORAL_TABLET | Freq: Every day | ORAL | Status: DC
Start: 2015-11-30 — End: 2015-12-01
  Administered 2015-11-30 – 2015-12-01 (×2): 75 mg via ORAL
  Filled 2015-11-30 (×2): qty 1

## 2015-11-30 MED ORDER — LISINOPRIL 10 MG PO TABS
10.0000 mg | ORAL_TABLET | Freq: Every day | ORAL | Status: DC
  Administered 2015-11-30: 10 mg via ORAL
  Filled 2015-11-30: qty 1

## 2015-11-30 MED ORDER — FUROSEMIDE 40 MG PO TABS
40.0000 mg | ORAL_TABLET | Freq: Once | ORAL | Status: AC
  Administered 2015-11-30: 40 mg via ORAL
  Filled 2015-11-30: qty 1

## 2015-11-30 MED ORDER — FUROSEMIDE 40 MG PO TABS
40.0000 mg | ORAL_TABLET | Freq: Every day | ORAL | Status: DC
  Administered 2015-12-01: 40 mg via ORAL
  Filled 2015-11-30: qty 1

## 2015-11-30 MED ORDER — LISINOPRIL 10 MG PO TABS
20.0000 mg | ORAL_TABLET | Freq: Every day | ORAL | Status: DC
Start: 2015-12-01 — End: 2015-12-01
  Administered 2015-12-01: 20 mg via ORAL
  Filled 2015-11-30: qty 2

## 2015-11-30 NOTE — Progress Notes (Signed)
Pacer wires were removed and were intact. Patient has been instructed to remain on bedrest for one hour. Vitals will be taken every 15 minutes. Will continue to monitor.

## 2015-11-30 NOTE — Progress Notes (Addendum)
      [address]       [place] [id number]             [phone]        3 Days Post-Op Procedure(s) (LRB): CORONARY ARTERY BYPASS GRAFTING (CABG) x 5 (LIMA to LAD, SVG to DIAGONAL, SVG SEQUENTIALLY to OM1 and OM2, SVG to PDA) with EVH from right leg using greater saphenous vein and mammary. (N/A) INTRAOPERATIVE TRANSESOPHAGEAL ECHOCARDIOGRAM (N/A)  Subjective: Patient without specific complaints. Looking forward to going home.  Objective: Vital signs in last 24 hours: Temp:  [99.3 F (37.4 C)-99.6 F (37.6 C)] 99.6 F (37.6 C) (06/26 0524) Pulse Rate:  [77-104] 104 (06/26 0933) Cardiac Rhythm:  [-] Normal sinus rhythm (06/26 0700) Resp:  [18-31] 19 (06/26 0524) BP: (140-172)/(84-104) 140/100 mmHg (06/26 0933) SpO2:  [90 %-97 %] 94 % (06/26 0524) Weight:  [237 lb 6.4 oz (107.684 kg)] 237 lb 6.4 oz (107.684 kg) (06/26 0524)  Pre op weight 105 kg Current Weight  11/30/15 237 lb 6.4 oz (107.684 kg)      Intake/Output from previous day: 06/25 0701 - 06/26 0700 In: 500 [P.O.:500] Out: -    Physical Exam:  Cardiovascular: RRR Pulmonary: Slightly diminished at bases; no rales, wheezes, or rhonchi. Abdomen: Soft, non tender, bowel sounds present. Extremities: Mild bilateral lower extremity edema. Wounds: Aquacel removed. Sternal wound is clean and dry.  No erythema or signs of infection.  Lab Results: CBC: Recent Labs  11/29/15 0345 11/30/15 0214  WBC 10.7* 9.6  HGB 11.3* 11.6*  HCT 33.5* 33.8*  PLT 156 154   BMET:  Recent Labs  11/29/15 0345 11/30/15 0214  NA 132* 133*  K 3.8 4.5  CL 102 102  CO2 26 26  GLUCOSE 136* 136*  BUN 17 14  CREATININE 1.03 1.00  CALCIUM 7.9* 8.2*    PT/INR:  Lab Results  Component Value Date   INR 1.57* 11/27/2015   INR 1.26 11/26/2015   ABG:  INR: Will add last result for INR, ABG once components are confirmed Will add last 4 CBG results once components are confirmed  Assessment/Plan:  1. CV  - S/p NSTEMI.  Is still hypertensive. On Lopressor 25 mg bid,, Plavix 75 mg daily, and Lisinopril 20 mg daily. Will increase Lopressor to 50 mg bid. Monitor BP this am. 2.  Pulmonary - On room air. CXR this am shows mild left base atelectasis, small left pleural effusion, stable cardiomegaly, no pneumothorax. Encourage incentive spirometer. 3. Volume Overload - Will give Lasix 40 mg daily 4.  Acute blood loss anemia - H and H stable at 11.6 and 33.8 5. Discharge later today  Tamrah Victorino MPA-C 11/30/2015,11:48 AM

## 2015-11-30 NOTE — Discharge Instructions (Signed)
Coronary Artery Bypass Grafting, Care After °Refer to this sheet in the next few weeks. These instructions provide you with information on caring for yourself after your procedure. Your health care provider may also give you more specific instructions. Your treatment has been planned according to current medical practices, but problems sometimes occur. Call your health care provider if you have any problems or questions after your procedure. °WHAT TO EXPECT AFTER THE PROCEDURE °Recovery from surgery will be different for everyone. Some people feel well after 3 or 4 weeks, while for others it takes longer. After your procedure, it is typical to have the following: °· Nausea and a lack of appetite.   °· Constipation. °· Weakness and fatigue.   °· Depression or irritability.   °· Pain or discomfort at your incision site. °HOME CARE INSTRUCTIONS °· Take medicines only as directed by your health care provider. Do not stop taking medicines or start any new medicines without first checking with your health care provider. °· Take your pulse as directed by your health care provider. °· Perform deep breathing as directed by your health care provider. If you were given a device called an incentive spirometer, use it to practice deep breathing several times a day. Support your chest with a pillow or your arms when you take deep breaths or cough. °· Keep incision areas clean, dry, and protected. Remove or change any bandages (dressings) only as directed by your health care provider. You may have skin adhesive strips over the incision areas. Do not take the strips off. They will fall off on their own. °· Check incision areas daily for any swelling, redness, or drainage. °· If incisions were made in your legs, do the following: °¨ Avoid crossing your legs.   °¨ Avoid sitting for long periods of time. Change positions every 30 minutes.   °¨ Elevate your legs when you are sitting. °· Wear compression stockings as directed by your  health care provider. These stockings help keep blood clots from forming in your legs. °· Take showers once your health care provider approves. Until then, only take sponge baths. Pat incisions dry. Do not rub incisions with a washcloth or towel. Do not take baths, swim, or use a hot tub until your health care provider approves. °· Eat foods that are high in fiber, such as raw fruits and vegetables, whole grains, beans, and nuts. Meats should be lean cut. Avoid canned, processed, and fried foods. °· Drink enough fluid to keep your urine clear or pale yellow. °· Weigh yourself every day. This helps identify if you are retaining fluid that may make your heart and lungs work harder. °· Rest and limit activity as directed by your health care provider. You may be instructed to: °¨ Stop any activity at once if you have chest pain, shortness of breath, irregular heartbeats, or dizziness. Get help right away if you have any of these symptoms. °¨ Move around frequently for short periods or take short walks as directed by your health care provider. Increase your activities gradually. You may need physical therapy or cardiac rehabilitation to help strengthen your muscles and build your endurance. °¨ Avoid lifting, pushing, or pulling anything heavier than 10 lb (4.5 kg) for at least 6 weeks after surgery. °· Do not drive until your health care provider approves.  °· Ask your health care provider when you may return to work. °· Ask your health care provider when you may resume sexual activity. °· Keep all follow-up visits as directed by your health care   provider. This is important. °SEEK MEDICAL CARE IF: °· You have swelling, redness, increasing pain, or drainage at the site of an incision. °· You have a fever. °· You have swelling in your ankles or legs. °· You have pain in your legs.   °· You gain 2 or more pounds (0.9 kg) a day. °· You are nauseous or vomit. °· You have diarrhea.  °SEEK IMMEDIATE MEDICAL CARE IF: °· You have  chest pain that goes to your jaw or arms. °· You have shortness of breath.   °· You have a fast or irregular heartbeat.   °· You notice a "clicking" in your breastbone (sternum) when you move.   °· You have numbness or weakness in your arms or legs. °· You feel dizzy or light-headed.   °MAKE SURE YOU: °· Understand these instructions. °· Will watch your condition. °· Will get help right away if you are not doing well or get worse. °  °This information is not intended to replace advice given to you by your health care provider. Make sure you discuss any questions you have with your health care provider. °  °Document Released: 12/10/2004 Document Revised: 06/13/2014 Document Reviewed: 10/30/2012 °Elsevier Interactive Patient Education ©2016 [employer] ° °

## 2015-11-30 NOTE — Progress Notes (Signed)
CARDIAC REHAB PHASE I   PRE:  Rate/Rhythm: 105 ST    BP: sitting 150/100    SaO2: 97 RA  MODE:  Ambulation: 450 ft   POST:  Rate/Rhythm: 120 ST    BP: sitting 140/100     SaO2: 95 RA  Pt up, moving around room, looks good. However slight confusion noted, he thought he was holding socks but really holding a hot pack. Able to walk steady, no assist needed. HR and BP elevated, noted SOB, he denies this. To recliner after walk. Needed instruction on IS use, inspiring 1000 ml. RN present, giving BP meds. Encouraged x2 more walks, pt likes to walk, can walk independently. [phone]   Harriet MassonRandi Kristan Joline Encalada CES, ACSM 11/30/2015 9:44 AM

## 2015-11-30 NOTE — Discharge Summary (Signed)
Physician Discharge Summary       [address]       [place] [id number]             [phone]    Patient ID: Justin ArntDavid L Hadsall MRN: [id number] DOB/AGE: 52/08/1965 52 y.o.  Admit date: 11/25/2015 Discharge date: 12/01/2015  Admission Diagnoses: 1. S/p NSTEMI 2. CAD(coronary artery disease)  Active Diagnoses:  1. Hypertension 2. Mild ABL anemia  Procedure (s):  Left Heart Cath and Coronary Angiography by Dr. Katrinka BlazingSmith on 11/26/2015:    Conclusion    1. Mid RCA lesion, 85% stenosed. 2. 1st Mrg lesion, 85% stenosed. 3. Ost Cx to Mid Cx lesion, 95% stenosed. 4. Ost 2nd Mrg to 2nd Mrg lesion, 90% stenosed. 5. Mid Cx to Dist Cx lesion, 65% stenosed. 6. Ost 1st Diag to 1st Diag lesion, 70% stenosed. 7. Ost 2nd Diag to 2nd Diag lesion, 95% stenosed. 8. Mid LAD to Dist LAD lesion, 100% stenosed. 9. Ost LAD to Mid LAD lesion, 50% stenosed. 10. Dist RCA lesion, 75% stenosed. 11. RPDA-1 lesion, 40% stenosed. 12. RPDA-2 lesion, 50% stenosed. 13. 2nd RPLB lesion, 50% stenosed.   Severe three-vessel coronary disease as outlined above with totally occluded mid LAD high-grade obstruction in large diagonal branches, high-grade obstruction in the proximal circumflex in each obtuse marginal branch, and high-grade obstruction in the mid and distal RCA. The RCA supplies the LAD via septal perforator collaterals.  Normal left ventricular systolic function. Normal left ventricular filling pressures.   RECOMMENDATIONS:   Cardiovascular surgical evaluation for multivessel bypass surgery.  Resume heparin.  Remain hospitalized until surgery can be performed.    Coronary artery bypass grafting x5 with the left internal mammary to the left anterior descending coronary artery, reverse saphenous vein graft to the diagonal coronary artery, sequential reverse saphenous vein graft to the first obtuse marginal, and circumflex coronary artery and reverse saphenous vein graft to  the posterior descending coronary artery by Dr. Tyrone SageGerhardt on 11/27/2015.  History of Presenting Illness: This is a 50 yo obese male with known history of hypertension; however, he is not taking medications. [facility] after presenting to [facility] with complaints of unstable angina over the last week. Workup in the ED showed subtle ST depression, which was concerning for high grade stenosis vs. demand ischemia from accelerated HTN. His Troponin level was also mildly elevated. He also has a positive family history of CAD. He underwent cardiac catheterization which showed severe 3 vessel CAD with a preserved EF. It was felt coronary bypass grafting would be indicated and TCTS consult was obtained. He was placed on Heparin and NTG and admitted for further care. Currently, the patient denies chest pain. He states his main symptom has been shortness of breath which has been occurring for a while. He does not smoke and denies a history of DM. Dr. Tyrone SageGerhardt discussed the need for coronary artery bypass grafting surgery. Potential risks, benefits, and complications of the surgery were discussed with the patient and he agreed to proceed with surgery. Pre opertaive carotid duplex US showed no significant internal carotid artery stenosis bilaterally. He underwent a CABG x 5 on 11/27/2015.  Brief Hospital Course:  The patient was extubated the evening of surgery without difficulty. He remained afebrile and hemodynamically stable. Theone MurdochSwan Ganz, a line, chest tubes, and foley were removed early in the post operative course. Lopressor was started and titrated accordingly. He was volume over loaded and diuresed. He had ABL  anemia. He did not require a post op transfusion. His last H and H was stable at 11.6 and 33.8.He was weaned ff the insulin drip. The patient's HGA1C pre op was 5.1. The patient was felt surgically stable for transfer from the ICU to PCTU for further convalescence on  11/29/2015. He continues to progress with cardiac rehab. He was ambulating on room air. He has been tolerating a diet and has had a bowel movement. Epicardial pacing wires were removed on 06/26. Because of his NSTEMI, he was put on Plavix 75 mg daily and his enteric coated aspirin to 81 mg daily. He continue to by hypertensive so his Lopressor was increased to 50 mg bid and Lisinopril to 40 mg daily. Patient was explained the importance of better BP control. He was instructed to follow up with cardiologist and or medical doctor within 2 weeks after discharge. Chest tube sutures will be removed today on the day of discharge. The patient is felt surgically stable for discharge today.   Latest Vital Signs: Blood pressure 158/92, pulse 93, temperature 98.2 F (36.8 C), temperature source Oral, resp. rate 20, height 6' (1.829 m), weight 229 lb 8 oz (104.101 kg), SpO2 95 %.  Physical Exam: Cardiovascular: RRR Pulmonary: Slightly diminished at bases; no rales, wheezes, or rhonchi. Abdomen: Soft, non tender, bowel sounds present. Extremities: Mild bilateral lower extremity edema. Wounds: Sternal wound is clean and dry. No erythema or signs of infection.  Discharge Condition:Stable and discharged to home.  Recent laboratory studies:  Lab Results  Component Value Date   WBC 9.6 11/30/2015   HGB 11.6* 11/30/2015   HCT 33.8* 11/30/2015   MCV 83.5 11/30/2015   PLT 154 11/30/2015   Lab Results  Component Value Date   NA 133* 11/30/2015   K 4.5 11/30/2015   CL 102 11/30/2015   CO2 26 11/30/2015   CREATININE 1.00 11/30/2015   GLUCOSE 136* 11/30/2015    Diagnostic Studies: Dg Chest 2 View  11/30/2015  CLINICAL DATA:  Prior cardiac surgery. EXAM: CHEST  2 VIEW COMPARISON:  11/29/2015. FINDINGS: Interval removal Swan-Ganz catheter. Prior CABG. Cardiomegaly. Pulmonary vascularity is normal . Mild left base subsegmental atelectasis and or infiltrate, improved from prior exam. No pleural effusion or  pneumothorax . IMPRESSION: 1.  Interim removal of Swan-Ganz catheter. 2. Mild left base subsegmental atelectasis and or infiltrate, improved from prior exam. Small left pleural effusion. 3. Prior CABG. Stable cardiomegaly. No pulmonary venous congestion . Electronically Signed   By: Maisie Fushomas  Register   On: 11/30/2015 07:42   Discharge Medications:   Medication List    TAKE these medications        acetaminophen 325 MG tablet  Commonly known as:  TYLENOL  Take 2 tablets (650 mg total) by mouth every 6 (six) hours as needed for mild pain.     aspirin 81 MG EC tablet  Take 1 tablet (81 mg total) by mouth daily.     atorvastatin 80 MG tablet  Commonly known as:  LIPITOR  Take 1 tablet (80 mg total) by mouth daily at 6 PM.     clopidogrel 75 MG tablet  Commonly known as:  PLAVIX  Take 1 tablet (75 mg total) by mouth daily.     furosemide 40 MG tablet  Commonly known as:  LASIX  Take 1 tablet (40 mg total) by mouth daily. For 5 days then stop.     lisinopril 20 MG tablet  Commonly known as:  PRINIVIL,ZESTRIL  Take 1  tablet (20 mg total) by mouth daily.     metoprolol 50 MG tablet  Commonly known as:  LOPRESSOR  Take 1 tablet (50 mg total) by mouth 2 (two) times daily.     oxyCODONE 5 MG immediate release tablet  Commonly known as:  Oxy IR/ROXICODONE  Take 1 tablet (5 mg total) by mouth every 4 (four) hours as needed for severe pain.       The patient has been discharged on:   1.Beta Blocker:  Yes [ x  ]                              No   [   ]                              If No, reason:  2.Ace Inhibitor/ARB: Yes [   x]                                     No  [    ]                                     If No, reason:  3.Statin:   Yes [x   ]                  No  [   ]                  If No, reason:  4.Ecasa:  Yes  [  x ]                  No   [   ]                  If No, reason:  Follow Up Appointments: Follow-up Information    Follow up with Donato Schultz, MD.    Specialty:  Cardiology   Why:  Call for an appointment for 2 weeks   Contact information:   [address] [place] [address] [phone]       Follow up with Delight Ovens, MD On 12/31/2015.   Specialty:  Cardiothoracic Surgery   Why:  PA/LAT CXR to be taken (at [facility] which is in the same building as Dr. Liam Graham office) on 12/31/2015 at 12:00 pm ;Appointment time is at 12:30 pm   Contact information:   [address] [employer] [address] [place] [address] [phone]       Signed: Doree Fudge MPA-C 12/01/2015, 7:47 AM

## 2015-11-30 NOTE — Progress Notes (Signed)
PATIENT ID: [id number] with hypertension admitted with non-ST elevation myocardial infarction and found to have three-vessel coronary artery disease on cath.  S/P CABG on 6/23 (LIMVA-->LAD, SVG-->D), SVG-->OM1-->LCx), SVG-->PDA)   SUBJECTIVE:  Justin Villa is feeling well.  He has ambulated without chest pain or shortness of breath. He does note some chest discomfort with movement and when laying on his side.  PHYSICAL EXAM Filed Vitals:   11/29/15 1702 11/29/15 1733 11/29/15 2004 11/30/15 0524  BP: 165/96 147/88 150/84 164/97  Pulse: 90 80 85 92  Temp:  99.3 F (37.4 C) 99.6 F (37.6 C) 99.6 F (37.6 C)  TempSrc:  Oral Oral Oral  Resp:  18 20 19   Height:      Weight:    237 lb 6.4 oz (107.684 kg)  SpO2:  92% 92% 94%   General:  Well-appearing.  No acute distres.  Neck: No jVD Lungs: Findings  Diminished at bilateral bases.  Heart:  RRR.  No m/r/g.  Normal S1/S2 Abdomen:  Soft, NT, ND.  +BS Extremities:  WWP.  1+ pitting edema bilaterally.  LABS: Lab Results  Component Value Date   TROPONINI 2.66* 11/26/2015   Results for orders placed or performed during the hospital encounter of 11/25/15 (from the past 24 hour(s))  CBC     Status: Abnormal   Collection Time: 11/30/15  2:14 AM  Result Value Ref Range   WBC 9.6 4.0 - 10.5 K/uL   RBC 4.05 (L) 4.22 - 5.81 MIL/uL   Hemoglobin 11.6 (L) 13.0 - 17.0 g/dL   HCT 16.133.8 (L) 09.639.0 - 04.552.0 %   MCV 83.5 78.0 - 100.0 fL   MCH 28.6 26.0 - 34.0 pg   MCHC 34.3 30.0 - 36.0 g/dL   RDW 40.913.9 81.111.5 - 91.415.5 %   Platelets 154 150 - 400 K/uL  Basic metabolic panel     Status: Abnormal   Collection Time: 11/30/15  2:14 AM  Result Value Ref Range   Sodium 133 (L) 135 - 145 mmol/L   Potassium 4.5 3.5 - 5.1 mmol/L   Chloride 102 101 - 111 mmol/L   CO2 26 22 - 32 mmol/L   Glucose, Bld 136 (H) 65 - 99 mg/dL   BUN 14 6 - 20 mg/dL   Creatinine, Ser 7.821.00 0.61 - 1.24 mg/dL   Calcium 8.2 (L) 8.9 - 10.3 mg/dL   GFR calc non Af Amer >60 >60 mL/min   GFR  calc Af Amer >60 >60 mL/min   Anion gap 5 5 - 15    Intake/Output Summary (Last 24 hours) at 11/30/15 0904 Last data filed at 11/29/15 1600  Gross per 24 hour  Intake    300 ml  Output      0 ml  Net    300 ml    Telemetry: No events  Echo 11/28/15: Study Conclusions  - Left ventricle: The cavity size was normal. There was mild  concentric hypertrophy. Systolic function was normal. The  estimated ejection fraction was in the range of 60% to 65%. Wall  motion was normal; there were no regional wall motion  abnormalities. Doppler parameters are consistent with abnormal  left ventricular relaxation (grade 1 diastolic dysfunction).  There was no evidence of elevated ventricular filling pressure by  Doppler parameters. - Aortic valve: Trileaflet; normal thickness leaflets. There was no  regurgitation. - Aortic root: The aortic root was normal in size. - Ascending aorta: The ascending aorta was normal in size. - Mitral valve: Structurally  normal valve. There was mild  regurgitation. - Left atrium: The atrium was normal in size. - Right ventricle: The cavity size was normal. Wall thickness was  normal. Systolic function was normal. - Right atrium: The atrium was normal in size. - Tricuspid valve: There was no regurgitation. - Pulmonary arteries: Systolic pressure was within the normal  range. - Inferior vena cava: The vessel was normal in size. - Pericardium, extracardiac: There was no pericardial effusion.  LHC 11/26/15: 1. Mid RCA lesion, 85% stenosed. 2. 1st Mrg lesion, 85% stenosed. 3. Ost Cx to Mid Cx lesion, 95% stenosed. 4. Ost 2nd Mrg to 2nd Mrg lesion, 90% stenosed. 5. Mid Cx to Dist Cx lesion, 65% stenosed. 6. Ost 1st Diag to 1st Diag lesion, 70% stenosed. 7. Ost 2nd Diag to 2nd Diag lesion, 95% stenosed. 8. Mid LAD to Dist LAD lesion, 100% stenosed. 9. Ost LAD to Mid LAD lesion, 50% stenosed. 10. Dist RCA lesion, 75% stenosed. 11. RPDA-1 lesion, 40%  stenosed. 12. RPDA-2 lesion, 50% stenosed. 13. 2nd RPLB lesion, 50% stenosed.   ASSESSMENT AND PLAN:  Active Problems:   ACS (acute coronary syndrome) (HCC)   CAD (coronary artery disease)   # 3 Vessel CAD s/p NSTEMI and CABG: Justin Villa is doing well post-operatively.  He has typical chest wall discomfort.  Echo showed normal systolic function and he has grade 1 diastolic dysfunction.  He is mildly volume overloaded post operatively.  Will give one dose of lasix 40 mg po today.  Continue aspirin and start Plavix or ticagrelor (x1 years) for NSTEMI when stable per CT surgery.    # Hypertensive heart disease:  Blood pressure remains poorly-controlled despite starting lisinopril and metoprolol. We will increase lisinopril to 10 mg today.   # Hyperlipidemia:  Atorvastatin 80 mg started this admission.  LDL 96. He will need fasting lipids and LFTs in 6 weeks.     Jadd Gasior C. Duke Salviaandolph, MD, [facility] 11/30/2015 9:04 AM

## 2015-12-01 MED ORDER — METOPROLOL TARTRATE 25 MG PO TABS: 37.5000 mg | ORAL_TABLET | Freq: Two times a day (BID) | ORAL | Status: DC

## 2015-12-01 MED ORDER — ASPIRIN EC 81 MG PO TBEC
81.0000 mg | DELAYED_RELEASE_TABLET | Freq: Every day | ORAL | Status: DC
  Administered 2015-12-01: 81 mg via ORAL
  Filled 2015-12-01: qty 1

## 2015-12-01 MED ORDER — ASPIRIN 81 MG PO TBEC: 81.0000 mg | DELAYED_RELEASE_TABLET | Freq: Every day | ORAL | Status: AC

## 2015-12-01 MED ORDER — ATORVASTATIN CALCIUM 80 MG PO TABS: 80.0000 mg | ORAL_TABLET | Freq: Every day | ORAL | Status: DC

## 2015-12-01 MED ORDER — METOPROLOL TARTRATE 50 MG PO TABS
50.0000 mg | ORAL_TABLET | Freq: Two times a day (BID) | ORAL | Status: DC
  Administered 2015-12-01: 50 mg via ORAL
  Filled 2015-12-01: qty 1

## 2015-12-01 MED ORDER — OXYCODONE HCL 5 MG PO TABS: 5.0000 mg | ORAL_TABLET | ORAL | Status: DC | PRN

## 2015-12-01 MED ORDER — FUROSEMIDE 40 MG PO TABS: 40.0000 mg | ORAL_TABLET | Freq: Every day | ORAL | Status: DC

## 2015-12-01 MED ORDER — LISINOPRIL 20 MG PO TABS: 20.0000 mg | ORAL_TABLET | Freq: Every day | ORAL | Status: DC

## 2015-12-01 MED ORDER — CLOPIDOGREL BISULFATE 75 MG PO TABS: 75.0000 mg | ORAL_TABLET | Freq: Every day | ORAL | Status: DC

## 2015-12-01 MED ORDER — POTASSIUM CHLORIDE CRYS ER 20 MEQ PO TBCR
20.0000 meq | EXTENDED_RELEASE_TABLET | Freq: Once | ORAL | Status: AC
  Administered 2015-12-01: 20 meq via ORAL
  Filled 2015-12-01: qty 1

## 2015-12-01 MED ORDER — METOPROLOL TARTRATE 50 MG PO TABS: 50.0000 mg | ORAL_TABLET | Freq: Two times a day (BID) | ORAL | Status: DC

## 2015-12-01 MED ORDER — LISINOPRIL 40 MG PO TABS: 40.0000 mg | ORAL_TABLET | Freq: Every day | ORAL | Status: DC

## 2015-12-01 MED ORDER — ACETAMINOPHEN 325 MG PO TABS: 650.0000 mg | ORAL_TABLET | Freq: Four times a day (QID) | ORAL | Status: DC | PRN

## 2015-12-01 NOTE — Care Management Note (Signed)
Case Management Note Donn PieriniKristi Loy Little RN, BSN Unit 2W-Case Manager [phone]  Patient Details  Name: Justin ArntDavid L Villa MRN: [id number] Date of Birth: 05/08/1966  Subjective/Objective:  Pt admitted with ACS- s/p CABG x5                  Action/Plan: PTA pt lived at home - anticipate return home  Expected Discharge Date:   12/01/15               Expected Discharge Plan:  Home/Self Care  In-House Referral:     Discharge planning Services  CM Consult  Post Acute Care Choice:    Choice offered to:     DME Arranged:    DME Agency:     HH Arranged:    HH Agency:     Status of Service:  Completed, signed off  If discussed at [employer] Length of Stay Meetings, dates discussed:    Additional Comments:  12/01/15- 1200- Donn PieriniKristi Augustina Braddock RN ,BSN- pt for d/c home today- no CM needs noted  Darrold SpanWebster, Derwood Becraft Hall, RN 12/01/2015, 12:16 PM

## 2015-12-01 NOTE — Progress Notes (Signed)
Patient's chest tube sutures were removed without incident.  After care was explained and patient voiced understanding.

## 2015-12-01 NOTE — Progress Notes (Signed)
Order received to discharge patient.  Patient expresses readiness to discharge.  Telemetry monitor removed and CCMD notified.  Discharge instructions, follow up, medications and instructions for their use were discussed with patient and he voiced understanding.  Patient in no apparent distress at time of discharge.

## 2015-12-01 NOTE — Progress Notes (Signed)
CARDIAC REHAB PHASE I   PRE:  Rate/Rhythm: 92 SR    BP: sitting 150/100    SaO2:   MODE:  Ambulation: 790 ft   POST:  Rate/Rhythm: 103 ST    BP: sitting 154/94     SaO2:   Tolerated well with lower HR today, less SOB. BP still elevated. Ed completed with good reception. [facility].  [phone]   Harriet MassonRandi Kristan Cherre Kothari CES, ACSM 12/01/2015 10:25 AM

## 2015-12-03 ENCOUNTER — Encounter: Payer: Self-pay | Admitting: Physician Assistant

## 2015-12-10 ENCOUNTER — Ambulatory Visit (INDEPENDENT_AMBULATORY_CARE_PROVIDER_SITE_OTHER): Payer: PRIVATE HEALTH INSURANCE | Admitting: Physician Assistant

## 2015-12-10 ENCOUNTER — Encounter (INDEPENDENT_AMBULATORY_CARE_PROVIDER_SITE_OTHER): Payer: Self-pay

## 2015-12-10 ENCOUNTER — Encounter: Payer: Self-pay | Admitting: Physician Assistant

## 2015-12-10 VITALS — BP 90/60 | HR 84 | Ht 72.0 in | Wt 217.8 lb

## 2015-12-10 DIAGNOSIS — I2510 Atherosclerotic heart disease of native coronary artery without angina pectoris: Secondary | ICD-10-CM

## 2015-12-10 DIAGNOSIS — E785 Hyperlipidemia, unspecified: Secondary | ICD-10-CM | POA: Diagnosis not present

## 2015-12-10 DIAGNOSIS — I214 Non-ST elevation (NSTEMI) myocardial infarction: Secondary | ICD-10-CM | POA: Diagnosis not present

## 2015-12-10 DIAGNOSIS — I10 Essential (primary) hypertension: Secondary | ICD-10-CM | POA: Diagnosis not present

## 2015-12-10 DIAGNOSIS — I6529 Occlusion and stenosis of unspecified carotid artery: Secondary | ICD-10-CM | POA: Insufficient documentation

## 2015-12-10 LAB — BASIC METABOLIC PANEL
BUN: 26 mg/dL — AB (ref 7–25)
CHLORIDE: 103 mmol/L (ref 98–110)
CO2: 20 mmol/L (ref 20–31)
Calcium: 8.9 mg/dL (ref 8.6–10.3)
Creat: 1.36 mg/dL — ABNORMAL HIGH (ref 0.70–1.33)
Glucose, Bld: 119 mg/dL — ABNORMAL HIGH (ref 65–99)
POTASSIUM: 5.4 mmol/L — AB (ref 3.5–5.3)
SODIUM: 136 mmol/L (ref 135–146)

## 2015-12-10 MED ORDER — LISINOPRIL 40 MG PO TABS: 20.0000 mg | ORAL_TABLET | Freq: Every day | ORAL | Status: DC

## 2015-12-10 NOTE — Patient Instructions (Addendum)
Medication Instructions:  Decrease Lisinopril 40 mg - take 1/2 tablet (20 mg ) Once daily  Labwork: Today - BMET  In 6 weeks - Lipids and LFTs Testing/Procedures: None  Follow-Up: [facility] with Dr. Nona DellSamuel McDowell, Dr. Prentice DockerSuresh Koneswaran or Dr. Dina RichJonathan Branch in 2 months. Any Other Special Instructions Will Be Listed Below (If Applicable). If you need a refill on your cardiac medications before your next appointment, please call your pharmacy.

## 2015-12-10 NOTE — Progress Notes (Signed)
Cardiology Office Note:    Date:  12/10/2015   ID:  Justin Villa, DOB 1965-12-01, MRN [id number]  PCP:  No PCP Per Patient  Cardiologist:  Dr. Donato Schultz   Electrophysiologist:  n/a  Referring MD: No ref. provider found   Chief Complaint  Patient presents with  . Hospitalization Follow-up    s/p CABG    History of Present Illness:     Justin Villa is a 50 y.o. male with a hx of HTN.  Admitted 6/21-6/27 with NSTEMI.  LHC demonstrated 3 v CAD.  He underwent CABG with Dr. Tyrone Sage on 11/27/15 (L-LAD, S-Dx, S-OM1/LCx, S-PDA).  Echocardiogram demonstrated normal LV function. He was discharged on a combination of aspirin, Plavix, statin, ACE inhibitor, beta-blocker. He remained in normal sinus rhythm. He returns for follow-up. Here alone today. Overall, he is doing well. Chest remains somewhat sore. Denies significant dyspnea. Denies orthopnea, PND or edema. Appetite is good. Denies fevers or chills. Denies syncope.   Past Medical History  Diagnosis Date  . Hypertension   . CAD (coronary artery disease) 11/27/2015    a. S/p NSTEMI 6/17 >> s/p CABG // b. LHC 6/17: oLAD 50, mLAD 100, D1 70, D2 95, oLCx 95, mLCx 65, OM1 85, OM2 90, mRCA 85, dRCA 75, RPDA 40/50, RPLB2 50   . History of echocardiogram     a. Echo 6/17: mild concentric LVH, EF 60-65%, normal wall motion, grade 1 diastolic dysfunction, mild MR  . History of non-ST elevation myocardial infarction (NSTEMI) 11/2015  . HLD (hyperlipidemia)   . Carotid stenosis     a. Carotid US 6/17: bilat ICA 1-39%    Past Surgical History  Procedure Laterality Date  . Back surgery    . Knee surgery    . Cardiac catheterization N/A 11/26/2015    Procedure: Left Heart Cath and Coronary Angiography;  Surgeon: Lyn Records, MD;  Location: [facility] INVASIVE CV LAB;  Service: Cardiovascular;  Laterality: N/A;  . Coronary artery bypass graft N/A 11/27/2015    Procedure: CORONARY ARTERY BYPASS GRAFTING (CABG) x 5 (LIMA to LAD, SVG to DIAGONAL, SVG  SEQUENTIALLY to OM1 and OM2, SVG to PDA) with EVH from right leg using greater saphenous vein and mammary.;  Surgeon: Delight Ovens, MD;  Location: [facility] OR;  Service: Open Heart Surgery;  Laterality: N/A;  . Intraoperative transesophageal echocardiogram N/A 11/27/2015    Procedure: INTRAOPERATIVE TRANSESOPHAGEAL ECHOCARDIOGRAM;  Surgeon: Delight Ovens, MD;  Location: [facility] OR;  Service: Open Heart Surgery;  Laterality: N/A;    Current Medications: Outpatient Prescriptions Prior to Visit  Medication Sig Dispense Refill  . aspirin EC 81 MG EC tablet Take 1 tablet (81 mg total) by mouth daily.    Marland Kitchen atorvastatin (LIPITOR) 80 MG tablet Take 1 tablet (80 mg total) by mouth daily at 6 PM. 30 tablet 1  . clopidogrel (PLAVIX) 75 MG tablet Take 1 tablet (75 mg total) by mouth daily. 30 tablet 1  . metoprolol (LOPRESSOR) 50 MG tablet Take 1 tablet (50 mg total) by mouth 2 (two) times daily. 60 tablet 1  . lisinopril (PRINIVIL,ZESTRIL) 40 MG tablet Take 1 tablet (40 mg total) by mouth daily. 30 tablet 1  . acetaminophen (TYLENOL) 325 MG tablet Take 2 tablets (650 mg total) by mouth every 6 (six) hours as needed for mild pain. (Patient not taking: Reported on 12/10/2015)    . furosemide (LASIX) 40 MG tablet Take 1 tablet (40 mg total) by mouth daily. For 5  days then stop. (Patient not taking: Reported on 12/10/2015) 5 tablet 0  . oxyCODONE (OXY IR/ROXICODONE) 5 MG immediate release tablet Take 1 tablet (5 mg total) by mouth every 4 (four) hours as needed for severe pain. (Patient not taking: Reported on 12/10/2015) 30 tablet 0   No facility-administered medications prior to visit.      Allergies:   Review of patient's allergies indicates no known allergies.   Social History   Social History  . Marital Status: Married    Spouse Name: N/A  . Number of Children: N/A  . Years of Education: N/A   Social History Main Topics  . Smoking status: Never Smoker   . Smokeless tobacco: None  . Alcohol Use: No  .  Drug Use: No  . Sexual Activity: Not Asked   Other Topics Concern  . None   Social History Narrative     Family History:  The patient's family history includes Heart disease in his father.   ROS:   Please see the history of present illness.    ROS All other systems reviewed and are negative.   Physical Exam:    VS:  BP 90/60 mmHg  Pulse 84  Ht 6' (1.829 m)  Wt 217 lb 12.8 oz (98.793 kg)  BMI 29.53 kg/m2   Physical Exam  Constitutional: He is oriented to person, place, and time. He appears well-developed and well-nourished.  HENT:  Head: Normocephalic and atraumatic.  Neck: No JVD present.  Cardiovascular: Normal rate, regular rhythm and normal heart sounds.   No murmur heard. Median sternotomy well healed without erythema or discharge  Pulmonary/Chest: Effort normal and breath sounds normal. He has no wheezes. He has no rales.  Abdominal: Soft. There is no tenderness.  Musculoskeletal: He exhibits no edema.  Neurological: He is alert and oriented to person, place, and time.  Skin: Skin is warm and dry.  Psychiatric: He has a normal mood and affect.    Wt Readings from Last 3 Encounters:  12/10/15 217 lb 12.8 oz (98.793 kg)  12/01/15 229 lb 8 oz (104.101 kg)  01/03/15 240 lb (108.863 kg)     Studies/Labs Reviewed:     EKG:  EKG is  ordered today.  The ekg ordered today demonstrates NSR, HR 84, leftward axis, nonspecific ST-T wave changes, QTc 432 ms  Recent Labs: 11/26/2015: ALT 46; B Natriuretic Peptide 28.0 11/28/2015: Magnesium 2.5* 11/30/2015: BUN 14; Creatinine, Ser 1.00; Hemoglobin 11.6*; Platelets 154; Potassium 4.5; Sodium 133*   Recent Lipid Panel    Component Value Date/Time   CHOL 158 11/26/2015 0436   TRIG 135 11/26/2015 0436   HDL 35* 11/26/2015 0436   CHOLHDL 4.5 11/26/2015 0436   VLDL 27 11/26/2015 0436   LDLCALC 96 11/26/2015 0436    Additional studies/ records that were reviewed today include:   Echo 11/28/15 - Left ventricle: The cavity  size was normal. There was mild   concentric hypertrophy. Systolic function was normal. The   estimated ejection fraction was in the range of 60% to 65%. Wall   motion was normal; there were no regional wall motion   abnormalities. Doppler parameters are consistent with abnormal   left ventricular relaxation (grade 1 diastolic dysfunction).   There was no evidence of elevated ventricular filling pressure by   Doppler parameters. - Aortic valve: Trileaflet; normal thickness leaflets. There was no   regurgitation. - Aortic root: The aortic root was normal in size. - Ascending aorta: The ascending aorta was normal  in size. - Mitral valve: Structurally normal valve. There was mild   regurgitation. - Left atrium: The atrium was normal in size. - Right ventricle: The cavity size was normal. Wall thickness was   normal. Systolic function was normal. - Right atrium: The atrium was normal in size. - Tricuspid valve: There was no regurgitation. - Pulmonary arteries: Systolic pressure was within the normal   range. - Inferior vena cava: The vessel was normal in size. - Pericardium, extracardiac: There was no pericardial effusion.   Carotid US 11/26/15 Summary: Bilateral: mild soft plaque CCA and origin ICA. 1-39% ICA plaquing  LHC 11/26/15 LAD ost 50%, mid 100%, D1 70%, D2 95% LCx ost 95%, mid 65%, OM1 85%, OM2 90% RCA mid 85%, dist 75%, RPDA 40%, 50%; RPLB2 50% EF 55-65%  ASSESSMENT:     1. NSTEMI (non-ST elevated myocardial infarction) (HCC)   2. Coronary artery disease involving native coronary artery of native heart without angina pectoris   3. Essential hypertension   4. HLD (hyperlipidemia)    PLAN:     In order of problems listed above:  1. S/p NSTEMI - He is status post CABG. LV function was preserved. He will need to remain on dual and epidural therapy for a minimum of 12 months. Continue high-dose statin, beta blocker, ACE inhibitor. He sees Dr. Tyrone SageGerhardt later this month. He  knows to contact us if he does not hear back from cardiac rehabilitation.  2. CAD - Continue aspirin, statin, beta blocker, ACE inhibitor, Plavix.  3. HTN - Blood pressure actually running low. Decrease lisinopril to 20 mg daily. Obtain follow-up BMET today.  4. HL - Continue high-dose statin. Obtain follow-up lipids and LFTs in 6 weeks.  Disposition: Patient prefers to follow-up in the [place] [facility] as this is closer to his home. He can follow up there in 2 months.   Medication Adjustments/Labs and Tests Ordered: Current medicines are reviewed at length with the patient today.  Concerns regarding medicines are outlined above.  Medication changes, Labs and Tests ordered today are outlined in the Patient Instructions noted below. Patient Instructions  Medication Instructions:  Decrease Lisinopril 40 mg - take 1/2 tablet (20 mg ) Once daily  Labwork: Today - BMET  In 6 weeks - Lipids and LFTs Testing/Procedures: None  Follow-Up: [facility] with Dr. Nona DellSamuel McDowell, Dr. Prentice DockerSuresh Koneswaran or Dr. Dina RichJonathan Branch in 2 months. Any Other Special Instructions Will Be Listed Below (If Applicable). If you need a refill on your cardiac medications before your next appointment, please call your pharmacy.    Signed, Tereso NewcomerScott Glenmore Karl, PA-C  12/10/2015 3:46 PM    [facility] [address], [place], [place]  [address] Phone: [phone]([phone]; Fax: [phone]) [phone]

## 2015-12-11 ENCOUNTER — Telehealth: Payer: Self-pay | Admitting: *Deleted

## 2015-12-11 DIAGNOSIS — I10 Essential (primary) hypertension: Secondary | ICD-10-CM

## 2015-12-11 NOTE — Telephone Encounter (Signed)
Lmtcb to go over results and recommendations.  

## 2015-12-11 NOTE — Telephone Encounter (Signed)
Pt notified of lab results and findings by phone with verbal understanding. Pt agreeable to hold lisinopril x 3 days then resume lisinopril 20 mg daily. BMET 7/13.

## 2015-12-17 ENCOUNTER — Other Ambulatory Visit (INDEPENDENT_AMBULATORY_CARE_PROVIDER_SITE_OTHER): Payer: PRIVATE HEALTH INSURANCE | Admitting: *Deleted

## 2015-12-17 DIAGNOSIS — I10 Essential (primary) hypertension: Secondary | ICD-10-CM | POA: Diagnosis not present

## 2015-12-17 LAB — BASIC METABOLIC PANEL
BUN: 25 mg/dL (ref 7–25)
CHLORIDE: 104 mmol/L (ref 98–110)
CO2: 24 mmol/L (ref 20–31)
CREATININE: 1.09 mg/dL (ref 0.70–1.33)
Calcium: 8.9 mg/dL (ref 8.6–10.3)
Glucose, Bld: 83 mg/dL (ref 65–99)
Potassium: 4.9 mmol/L (ref 3.5–5.3)
Sodium: 138 mmol/L (ref 135–146)

## 2015-12-18 ENCOUNTER — Telehealth: Payer: Self-pay | Admitting: *Deleted

## 2015-12-18 NOTE — Telephone Encounter (Signed)
Pt notified of lab results by phone with verbal understanding. I asked pt how was his BP doing. Pt was driving and he said his BP is doing better though he had not taken it yet today.

## 2015-12-25 ENCOUNTER — Other Ambulatory Visit: Payer: Self-pay | Admitting: Cardiothoracic Surgery

## 2015-12-25 DIAGNOSIS — Z951 Presence of aortocoronary bypass graft: Secondary | ICD-10-CM

## 2015-12-28 ENCOUNTER — Ambulatory Visit
Admission: RE | Admit: 2015-12-28 | Discharge: 2015-12-28 | Disposition: A | Discharge status: Home / Self Care | Payer: PRIVATE HEALTH INSURANCE | Source: Ambulatory Visit | Attending: Cardiothoracic Surgery | Admitting: Cardiothoracic Surgery

## 2015-12-28 ENCOUNTER — Ambulatory Visit (INDEPENDENT_AMBULATORY_CARE_PROVIDER_SITE_OTHER): Payer: Self-pay | Admitting: Physician Assistant

## 2015-12-28 VITALS — BP 111/78 | HR 68 | Resp 16 | Ht 72.0 in | Wt 212.0 lb

## 2015-12-28 DIAGNOSIS — I2510 Atherosclerotic heart disease of native coronary artery without angina pectoris: Secondary | ICD-10-CM

## 2015-12-28 DIAGNOSIS — Z951 Presence of aortocoronary bypass graft: Secondary | ICD-10-CM

## 2015-12-28 NOTE — Patient Instructions (Signed)
Coronary Artery Bypass Grafting, Care After °These instructions give you information on caring for yourself after your procedure. Your doctor may also give you more specific instructions. Call your doctor if you have any problems or questions after your procedure.  °HOME CARE °· Only take medicine as told by your doctor. Take medicines exactly as told. Do not stop taking medicines or start any new medicines without talking to your doctor first. °· Take your pulse as told by your doctor. °· Do deep breathing as told by your doctor. Use your breathing device (incentive spirometer), if given, to practice deep breathing several times a day. Support your chest with a pillow or your arms when you take deep breaths or cough. °· Keep the area clean, dry, and protected where the surgery cuts (incisions) were made. Remove bandages (dressings) only as told by your doctor. If strips were applied to surgical area, do not take them off. They fall off on their own. °· Check the surgery area daily for puffiness (swelling), redness, or leaking fluid. °· If surgery cuts were made in your legs: °¨ Avoid crossing your legs. °¨ Avoid sitting for long periods of time. Change positions every 30 minutes. °¨ Raise your legs when you are sitting. Place them on pillows. °· Wear stockings that help keep blood clots from forming in your legs (compression stockings). °· Only take sponge baths until your doctor says it is okay to take showers. Pat the surgery area dry. Do not rub the surgery area with a washcloth or towel. Do not bathe, swim, or use a hot tub until your doctor says it is okay. °· Eat foods that are high in fiber. These include raw fruits and vegetables, whole grains, beans, and nuts. Choose lean meats. Avoid canned, processed, and fried foods. °· Drink enough fluids to keep your pee (urine) clear or pale yellow. °· Weigh yourself every day. °· Rest and limit activity as told by your doctor. You may be told to: °¨ Stop any  activity if you have chest pain, shortness of breath, changes in heartbeat, or dizziness. Get help right away if this happens. °¨ Move around often for short amounts of time or take short walks as told by your doctor. Gradually become more active. You may need help to strengthen your muscles and build endurance. °¨ Avoid lifting, pushing, or pulling anything heavier than 10 pounds (4.5 kg) for at least 6 weeks after surgery. °· Do not drive until your doctor says it is okay. °· Ask your doctor when you can go back to work. °· Ask your doctor when you can begin sexual activity again. °· Follow up with your doctor as told. °GET HELP IF: °· You have puffiness, redness, more pain, or fluid draining from the incision site. °· You have a fever. °· You have puffiness in your ankles or legs. °· You have pain in your legs. °· You gain 2 or more pounds (0.9 kg) a day. °· You feel sick to your stomach (nauseous) or throw up (vomit). °· You have watery poop (diarrhea). °GET HELP RIGHT AWAY IF: °· You have chest pain that goes to your jaw or arms. °· You have shortness of breath. °· You have a fast or irregular heartbeat. °· You notice a "clicking" in your breastbone when you move. °· You have numbness or weakness in your arms or legs. °· You feel dizzy or light-headed. °MAKE SURE YOU: °· Understand these instructions. °· Will watch your condition. °· Will get   help right away if you are not doing well or get worse. °  °This information is not intended to replace advice given to you by your health care provider. Make sure you discuss any questions you have with your health care provider. °  °Document Released: 05/28/2013 Document Reviewed: 05/28/2013 °Elsevier Interactive Patient Education ©2016 Elsevier Inc. ° °
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
## 2015-12-31 ENCOUNTER — Ambulatory Visit: Payer: PRIVATE HEALTH INSURANCE | Admitting: Cardiothoracic Surgery

## 2016-01-06 ENCOUNTER — Encounter (HOSPITAL_COMMUNITY): Payer: Self-pay | Admitting: *Deleted

## 2016-02-09 ENCOUNTER — Encounter: Payer: Self-pay | Admitting: Cardiology

## 2016-02-09 ENCOUNTER — Other Ambulatory Visit: Payer: Self-pay | Admitting: Physician Assistant

## 2016-02-09 NOTE — Progress Notes (Deleted)
Cardiology Office Note  Date: 02/09/2016   ID: Justin Villa, DOB May 29, 1966, MRN [id number]  PCP: No PCP Per Patient  Primary Cardiologist: Justin Dell, MD   No chief complaint on file.   History of Present Illness: Justin Villa is a 50 y.o. male seen recently by Mr. Alben Spittle PA-C-in July following bypass surgery in June. He was scheduled to follow-up going forward in the [place] office, this is our first meeting today. I reviewed his records.  Past Medical History:  Diagnosis Date  . CAD (coronary artery disease) 11/27/2015   a. S/p NSTEMI 6/17 >> s/p CABG // b. LHC 6/17: oLAD 50, mLAD 100, D1 70, D2 95, oLCx 95, mLCx 65, OM1 85, OM2 90, mRCA 85, dRCA 75, RPDA 40/50, RPLB2 50   . Carotid stenosis    a. Carotid US 6/17: bilat ICA 1-39%  . Essential hypertension   . History of non-ST elevation myocardial infarction (NSTEMI) 11/2015  . Hyperlipidemia     Past Surgical History:  Procedure Laterality Date  . BACK SURGERY    . CARDIAC CATHETERIZATION N/A 11/26/2015   Procedure: Left Heart Cath and Coronary Angiography;  Surgeon: Lyn Records, MD;  Location: [facility] INVASIVE CV LAB;  Service: Cardiovascular;  Laterality: N/A;  . CORONARY ARTERY BYPASS GRAFT N/A 11/27/2015   Procedure: CORONARY ARTERY BYPASS GRAFTING (CABG) x 5 (LIMA to LAD, SVG to DIAGONAL, SVG SEQUENTIALLY to OM1 and OM2, SVG to PDA) with EVH from right leg using greater saphenous vein and mammary.;  Surgeon: Delight Ovens, MD;  Location: [facility] OR;  Service: Open Heart Surgery;  Laterality: N/A;  . INTRAOPERATIVE TRANSESOPHAGEAL ECHOCARDIOGRAM N/A 11/27/2015   Procedure: INTRAOPERATIVE TRANSESOPHAGEAL ECHOCARDIOGRAM;  Surgeon: Delight Ovens, MD;  Location: [facility] OR;  Service: Open Heart Surgery;  Laterality: N/A;  . KNEE SURGERY      Current Outpatient Prescriptions  Medication Sig Dispense Refill  . aspirin EC 81 MG EC tablet Take 1 tablet (81 mg total) by mouth daily.    Marland Kitchen atorvastatin (LIPITOR) 80 MG  tablet Take 1 tablet (80 mg total) by mouth daily at 6 PM. 30 tablet 1  . clopidogrel (PLAVIX) 75 MG tablet Take 1 tablet (75 mg total) by mouth daily. 30 tablet 1  . lisinopril (PRINIVIL,ZESTRIL) 40 MG tablet Take 0.5 tablets (20 mg total) by mouth daily. 30 tablet 1  . metoprolol (LOPRESSOR) 50 MG tablet Take 1 tablet (50 mg total) by mouth 2 (two) times daily. 60 tablet 1   No current facility-administered medications for this visit.    Allergies:  Review of patient's allergies indicates no known allergies.   Social History: The patient  reports that he has never smoked. He does not have any smokeless tobacco history on file. He reports that he does not drink alcohol or use drugs.   Family History: The patient's family history includes Heart disease in his father.   ROS:  Please see the history of present illness. Otherwise, complete review of systems is positive for {NONE DEFAULTED:18576::"none"}.  All other systems are reviewed and negative.   Physical Exam: VS:  There were no vitals taken for this visit., BMI There is no height or weight on file to calculate BMI.  Wt Readings from Last 3 Encounters:  12/28/15 212 lb (96.2 kg)  12/10/15 217 lb 12.8 oz (98.8 kg)  12/01/15 229 lb 8 oz (104.1 kg)    General: Patient appears comfortable at rest. HEENT: Conjunctiva and lids normal, oropharynx clear  with moist mucosa. Neck: Supple, no elevated JVP or carotid bruits, no thyromegaly. Lungs: Clear to auscultation, nonlabored breathing at rest. Cardiac: Regular rate and rhythm, no S3 or significant systolic murmur, no pericardial rub. Abdomen: Soft, nontender, no hepatomegaly, bowel sounds present, no guarding or rebound. Extremities: No pitting edema, distal pulses 2+. Skin: Warm and dry. Musculoskeletal: No kyphosis. Neuropsychiatric: Alert and oriented x3, affect grossly appropriate.  ECG: I personally reviewed the tracing from 12/10/2015 which showed normal sinus rhythm with  nonspecific T-wave abnormalities.  Recent Labwork: 11/26/2015: ALT 46; AST 28; B Natriuretic Peptide 28.0 11/28/2015: Magnesium 2.5 11/30/2015: Hemoglobin 11.6; Platelets 154 12/17/2015: BUN 25; Creat 1.09; Potassium 4.9; Sodium 138     Component Value Date/Time   CHOL 158 11/26/2015 0436   TRIG 135 11/26/2015 0436   HDL 35 (L) 11/26/2015 0436   CHOLHDL 4.5 11/26/2015 0436   VLDL 27 11/26/2015 0436   LDLCALC 96 11/26/2015 0436    Other Studies Reviewed Today:  Echocardiogram 11/28/2015: Study Conclusions  - Left ventricle: The cavity size was normal. There was mild   concentric hypertrophy. Systolic function was normal. The   estimated ejection fraction was in the range of 60% to 65%. Wall   motion was normal; there were no regional wall motion   abnormalities. Doppler parameters are consistent with abnormal   left ventricular relaxation (grade 1 diastolic dysfunction).   There was no evidence of elevated ventricular filling pressure by   Doppler parameters. - Aortic valve: Trileaflet; normal thickness leaflets. There was no   regurgitation. - Aortic root: The aortic root was normal in size. - Ascending aorta: The ascending aorta was normal in size. - Mitral valve: Structurally normal valve. There was mild   regurgitation. - Left atrium: The atrium was normal in size. - Right ventricle: The cavity size was normal. Wall thickness was   normal. Systolic function was normal. - Right atrium: The atrium was normal in size. - Tricuspid valve: There was no regurgitation. - Pulmonary arteries: Systolic pressure was within the normal   range. - Inferior vena cava: The vessel was normal in size. - Pericardium, extracardiac: There was no pericardial effusion.  Cardiac catheterization 11/26/2015: 1. Mid RCA lesion, 85% stenosed. 2. 1st Mrg lesion, 85% stenosed. 3. Ost Cx to Mid Cx lesion, 95% stenosed. 4. Ost 2nd Mrg to 2nd Mrg lesion, 90% stenosed. 5. Mid Cx to Dist Cx lesion, 65%  stenosed. 6. Ost 1st Diag to 1st Diag lesion, 70% stenosed. 7. Ost 2nd Diag to 2nd Diag lesion, 95% stenosed. 8. Mid LAD to Dist LAD lesion, 100% stenosed. 9. Ost LAD to Mid LAD lesion, 50% stenosed. 10. Dist RCA lesion, 75% stenosed. 11. RPDA-1 lesion, 40% stenosed. 12. RPDA-2 lesion, 50% stenosed. 13. 2nd RPLB lesion, 50% stenosed.    Severe three-vessel coronary disease as outlined above with totally occluded mid LAD high-grade obstruction in large diagonal branches, high-grade obstruction in the proximal circumflex in each obtuse marginal branch, and high-grade obstruction in the mid and distal RCA. The RCA supplies the LAD via septal perforator collaterals.  Normal left ventricular systolic function. Normal left ventricular filling pressures.  Assessment and Plan:   Current medicines were reviewed with the patient today.  No orders of the defined types were placed in this encounter.   Disposition:  Signed, Jonelle SidleSamuel G. Chaz Mcglasson, MD, [facility] 02/09/2016 12:32 PM    [facility] [address], [place], [place] [address] Phone: [phone]; Fax: [phone]

## 2016-02-10 ENCOUNTER — Encounter: Payer: Self-pay | Admitting: Cardiology

## 2016-02-10 ENCOUNTER — Ambulatory Visit: Payer: PRIVATE HEALTH INSURANCE | Admitting: Cardiology

## 2016-02-23 ENCOUNTER — Other Ambulatory Visit: Payer: Self-pay | Admitting: Physician Assistant

## 2016-03-31 NOTE — Progress Notes (Signed)
Cardiology Office Note  Date: 04/01/2016   ID: Justin Villa, DOB [birth year], MRN [id number]  PCP: Fredirick Maudlin, MD  Evaluating Cardiologist: Nona Dell, MD   Chief Complaint  Patient presents with  . Coronary Artery Disease    History of Present Illness: Justin Villa is a 50 y.o. male that I am meeting in clinic for the first time today. I reviewed his records - he was admitted to the hospital in June with an NSTEMI, subsequent cardiac catheterization demonstrating multivessel CAD and ultimately status post CABG with Dr. Tyrone Sage including LIMA to the LAD, SVG to the diagonal, SVG to the circumflex and first obtuse marginal, and SVG to the PDA. He was seen in the office for follow-up by [name] in July and has also had follow-up with [facility].  He presents today for a routine visit. States that he has recently run out of his medications, could not get a refill from the pharmacy without being seen. He is doing very well, exercises at least 4 days a week at the gym, mainly using the treadmill but some weights as well. He is not reporting any angina and has NYHA class I dyspnea.  He is self-employed, owns some car lots in town, also sells motorcycle parts.  We went over his medications and discussed adjustments.  Past Medical History:  Diagnosis Date  . CAD (coronary artery disease) 11/27/2015   a. S/p NSTEMI 6/17 >> s/p CABG // b. LHC 6/17: oLAD 50, mLAD 100, D1 70, D2 95, oLCx 95, mLCx 65, OM1 85, OM2 90, mRCA 85, dRCA 75, RPDA 40/50, RPLB2 50   . Carotid stenosis    a. Carotid US 6/17: bilat ICA 1-39%  . Essential hypertension   . History of non-ST elevation myocardial infarction (NSTEMI) 11/2015  . Hyperlipidemia     Past Surgical History:  Procedure Laterality Date  . BACK SURGERY    . CARDIAC CATHETERIZATION N/A 11/26/2015   Procedure: Left Heart Cath and Coronary Angiography;  Surgeon: Lyn Records, MD;  Location: [facility] INVASIVE CV LAB;  Service:  Cardiovascular;  Laterality: N/A;  . CORONARY ARTERY BYPASS GRAFT N/A 11/27/2015   Procedure: CORONARY ARTERY BYPASS GRAFTING (CABG) x 5 (LIMA to LAD, SVG to DIAGONAL, SVG SEQUENTIALLY to OM1 and OM2, SVG to PDA) with EVH from right leg using greater saphenous vein and mammary.;  Surgeon: Delight Ovens, MD;  Location: [facility] OR;  Service: Open Heart Surgery;  Laterality: N/A;  . INTRAOPERATIVE TRANSESOPHAGEAL ECHOCARDIOGRAM N/A 11/27/2015   Procedure: INTRAOPERATIVE TRANSESOPHAGEAL ECHOCARDIOGRAM;  Surgeon: Delight Ovens, MD;  Location: [facility] OR;  Service: Open Heart Surgery;  Laterality: N/A;  . KNEE SURGERY      Current Outpatient Prescriptions  Medication Sig Dispense Refill  . aspirin EC 81 MG EC tablet Take 1 tablet (81 mg total) by mouth daily.    . clopidogrel (PLAVIX) 75 MG tablet Take 1 tablet (75 mg total) by mouth daily. 90 tablet 3  . lisinopril (PRINIVIL,ZESTRIL) 40 MG tablet Take 0.5 tablets (20 mg total) by mouth daily. 30 tablet 1  . atorvastatin (LIPITOR) 10 MG tablet Take 1 tablet (10 mg total) by mouth daily. 90 tablet 3   No current facility-administered medications for this visit.    Allergies:  Review of patient's allergies indicates no known allergies.   Social History: The patient  reports that he has never smoked. He has never used smokeless tobacco. He reports that he does not drink alcohol or  use drugs.   ROS:  Please see the history of present illness. Otherwise, complete review of systems is positive for none.  All other systems are reviewed and negative.   Physical Exam: VS:  BP 114/74   Pulse 93   Ht 6' (1.829 m)   Wt 220 lb (99.8 kg)   SpO2 95%   BMI 29.84 kg/m , BMI Body mass index is 29.84 kg/m.  Wt Readings from Last 3 Encounters:  04/01/16 220 lb (99.8 kg)  12/28/15 212 lb (96.2 kg)  12/10/15 217 lb 12.8 oz (98.8 kg)    General: Patient appears comfortable at rest. HEENT: Conjunctiva and lids normal, oropharynx clear. Neck: Supple, no  elevated JVP or carotid bruits, no thyromegaly. Lungs: Clear to auscultation, nonlabored breathing at rest. Cardiac: Regular rate and rhythm, no S3 or significant systolic murmur, no pericardial rub. Thorax: Well-healed sternal incision. Abdomen: Soft, nontender, bowel sounds present, no guarding or rebound. Extremities: No pitting edema, distal pulses 2+. Skin: Warm and dry. Musculoskeletal: No kyphosis. Neuropsychiatric: Alert and oriented x3, affect grossly appropriate.  ECG: I personally reviewed the tracing from 12/10/2015 which showed sinus rhythm with nonspecific T-wave changes.  Recent Labwork: 11/26/2015: ALT 46; AST 28; B Natriuretic Peptide 28.0 11/28/2015: Magnesium 2.5 11/30/2015: Hemoglobin 11.6; Platelets 154 12/17/2015: BUN 25; Creat 1.09; Potassium 4.9; Sodium 138     Component Value Date/Time   CHOL 158 11/26/2015 0436   TRIG 135 11/26/2015 0436   HDL 35 (L) 11/26/2015 0436   CHOLHDL 4.5 11/26/2015 0436   VLDL 27 11/26/2015 0436   LDLCALC 96 11/26/2015 0436    Other Studies Reviewed Today:  Echocardiogram 11/28/2015: Study Conclusions  - Left ventricle: The cavity size was normal. There was mild   concentric hypertrophy. Systolic function was normal. The   estimated ejection fraction was in the range of 60% to 65%. Wall   motion was normal; there were no regional wall motion   abnormalities. Doppler parameters are consistent with abnormal   left ventricular relaxation (grade 1 diastolic dysfunction).   There was no evidence of elevated ventricular filling pressure by   Doppler parameters. - Aortic valve: Trileaflet; normal thickness leaflets. There was no   regurgitation. - Aortic root: The aortic root was normal in size. - Ascending aorta: The ascending aorta was normal in size. - Mitral valve: Structurally normal valve. There was mild   regurgitation. - Left atrium: The atrium was normal in size. - Right ventricle: The cavity size was normal. Wall  thickness was   normal. Systolic function was normal. - Right atrium: The atrium was normal in size. - Tricuspid valve: There was no regurgitation. - Pulmonary arteries: Systolic pressure was within the normal   range. - Inferior vena cava: The vessel was normal in size. - Pericardium, extracardiac: There was no pericardial effusion.  Assessment and Plan:  1. Multivessel CAD status post NSTEMI in June. He is doing well following CABG as outlined above, nearly back to baseline. He is exercising regularly. We discussed his medications. He will continue on aspirin and Plavix, resume beta blocker in the form of Toprol-XL 12.5 mg daily, reduce lisinopril to 10 mg daily, and start Lipitor 10 mg daily (he never took this medication).  2. LDL 96 in June around the time of his cardiac event. We will start low-dose statin and recheck FLP and LFTs for his next visit.  3. Essential hypertension by history, blood pressure is normal today.  4. Mild, asymptomatic carotid atherosclerosis.  Current  medicines were reviewed with the patient today.  Disposition: Follow-up with me in 6 months.  Signed, Jonelle SidleSamuel G. McDowell, MD, [facility] Center[facility] 04/01/2016 1:34 PM    [facility] [address], [place], [place] [address] Phone: [phone]; Fax: [phone]

## 2016-04-01 ENCOUNTER — Encounter: Payer: Self-pay | Admitting: Cardiology

## 2016-04-01 ENCOUNTER — Ambulatory Visit (INDEPENDENT_AMBULATORY_CARE_PROVIDER_SITE_OTHER): Payer: PRIVATE HEALTH INSURANCE | Admitting: Cardiology

## 2016-04-01 VITALS — BP 114/74 | HR 93 | Ht 72.0 in | Wt 220.0 lb

## 2016-04-01 DIAGNOSIS — I10 Essential (primary) hypertension: Secondary | ICD-10-CM | POA: Diagnosis not present

## 2016-04-01 DIAGNOSIS — I2510 Atherosclerotic heart disease of native coronary artery without angina pectoris: Secondary | ICD-10-CM | POA: Diagnosis not present

## 2016-04-01 DIAGNOSIS — I6523 Occlusion and stenosis of bilateral carotid arteries: Secondary | ICD-10-CM | POA: Diagnosis not present

## 2016-04-01 DIAGNOSIS — E782 Mixed hyperlipidemia: Secondary | ICD-10-CM

## 2016-04-01 MED ORDER — LISINOPRIL 10 MG PO TABS: 10.0000 mg | ORAL_TABLET | Freq: Every day | ORAL | 3 refills | Status: DC

## 2016-04-01 MED ORDER — ATORVASTATIN CALCIUM 10 MG PO TABS: 10.0000 mg | ORAL_TABLET | Freq: Every day | ORAL | 3 refills | Status: DC

## 2016-04-01 MED ORDER — METOPROLOL SUCCINATE ER 25 MG PO TB24: 12.5000 mg | ORAL_TABLET | Freq: Every day | ORAL | 3 refills | Status: DC

## 2016-04-01 MED ORDER — CLOPIDOGREL BISULFATE 75 MG PO TABS: 75.0000 mg | ORAL_TABLET | Freq: Every day | ORAL | 3 refills | Status: DC

## 2016-04-01 NOTE — Patient Instructions (Signed)
Your physician wants you to follow-up in: 6 months You will receive a reminder letter in the mail two months in advance. If you don't receive a letter, please call our office to schedule the follow-up appointment.   Take Lisinopril 10 mg daily  Take Atorvastatin 10 mg at dinner  Take Torprol XL 12.5 mg daily  I refilled Plavix 75 mg daily      Get FASTING lab work [employer]JUST BEFORE NEXT VISIT       Thank you for choosing  [facility] !

## 2016-06-28 ENCOUNTER — Other Ambulatory Visit (HOSPITAL_COMMUNITY): Payer: Self-pay | Admitting: Pulmonary Disease

## 2016-06-28 DIAGNOSIS — M545 Low back pain: Secondary | ICD-10-CM

## 2016-07-01 ENCOUNTER — Encounter (HOSPITAL_COMMUNITY): Payer: Self-pay

## 2016-07-01 ENCOUNTER — Ambulatory Visit (HOSPITAL_COMMUNITY): Payer: PRIVATE HEALTH INSURANCE

## 2017-06-09 IMAGING — DX DG CHEST 2V
2 series · 2 of 2 positions shown · non-contrast
Comparison: 11/26/2015

CLINICAL DATA: Preop cardiovascular exam for surgery tomorrow.

EXAM:
CHEST  2 VIEW

[chest pa]
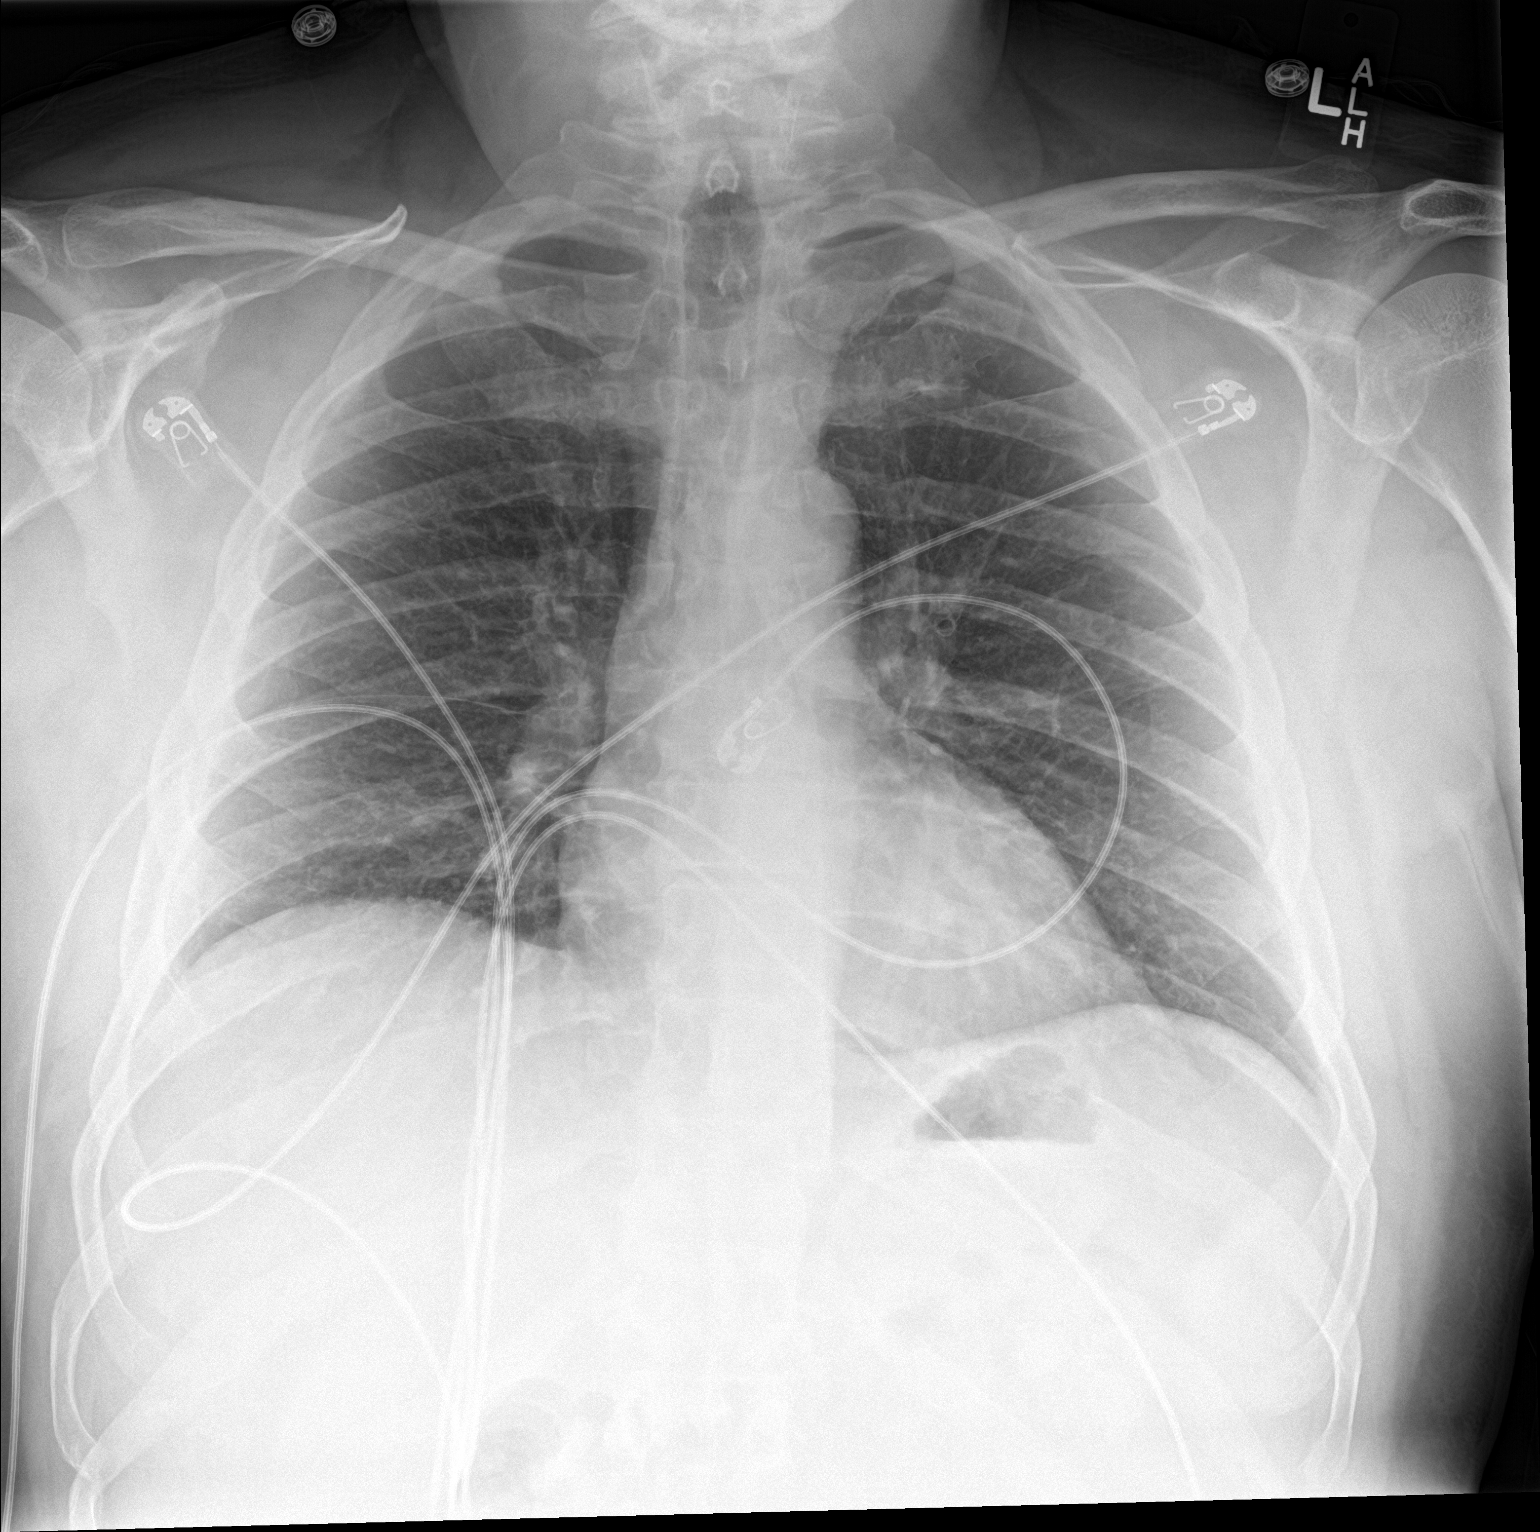

[chest lat]
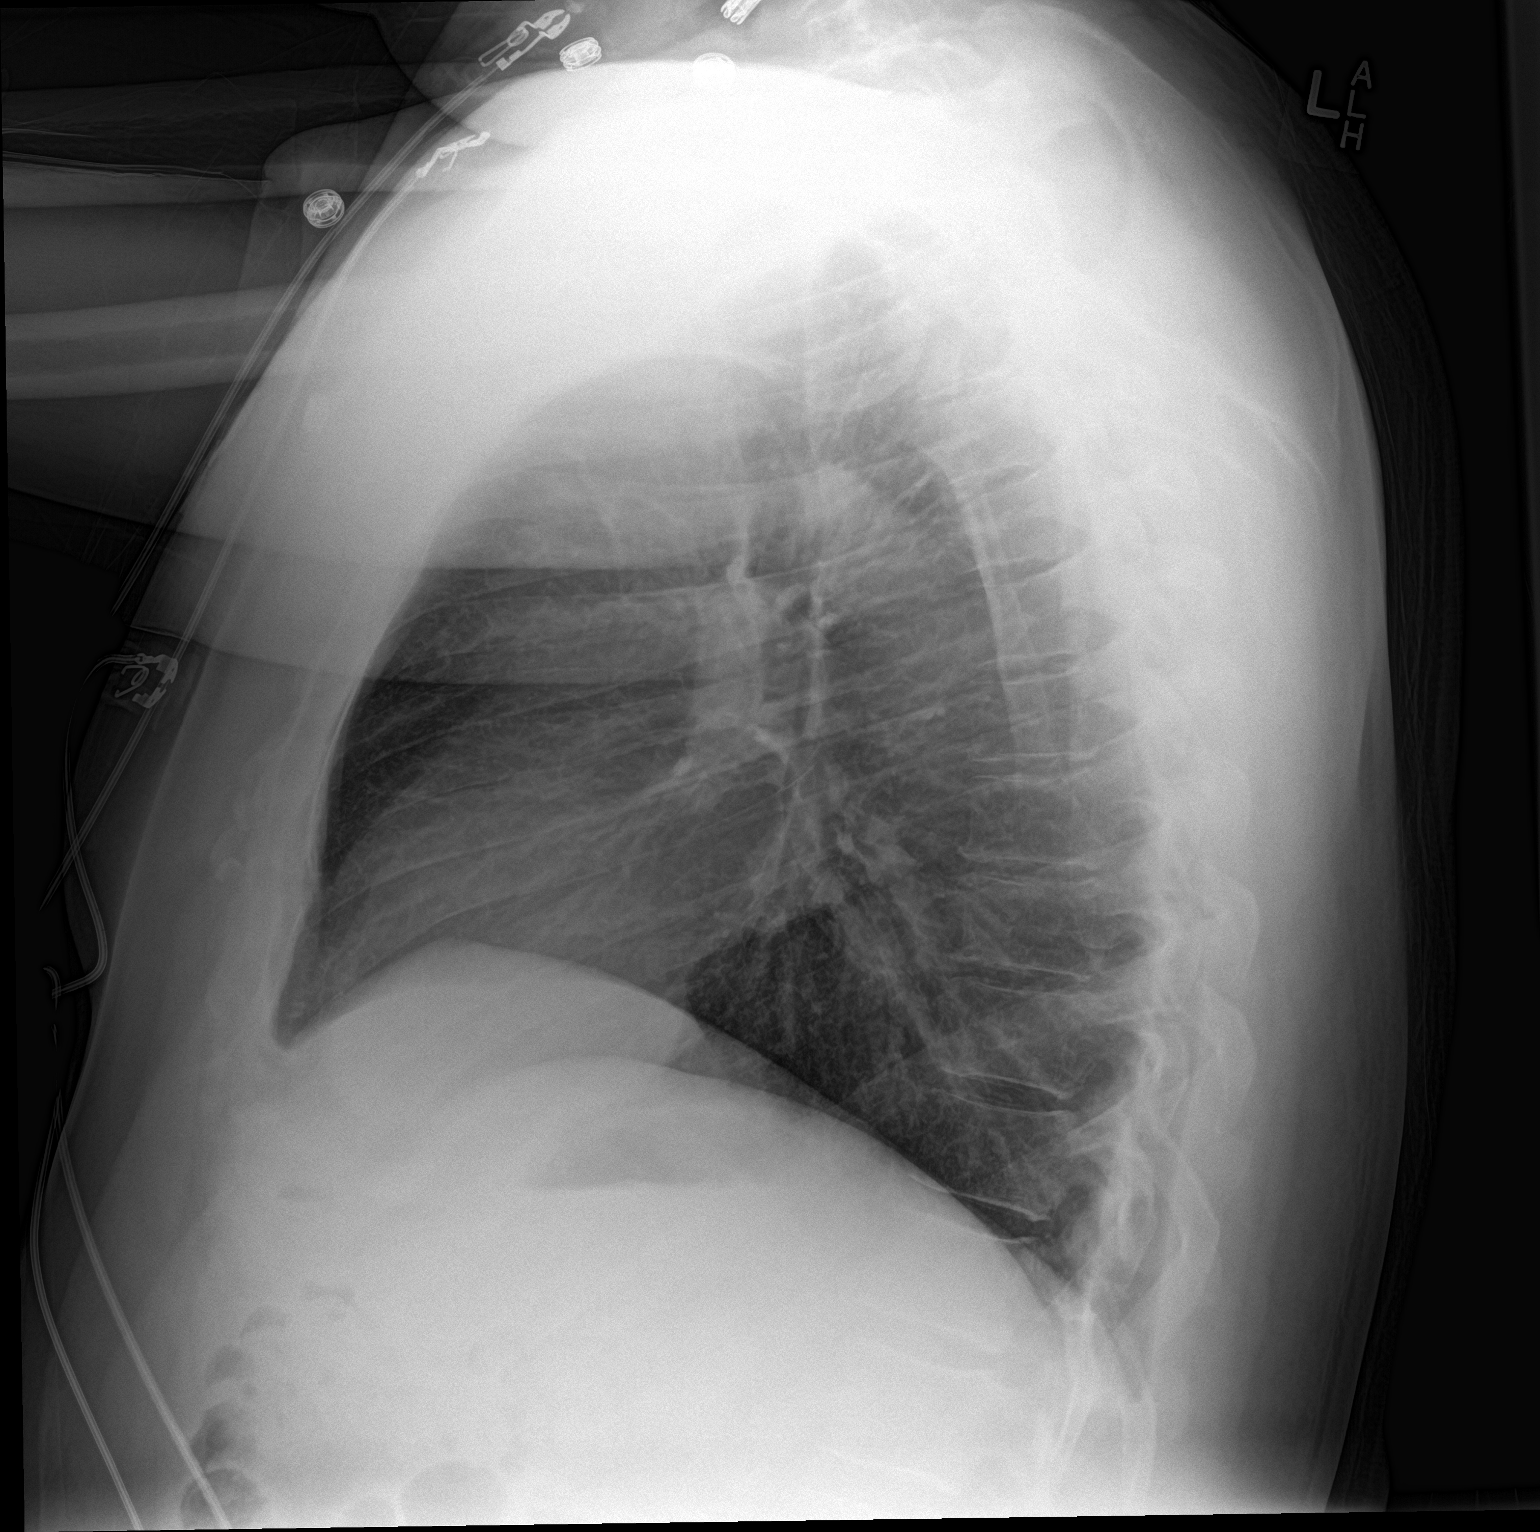

[2 of 2 positions shown; findings below may reference images not displayed]

FINDINGS: The heart size and mediastinal contours are within normal limits.
Both lungs are clear. The visualized skeletal structures are
unremarkable.
IMPRESSION: No active cardiopulmonary disease.

## 2017-06-10 IMAGING — CR DG CHEST 1V PORT
1 series · 1 of 1 positions shown · non-contrast
Comparison: 11/26/2015.

CLINICAL DATA: CABG.

EXAM:
PORTABLE CHEST 1 VIEW

[AP]
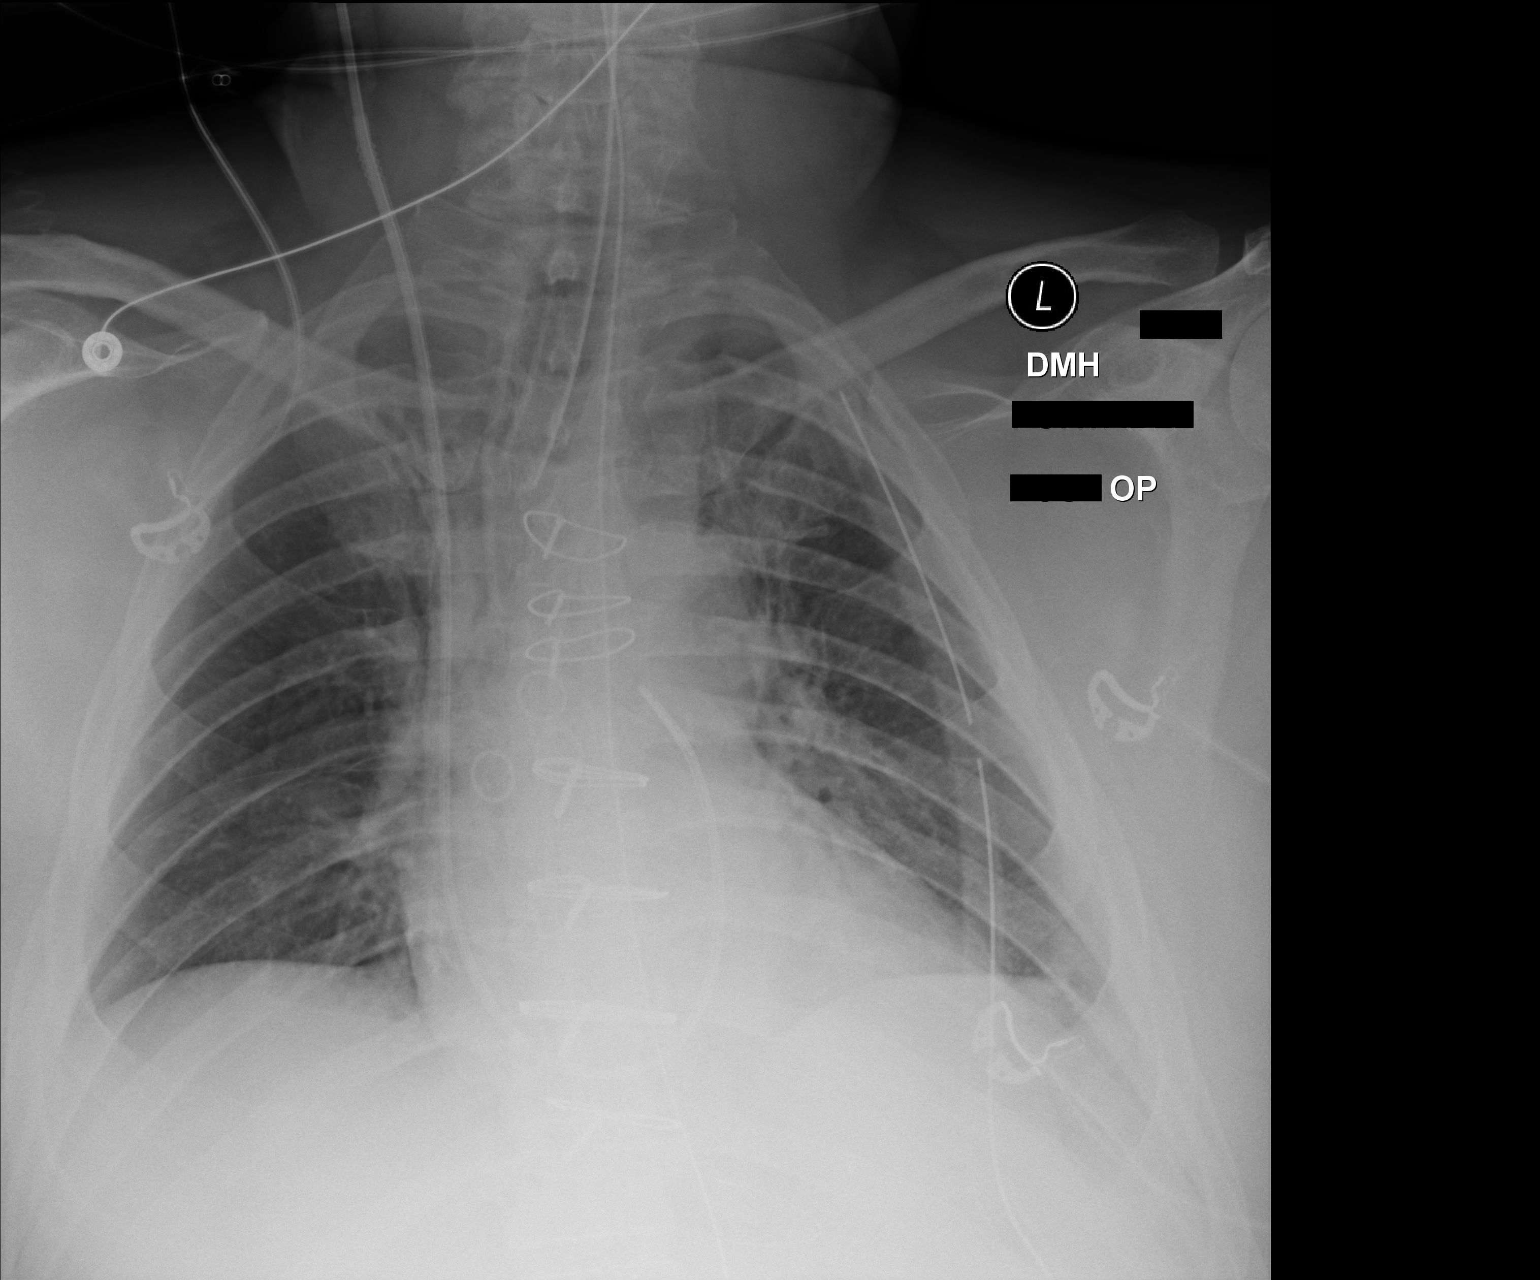

[1 of 1 positions shown; findings below may reference images not displayed]

FINDINGS: Endotracheal tube, NG tube, Swan-Ganz catheter, left chest tube in
good anatomic position. Mild left lower lobe infiltrate. No pleural
effusion or pneumothorax . Prior CABG. Mild cardiomegaly. Low lung
volumes with mild bibasilar atelectasis and/or infiltrates .
IMPRESSION: 1. Endotracheal tube, NG tube, Swan-Ganz catheter, left chest tube
in good anatomic position. No pneumothorax.

2. Prior CABG. Mild cardiomegaly. Low lung volumes with mild basilar
atelectasis and/or infiltrates.

## 2017-06-13 IMAGING — DX DG CHEST 2V
2 series · 2 of 2 positions shown · non-contrast
Comparison: 11/29/2015.

CLINICAL DATA: Prior cardiac surgery.

EXAM:
CHEST  2 VIEW

[chest pa]
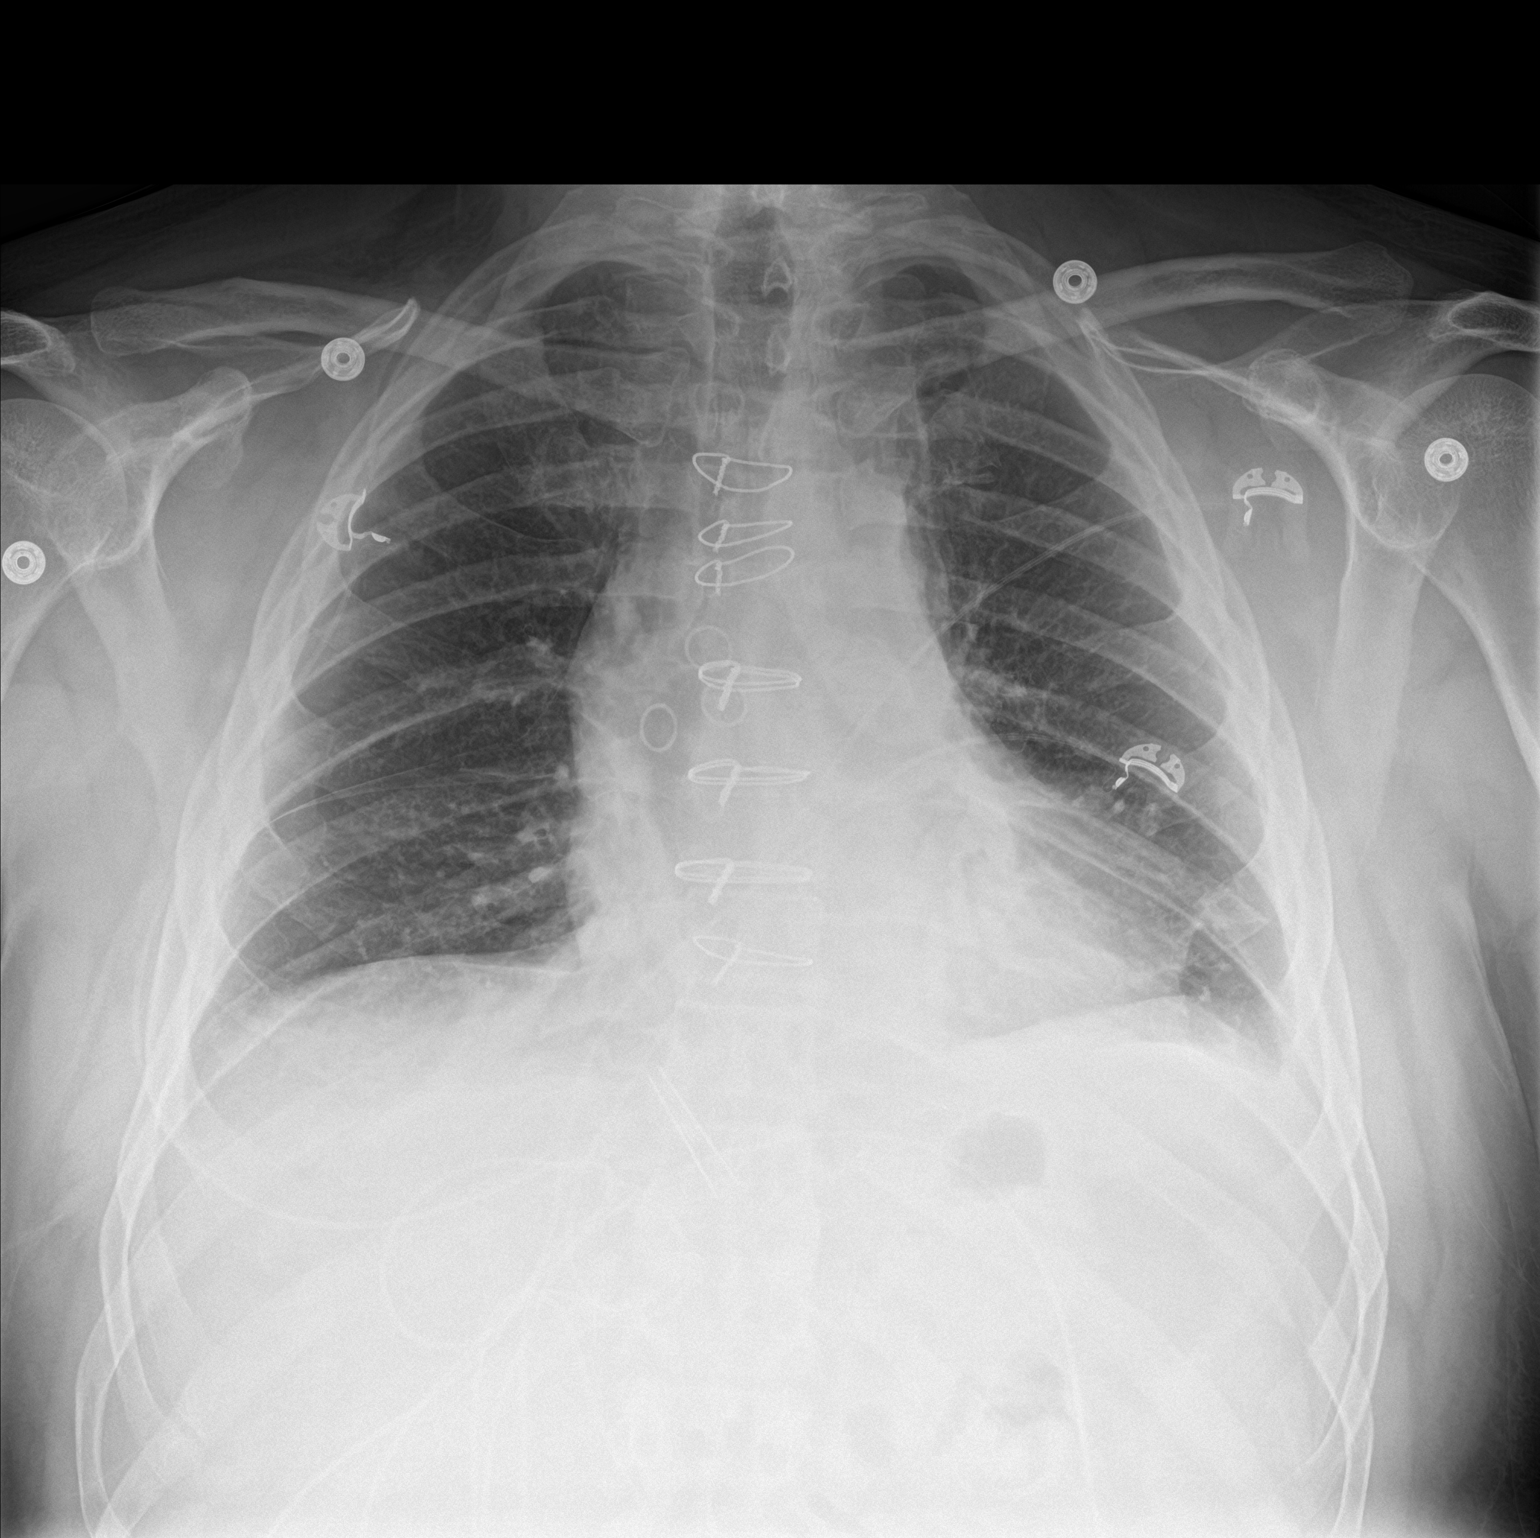

[chest lat]
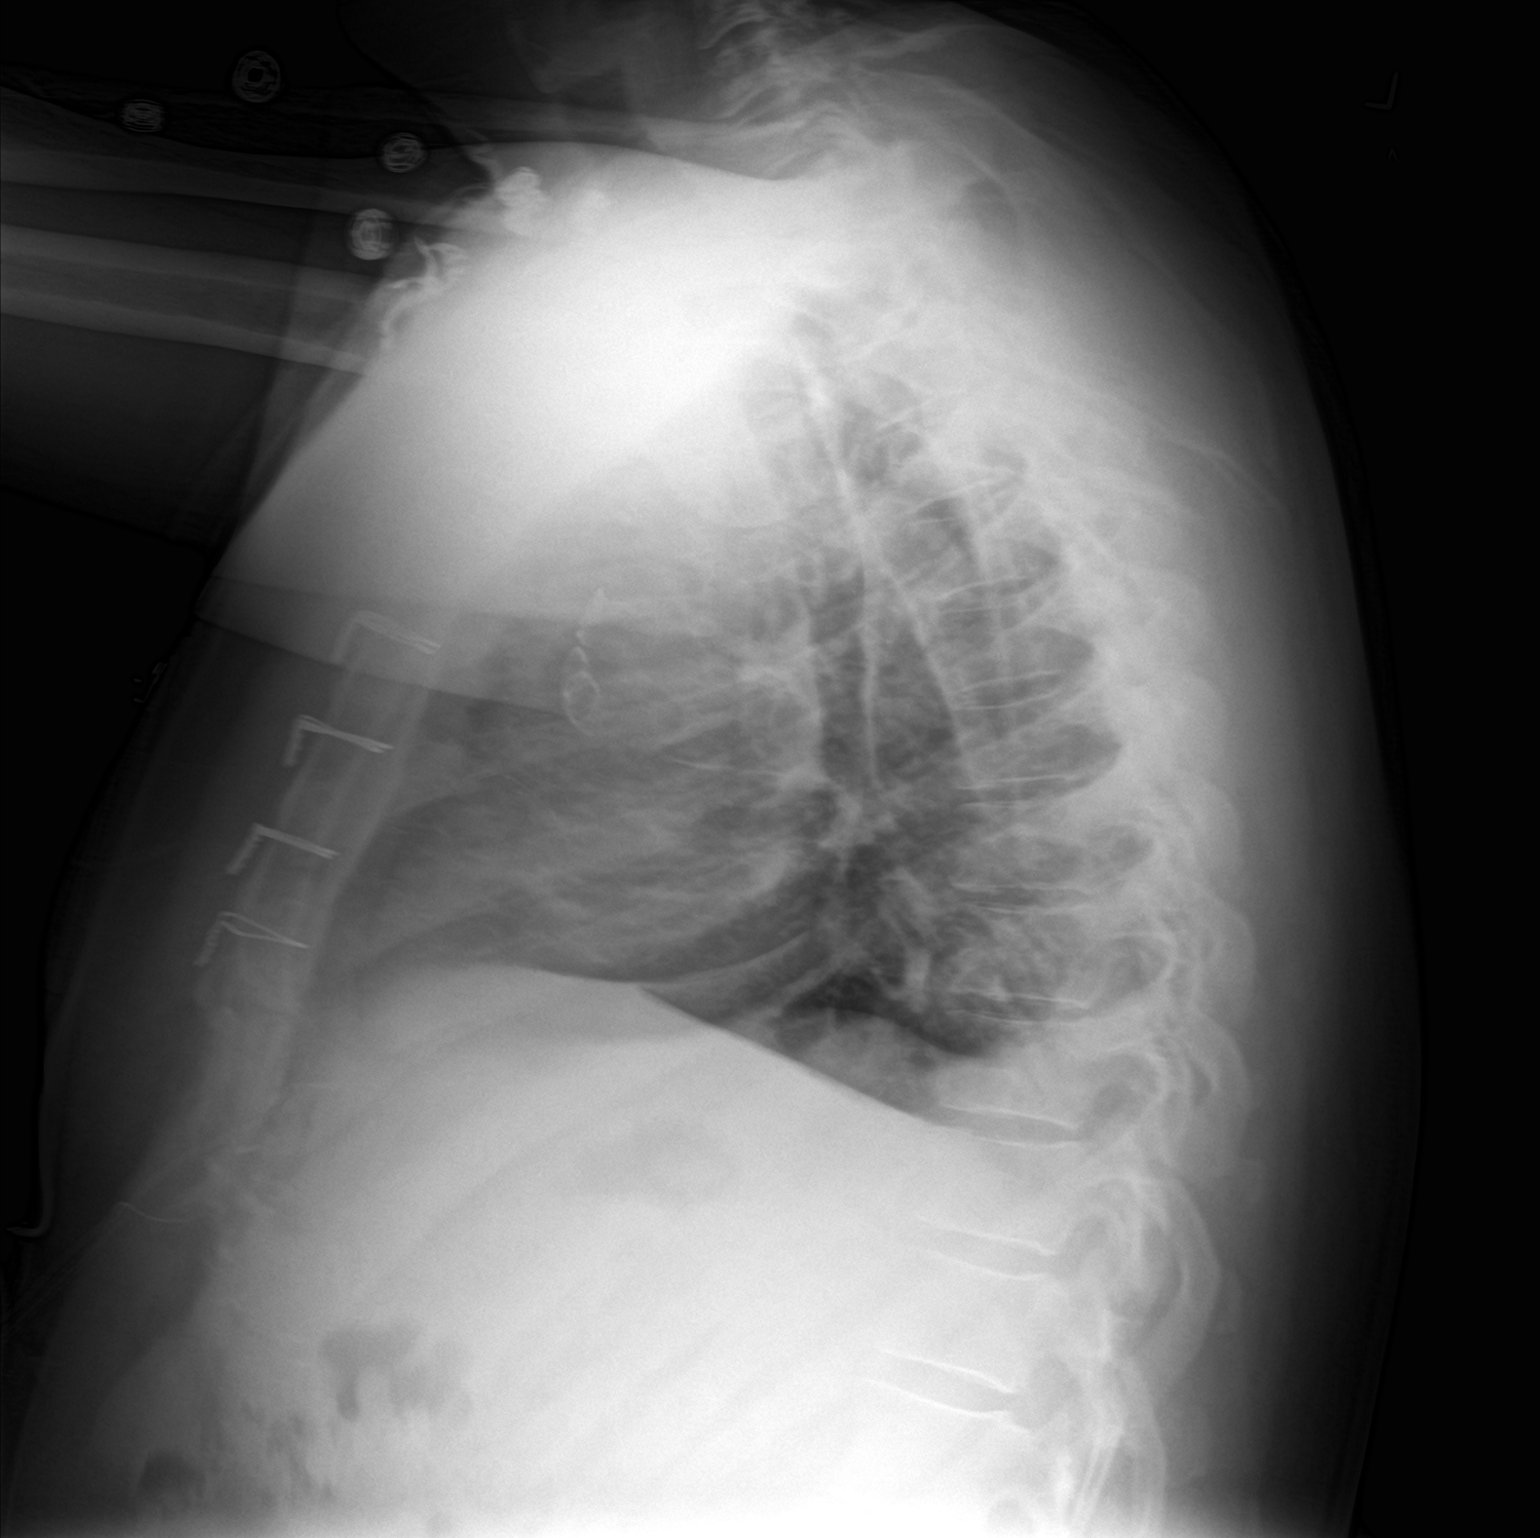

[2 of 2 positions shown; findings below may reference images not displayed]

FINDINGS: Interval removal Swan-Ganz catheter. Prior CABG. Cardiomegaly.
Pulmonary vascularity is normal . Mild left base subsegmental
atelectasis and or infiltrate, improved from prior exam. No pleural
effusion or pneumothorax .
IMPRESSION: 1.  Interim removal of Swan-Ganz catheter.

2. Mild left base subsegmental atelectasis and or infiltrate,
improved from prior exam. Small left pleural effusion.

3. Prior CABG. Stable cardiomegaly. No pulmonary venous congestion .

## 2017-12-28 ENCOUNTER — Other Ambulatory Visit: Payer: Self-pay | Admitting: Cardiology

## 2018-06-14 ENCOUNTER — Emergency Department (HOSPITAL_COMMUNITY): Payer: PRIVATE HEALTH INSURANCE

## 2018-06-14 ENCOUNTER — Emergency Department (HOSPITAL_COMMUNITY)
Admission: EM | Admit: 2018-06-14 | Discharge: 2018-06-14 | Disposition: A | Discharge status: Home / Self Care | Payer: PRIVATE HEALTH INSURANCE | Attending: Emergency Medicine | Admitting: Emergency Medicine

## 2018-06-14 ENCOUNTER — Encounter (HOSPITAL_COMMUNITY): Payer: Self-pay

## 2018-06-14 DIAGNOSIS — I10 Essential (primary) hypertension: Secondary | ICD-10-CM | POA: Insufficient documentation

## 2018-06-14 DIAGNOSIS — Z7982 Long term (current) use of aspirin: Secondary | ICD-10-CM | POA: Insufficient documentation

## 2018-06-14 DIAGNOSIS — M542 Cervicalgia: Secondary | ICD-10-CM | POA: Insufficient documentation

## 2018-06-14 DIAGNOSIS — Z79899 Other long term (current) drug therapy: Secondary | ICD-10-CM | POA: Insufficient documentation

## 2018-06-14 DIAGNOSIS — R079 Chest pain, unspecified: Secondary | ICD-10-CM | POA: Insufficient documentation

## 2018-06-14 DIAGNOSIS — R42 Dizziness and giddiness: Secondary | ICD-10-CM | POA: Insufficient documentation

## 2018-06-14 LAB — CBC
HCT: 50.1 % (ref 39.0–52.0)
HEMOGLOBIN: 15.9 g/dL (ref 13.0–17.0)
MCH: 27 pg (ref 26.0–34.0)
MCHC: 31.7 g/dL (ref 30.0–36.0)
MCV: 85.1 fL (ref 80.0–100.0)
Platelets: 345 10*3/uL (ref 150–400)
RBC: 5.89 MIL/uL — ABNORMAL HIGH (ref 4.22–5.81)
RDW: 14.2 % (ref 11.5–15.5)
WBC: 6.7 10*3/uL (ref 4.0–10.5)
nRBC: 0 % (ref 0.0–0.2)

## 2018-06-14 LAB — I-STAT TROPONIN, ED
TROPONIN I, POC: 0.09 ng/mL — AB (ref 0.00–0.08)
Troponin i, poc: 0.09 ng/mL (ref 0.00–0.08)

## 2018-06-14 LAB — BASIC METABOLIC PANEL
Anion gap: 12 (ref 5–15)
BUN: 10 mg/dL (ref 6–20)
CALCIUM: 9.2 mg/dL (ref 8.9–10.3)
CO2: 22 mmol/L (ref 22–32)
CREATININE: 1.19 mg/dL (ref 0.61–1.24)
Chloride: 104 mmol/L (ref 98–111)
GFR calc Af Amer: >60 mL/min (ref >60)
Glucose, Bld: 97 mg/dL (ref 70–99)
POTASSIUM: 4.2 mmol/L (ref 3.5–5.1)
SODIUM: 138 mmol/L (ref 135–145)

## 2018-06-14 MED ORDER — METOPROLOL SUCCINATE ER 25 MG PO TB24: 12.5000 mg | ORAL_TABLET | Freq: Every day | ORAL | 0 refills | Status: DC

## 2018-06-14 MED ORDER — LISINOPRIL 10 MG PO TABS
10.0000 mg | ORAL_TABLET | Freq: Every day | ORAL | Status: DC
  Administered 2018-06-14: 10 mg via ORAL
  Filled 2018-06-14: qty 1

## 2018-06-14 MED ORDER — METOPROLOL SUCCINATE ER 25 MG PO TB24
12.5000 mg | ORAL_TABLET | Freq: Every day | ORAL | Status: DC
  Administered 2018-06-14: 12.5 mg via ORAL
  Filled 2018-06-14: qty 1

## 2018-06-14 MED ORDER — LISINOPRIL 10 MG PO TABS: ORAL_TABLET | ORAL | 0 refills | Status: DC

## 2018-06-14 MED ORDER — IOPAMIDOL (ISOVUE-370) INJECTION 76%
INTRAVENOUS | Status: AC
  Administered 2018-06-14: 100 mL
  Filled 2018-06-14: qty 100

## 2018-06-14 MED ORDER — ATORVASTATIN CALCIUM 10 MG PO TABS: 10.0000 mg | ORAL_TABLET | Freq: Every day | ORAL | 0 refills | Status: DC

## 2018-06-14 MED ORDER — CLOPIDOGREL BISULFATE 75 MG PO TABS: 75.0000 mg | ORAL_TABLET | Freq: Every day | ORAL | 0 refills | Status: DC

## 2018-06-14 NOTE — ED Provider Notes (Signed)
[facility] EMERGENCY DEPARTMENT Provider Note   CSN: [id number] Arrival date & time: 06/14/18  1643     History   Chief Complaint Chief Complaint  Patient presents with  . Chest Pain    HPI Justin Villa is a 53 y.o. male.  He is a history of coronary artery disease and is status post CABG couple years ago.  History of hypertension.  He is presenting with acute onset of sharp stabbing left posterior neck pain that was 7 out of 10 intensity which generalized also to involve his chest with associated nausea and some feeling like shadows crossing his vision.  This is no resolved.  He is never had this before.  He said he is felt a little more short of breath recently although still exercises and is not limited by that.  He is quite hypertensive here he says this is higher than his usual slightly high blood pressure.  No numbness or weakness.  No loss of consciousness.  The history is provided by the patient.  Chest Pain  Pain location:  L chest and substernal area Pain quality: aching   Pain radiates to:  Neck Pain severity:  Moderate Onset quality:  Sudden Progression:  Resolved Chronicity:  New Context: at rest   Context: not breathing   Relieved by:  Nothing Worsened by:  Nothing Ineffective treatments:  None tried Associated symptoms: dizziness and nausea   Associated symptoms: no abdominal pain, no back pain, no claudication, no cough, no diaphoresis, no dysphagia, no fever, no headache, no shortness of breath, no syncope and no vomiting   Risk factors: coronary artery disease and hypertension     Past Medical History:  Diagnosis Date  . CAD (coronary artery disease) 11/27/2015   a. S/p NSTEMI 6/17 >> s/p CABG // b. LHC 6/17: oLAD 50, mLAD 100, D1 70, D2 95, oLCx 95, mLCx 65, OM1 85, OM2 90, mRCA 85, dRCA 75, RPDA 40/50, RPLB2 50   . Carotid stenosis    a. Carotid US 6/17: bilat ICA 1-39%  . Essential hypertension   . History of non-ST elevation  myocardial infarction (NSTEMI) 11/2015  . Hyperlipidemia     Patient Active Problem List   Diagnosis Date Noted  . HTN (hypertension) 12/10/2015  . HLD (hyperlipidemia) 12/10/2015  . Carotid stenosis   . CAD (coronary artery disease) 11/27/2015  . NSTEMI (non-ST elevated myocardial infarction) (HCC) 11/26/2015  . ARTHRITIS, WRIST 07/19/2010    Past Surgical History:  Procedure Laterality Date  . BACK SURGERY    . CARDIAC CATHETERIZATION N/A 11/26/2015   Procedure: Left Heart Cath and Coronary Angiography;  Surgeon: Lyn Records, MD;  Location: [facility] INVASIVE CV LAB;  Service: Cardiovascular;  Laterality: N/A;  . CORONARY ARTERY BYPASS GRAFT N/A 11/27/2015   Procedure: CORONARY ARTERY BYPASS GRAFTING (CABG) x 5 (LIMA to LAD, SVG to DIAGONAL, SVG SEQUENTIALLY to OM1 and OM2, SVG to PDA) with EVH from right leg using greater saphenous vein and mammary.;  Surgeon: Delight Ovens, MD;  Location: [facility] OR;  Service: Open Heart Surgery;  Laterality: N/A;  . INTRAOPERATIVE TRANSESOPHAGEAL ECHOCARDIOGRAM N/A 11/27/2015   Procedure: INTRAOPERATIVE TRANSESOPHAGEAL ECHOCARDIOGRAM;  Surgeon: Delight Ovens, MD;  Location: [facility] OR;  Service: Open Heart Surgery;  Laterality: N/A;  . KNEE SURGERY          Home Medications    Prior to Admission medications   Medication Sig Start Date End Date Taking? Authorizing Provider  aspirin EC 81  MG EC tablet Take 1 tablet (81 mg total) by mouth daily. 12/01/15   Ardelle Balls, PA-C  atorvastatin (LIPITOR) 10 MG tablet Take 1 tablet (10 mg total) by mouth daily. 04/01/16 06/30/16  Jonelle Sidle, MD  clopidogrel (PLAVIX) 75 MG tablet Take 1 tablet (75 mg total) by mouth daily. 04/01/16   Jonelle Sidle, MD  lisinopril (PRINIVIL,ZESTRIL) 10 MG tablet TAKE 1 TABLET(10 MG) BY MOUTH DAILY 12/28/17   Jonelle Sidle, MD  metoprolol succinate (TOPROL XL) 25 MG 24 hr tablet Take 0.5 tablets (12.5 mg total) by mouth daily. 04/01/16   Jonelle Sidle,  MD    Family History Family History  Problem Relation Age of Onset  . Heart disease Father     Social History Social History   Tobacco Use  . Smoking status: Never Smoker  . Smokeless tobacco: Never Used  Substance Use Topics  . Alcohol use: No  . Drug use: No     Allergies   Patient has no known allergies.   Review of Systems Review of Systems  Constitutional: Negative for diaphoresis and fever.  HENT: Negative for sore throat and trouble swallowing.   Eyes: Positive for visual disturbance. Negative for pain.  Respiratory: Negative for cough and shortness of breath.   Cardiovascular: Positive for chest pain. Negative for claudication and syncope.  Gastrointestinal: Positive for nausea. Negative for abdominal pain and vomiting.  Genitourinary: Negative for dysuria.  Musculoskeletal: Positive for neck pain. Negative for back pain.  Skin: Negative for rash.  Neurological: Positive for dizziness. Negative for seizures, syncope, speech difficulty and headaches.     Physical Exam Updated Vital Signs BP (!) 201/126   Pulse 83   Temp 97.6 F (36.4 C) (Oral)   Resp 15   SpO2 98%   Physical Exam Vitals signs and nursing note reviewed.  Constitutional:      Appearance: He is well-developed.  HENT:     Head: Normocephalic and atraumatic.  Eyes:     Conjunctiva/sclera: Conjunctivae normal.  Neck:     Musculoskeletal: Neck supple.  Cardiovascular:     Rate and Rhythm: Normal rate and regular rhythm.     Heart sounds: No murmur. No diastolic murmur.  Pulmonary:     Effort: Pulmonary effort is normal. No respiratory distress.     Breath sounds: Normal breath sounds.  Abdominal:     Palpations: Abdomen is soft.     Tenderness: There is no abdominal tenderness.  Musculoskeletal: Normal range of motion.     Right lower leg: He exhibits no tenderness. No edema.     Left lower leg: He exhibits no tenderness. No edema.  Skin:    General: Skin is warm and dry.      Capillary Refill: Capillary refill takes less than 2 seconds.  Neurological:     General: No focal deficit present.     Mental Status: He is alert and oriented to person, place, and time.     Cranial Nerves: No cranial nerve deficit.     Motor: No weakness.      ED Treatments / Results  Labs (all labs ordered are listed, but only abnormal results are displayed) Labs Reviewed  CBC - Abnormal; Notable for the following components:      Result Value   RBC 5.89 (*)    All other components within normal limits  I-STAT TROPONIN, ED - Abnormal; Notable for the following components:   Troponin i, poc 0.09 (*)  All other components within normal limits  I-STAT TROPONIN, ED - Abnormal; Notable for the following components:   Troponin i, poc 0.09 (*)    All other components within normal limits  BASIC METABOLIC PANEL    EKG EKG Interpretation  Date/Time:  Thursday June 14 2018 16:50:35 EST Ventricular Rate:  86 PR Interval:  148 QRS Duration: 86 QT Interval:  354 QTC Calculation: 423 R Axis:   -42 Text Interpretation:  Normal sinus rhythm Possible Left atrial enlargement Left axis deviation Nonspecific T wave abnormality Abnormal ECG compared with prior 6/17 Confirmed by Meridee Score [phone]) on 06/14/2018 5:05:37 PM   Radiology Ct Angio Head W Or Wo Contrast  Result Date: 06/14/2018 CLINICAL DATA:  Sudden onset left-sided neck pain radiating to the chest. EXAM: CT ANGIOGRAPHY HEAD AND NECK TECHNIQUE: Multidetector CT imaging of the head and neck was performed using the standard protocol during bolus administration of intravenous contrast. Multiplanar CT image reconstructions and MIPs were obtained to evaluate the vascular anatomy. Carotid stenosis measurements (when applicable) are obtained utilizing NASCET criteria, using the distal internal carotid diameter as the denominator. CONTRAST:  ISOVUE-370 IOPAMIDOL (ISOVUE-370) INJECTION 76% COMPARISON:  None. FINDINGS: CT HEAD  FINDINGS Brain: There is no evidence of acute infarct, intracranial hemorrhage, mass, midline shift, or extra-axial fluid collection. The ventricles are normal in size. There is a small right occipital cortical infarct which appears chronic. Vascular: Mild calcified atherosclerosis at the skull base. Skull: No fracture or focal osseous lesion. Sinuses: Right maxillary sinus mucous retention cyst. Clear mastoid air cells. Orbits: Unremarkable. Review of the MIP images confirms the above findings CTA NECK FINDINGS Aortic arch: Standard 3 vessel aortic arch. Widely patent arch vessel origins. Kinked appearance of the proximal right subclavian artery with suggestion of superimposed soft plaque resulting in mild-to-moderate stenosis. Eccentric soft plaque in the left subclavian artery proximal to the vertebral artery origin results in mild stenosis. Right carotid system: Patent without evidence of stenosis or dissection. Left carotid system: Patent with small volume soft plaque in the distal common carotid and proximal external carotid arteries. No evidence of significant stenosis or dissection. Vertebral arteries: Patent and strongly dominant left vertebral artery without evidence of significant stenosis or dissection patent but diffusely hypoplastic right vertebral artery. Skeleton: Moderate cervical disc and facet degeneration. Interbody and facet ankylosis at C4-5. Other neck: No evidence of acute abnormality or mass. Upper chest: Clear lung apices. Review of the MIP images confirms the above findings CTA HEAD FINDINGS Anterior circulation: The internal carotid arteries are patent from skull base to carotid termini with mild atherosclerosis not resulting in significant stenosis. An inferiorly projecting right paraophthalmic ICA aneurysm measures 3 mm. ACAs and MCAs are patent with mild branch vessel irregularity but no evidence of proximal branch occlusion or significant proximal stenosis. Posterior circulation: The  intracranial vertebral arteries are patent to the basilar with the left being strongly dominant. Patent PICA, AICA, and SCA origins are identified bilaterally. The basilar artery is patent without significant stenosis. Posterior communicating arteries are diminutive or absent. PCAs are patent with moderate branch vessel irregularity as well as a moderate left P1-P2 junction stenosis. No aneurysm is identified. Venous sinuses: Grossly patent on delayed images. Anatomic variants: None. Delayed phase: No abnormal enhancement. Review of the MIP images confirms the above findings IMPRESSION: 1. No evidence of acute intracranial abnormality. 2. Small chronic right occipital cortical infarct. 3. No large vessel occlusion or dissection. 4. 3 mm right paraophthalmic ICA aneurysm. 5. Moderate proximal  left PCA stenosis. 6. Mild cervical carotid artery atherosclerosis without stenosis. 7. Mild-to-moderate right and mild left subclavian artery stenoses. Electronically Signed   By: Sebastian AcheAllen  Grady M.D.   On: 06/14/2018 19:09   Dg Chest 2 View  Result Date: 06/14/2018 CLINICAL DATA:  Shortness of breath. EXAM: CHEST - 2 VIEW COMPARISON:  12/28/2015 FINDINGS: The lungs are clear without focal pneumonia, edema, pneumothorax or pleural effusion. The cardiopericardial silhouette is within normal limits for size. Status post CABG. The visualized bony structures of the thorax are intact. Telemetry leads overlie the chest. IMPRESSION: No active cardiopulmonary disease. Electronically Signed   By: Kennith CenterEric  Mansell M.D.   On: 06/14/2018 17:41   Ct Angio Neck W Or Wo Contrast  Result Date: 06/14/2018 CLINICAL DATA:  Sudden onset left-sided neck pain radiating to the chest. EXAM: CT ANGIOGRAPHY HEAD AND NECK TECHNIQUE: Multidetector CT imaging of the head and neck was performed using the standard protocol during bolus administration of intravenous contrast. Multiplanar CT image reconstructions and MIPs were obtained to evaluate the vascular  anatomy. Carotid stenosis measurements (when applicable) are obtained utilizing NASCET criteria, using the distal internal carotid diameter as the denominator. CONTRAST:  100mL ISOVUE-370 IOPAMIDOL (ISOVUE-370) INJECTION 76% COMPARISON:  None. FINDINGS: CT HEAD FINDINGS Brain: There is no evidence of acute infarct, intracranial hemorrhage, mass, midline shift, or extra-axial fluid collection. The ventricles are normal in size. There is a small right occipital cortical infarct which appears chronic. Vascular: Mild calcified atherosclerosis at the skull base. Skull: No fracture or focal osseous lesion. Sinuses: Right maxillary sinus mucous retention cyst. Clear mastoid air cells. Orbits: Unremarkable. Review of the MIP images confirms the above findings CTA NECK FINDINGS Aortic arch: Standard 3 vessel aortic arch. Widely patent arch vessel origins. Kinked appearance of the proximal right subclavian artery with suggestion of superimposed soft plaque resulting in mild-to-moderate stenosis. Eccentric soft plaque in the left subclavian artery proximal to the vertebral artery origin results in mild stenosis. Right carotid system: Patent without evidence of stenosis or dissection. Left carotid system: Patent with small volume soft plaque in the distal common carotid and proximal external carotid arteries. No evidence of significant stenosis or dissection. Vertebral arteries: Patent and strongly dominant left vertebral artery without evidence of significant stenosis or dissection patent but diffusely hypoplastic right vertebral artery. Skeleton: Moderate cervical disc and facet degeneration. Interbody and facet ankylosis at C4-5. Other neck: No evidence of acute abnormality or mass. Upper chest: Clear lung apices. Review of the MIP images confirms the above findings CTA HEAD FINDINGS Anterior circulation: The internal carotid arteries are patent from skull base to carotid termini with mild atherosclerosis not resulting in  significant stenosis. An inferiorly projecting right paraophthalmic ICA aneurysm measures 3 mm. ACAs and MCAs are patent with mild branch vessel irregularity but no evidence of proximal branch occlusion or significant proximal stenosis. Posterior circulation: The intracranial vertebral arteries are patent to the basilar with the left being strongly dominant. Patent PICA, AICA, and SCA origins are identified bilaterally. The basilar artery is patent without significant stenosis. Posterior communicating arteries are diminutive or absent. PCAs are patent with moderate branch vessel irregularity as well as a moderate left P1-P2 junction stenosis. No aneurysm is identified. Venous sinuses: Grossly patent on delayed images. Anatomic variants: None. Delayed phase: No abnormal enhancement. Review of the MIP images confirms the above findings IMPRESSION: 1. No evidence of acute intracranial abnormality. 2. Small chronic right occipital cortical infarct. 3. No large vessel occlusion or dissection. 4. 3  mm right paraophthalmic ICA aneurysm. 5. Moderate proximal left PCA stenosis. 6. Mild cervical carotid artery atherosclerosis without stenosis. 7. Mild-to-moderate right and mild left subclavian artery stenoses. Electronically Signed   By: Sebastian AcheAllen  Grady M.D.   On: 06/14/2018 19:09    Procedures Procedures (including critical care time)  Medications Ordered in ED Medications - No data to display   Initial Impression / Assessment and Plan / ED Course  I have reviewed the triage vital signs and the nursing notes.  Pertinent labs & imaging results that were available during my care of the patient were reviewed by me and considered in my medical decision making (see chart for details).  Clinical Course as of Jun 14 2225  Morey Hummingbirdhu Jun 14, 2018  [id number] Patient's imaging does not show any obvious vascular reason for his symptoms.  It does show a small stroke old appearing in his occipital lobe.  I reviewed this with the  patient.  His blood pressure still quite here here now he tells me that he has not taken his blood pressure medicine in a month.  Will order his BP meds.   [MB]  1950 Second troponin slightly elevated but unchanged.  This is probably a little leak from his hypertension that is accelerated since he has not been taking his medicines for a month.   [MB]  2121 Abortive patient's home blood pressure meds and his blood pressure is improving here.  We will discharge him with new prescriptions for those and recommendation that he follow-up with his PCP.   [MB]    Clinical Course User Index [MB] Terrilee FilesButler,  C, MD     Final Clinical Impressions(s) / ED Diagnoses   Final diagnoses:  Hypertension, unspecified type  Nonspecific chest pain  Neck pain, acute    ED Discharge Orders         Ordered    atorvastatin (LIPITOR) 10 MG tablet  Daily     06/14/18 2144    clopidogrel (PLAVIX) 75 MG tablet  Daily     06/14/18 2144    lisinopril (PRINIVIL,ZESTRIL) 10 MG tablet    Note to Pharmacy:  NEEDS APT for REFILLS [id number] [phone]   06/14/18 2144    metoprolol succinate (TOPROL XL) 25 MG 24 hr tablet  Daily     06/14/18 2144           Terrilee FilesButler,  C, MD 06/14/18 2227

## 2018-06-14 NOTE — ED Notes (Signed)
I-Stat Troponin results of 0.09 reported to Dr. Charm Barges.

## 2018-06-14 NOTE — Discharge Instructions (Addendum)
You are seen in the emergency department for neck and chest pain.  You had blood work and EKG and a CAT scan that did not show an obvious cause of your symptoms.  Your blood pressure was quite elevated here and we are restarting the medications.  You will need to follow-up with your primary care doctor for further evaluation.  Please return if any concerns.

## 2018-06-14 NOTE — ED Triage Notes (Signed)
Pt reports sudden onset of left sided, sharp neck pain about 1 hr ago that now radiates to his chest. Pain 7/10. nad noted in triage

## 2018-06-14 NOTE — ED Notes (Signed)
Notified Dr. Charm Barges pt's elevated trop

## 2018-07-16 ENCOUNTER — Ambulatory Visit (INDEPENDENT_AMBULATORY_CARE_PROVIDER_SITE_OTHER): Payer: PRIVATE HEALTH INSURANCE | Admitting: Cardiology

## 2018-07-16 ENCOUNTER — Encounter: Payer: Self-pay | Admitting: Cardiology

## 2018-07-16 VITALS — BP 160/100 | HR 69 | Ht 72.0 in | Wt 225.0 lb

## 2018-07-16 DIAGNOSIS — Z79899 Other long term (current) drug therapy: Secondary | ICD-10-CM

## 2018-07-16 DIAGNOSIS — E782 Mixed hyperlipidemia: Secondary | ICD-10-CM | POA: Diagnosis not present

## 2018-07-16 DIAGNOSIS — I25119 Atherosclerotic heart disease of native coronary artery with unspecified angina pectoris: Secondary | ICD-10-CM

## 2018-07-16 DIAGNOSIS — I10 Essential (primary) hypertension: Secondary | ICD-10-CM | POA: Diagnosis not present

## 2018-07-16 MED ORDER — ATORVASTATIN CALCIUM 10 MG PO TABS: 10.0000 mg | ORAL_TABLET | Freq: Every day | ORAL | 3 refills | Status: DC

## 2018-07-16 MED ORDER — CLOPIDOGREL BISULFATE 75 MG PO TABS: 75.0000 mg | ORAL_TABLET | Freq: Every day | ORAL | 3 refills | Status: DC

## 2018-07-16 MED ORDER — LISINOPRIL 20 MG PO TABS: ORAL_TABLET | ORAL | 3 refills | Status: DC

## 2018-07-16 MED ORDER — METOPROLOL SUCCINATE ER 25 MG PO TB24: 12.5000 mg | ORAL_TABLET | Freq: Every day | ORAL | 3 refills | Status: DC

## 2018-07-16 NOTE — Patient Instructions (Signed)
Medication Instructions:  INCREASE Lisinopril to 20 mg daily If you need a refill on your cardiac medications before your next appointment, please call your pharmacy.   Lab work: In 10 days, get BMET lab work If you have labs (blood work) drawn today and your tests are completely normal, you will receive your results only by: Marland Kitchen MyChart Message (if you have MyChart) OR . A paper copy in the mail If you have any lab test that is abnormal or we need to change your treatment, we will call you to review the results.  Testing/Procedures: None today  Follow-Up: At [facility], you and your health needs are our priority.  As part of our continuing mission to provide you with exceptional heart care, we have created designated Provider Care Teams.  These Care Teams include your primary Cardiologist (physician) and Advanced Practice Providers (APPs -  Physician Assistants and Nurse Practitioners) who all work together to provide you with the care you need, when you need it. You will need a follow up appointment in 3 months.  Please call our office 2 months in advance to schedule this appointment.  You may see Dr.McDowell  or one of the following Advanced Practice Providers on your designated Care Team:   [place], PA-C [facility]) . Jacolyn Reedy, PA-C [facility] [place] Office)  Any Other Special Instructions Will Be Listed Below (If Applicable).  return for nurse BP check in 10 days

## 2018-07-16 NOTE — Progress Notes (Signed)
Cardiology Office Note  Date: 07/16/2018   ID: Justin ArntDavid L Husain, DOB 03/25/1966, MRN [id number]  PCP: Kari BaarsHawkins, Edward, MD  Primary Cardiologist: Nona DellSamuel McDowell, MD   Chief Complaint  Patient presents with  . Coronary Artery Disease    History of Present Illness: Justin Villa is a 53 y.o. male not seen in the office since our first meeting back in October 2017.  I reviewed recent records, he was seen in the emergency department back in January with chest pain and substantially elevated blood pressure - was not taking regular medications at that time.  ECG showed no acute ST segment changes, point-of-care troponin I levels were 0.09.  He presents today for follow-up, ran out of his medications this past Saturday.  He does not report reproducible angina at this point, exercises almost every day of the week at the gym using weights and also doing cardio exercise on the treadmill or stationary bicycle.  He states that when he did get back on his regular medications since January, blood pressure came down, but he still had systolics in the 160s and diastolics in the 90s.  Past Medical History:  Diagnosis Date  . CAD (coronary artery disease) 11/27/2015   a. S/p NSTEMI 6/17 >> s/p CABG // b. LHC 6/17: oLAD 50, mLAD 100, D1 70, D2 95, oLCx 95, mLCx 65, OM1 85, OM2 90, mRCA 85, dRCA 75, RPDA 40/50, RPLB2 50   . Carotid stenosis    a. Carotid US 6/17: bilat ICA 1-39%  . Essential hypertension   . History of non-ST elevation myocardial infarction (NSTEMI) 11/2015  . Hyperlipidemia     Past Surgical History:  Procedure Laterality Date  . BACK SURGERY    . CARDIAC CATHETERIZATION N/A 11/26/2015   Procedure: Left Heart Cath and Coronary Angiography;  Surgeon: Lyn RecordsHenry W Smith, MD;  [facility];  Service: Cardiovascular;  Laterality: N/A;  . CORONARY ARTERY BYPASS GRAFT N/A 11/27/2015   Procedure: CORONARY ARTERY BYPASS GRAFTING (CABG) x 5 (LIMA to LAD, SVG to DIAGONAL, SVG  SEQUENTIALLY to OM1 and OM2, SVG to PDA) with EVH from right leg using greater saphenous vein and mammary.;  Surgeon: Delight OvensEdward B Gerhardt, MD;  [facility];  Service: Open Heart Surgery;  Laterality: N/A;  . INTRAOPERATIVE TRANSESOPHAGEAL ECHOCARDIOGRAM N/A 11/27/2015   Procedure: INTRAOPERATIVE TRANSESOPHAGEAL ECHOCARDIOGRAM;  Surgeon: Delight OvensEdward B Gerhardt, MD;  [facility];  Service: Open Heart Surgery;  Laterality: N/A;  . KNEE SURGERY      Current Outpatient Medications  Medication Sig Dispense Refill  . aspirin EC 81 MG EC tablet Take 1 tablet (81 mg total) by mouth daily.    . clopidogrel (PLAVIX) 75 MG tablet Take 1 tablet (75 mg total) by mouth daily. 90 tablet 3  . lisinopril (PRINIVIL,ZESTRIL) 20 MG tablet TAKE 1 TABLET(10 MG) BY MOUTH DAILY 90 tablet 3  . metoprolol succinate (TOPROL XL) 25 MG 24 hr tablet Take 0.5 tablets (12.5 mg total) by mouth daily. 45 tablet 3  . atorvastatin (LIPITOR) 10 MG tablet Take 1 tablet (10 mg total) by mouth daily for 30 days. 90 tablet 3   No current facility-administered medications for this visit.    Allergies:  Patient has no known allergies.   Social History: The patient  reports that he has never smoked. He has never used smokeless tobacco. He reports that he does not drink alcohol or use drugs.   ROS:  Please see the history of present illness.  Otherwise, complete review of systems is positive for none.  All other systems are reviewed and negative.   Physical Exam: VS:  BP (!) 160/100 (BP Location: Right Arm, Patient Position: Sitting)   Pulse 69   Ht 6' (1.829 m)   Wt 225 lb (102.1 kg)   SpO2 98%   BMI 30.52 kg/m , BMI Body mass index is 30.52 kg/m.  Wt Readings from Last 3 Encounters:  07/16/18 225 lb (102.1 kg)  04/01/16 220 lb (99.8 kg)  12/28/15 212 lb (96.2 kg)    General: Patient appears comfortable at rest. HEENT: Conjunctiva and lids normal, oropharynx clear. Neck: Supple, no elevated JVP or carotid bruits, no  thyromegaly. Lungs: Clear to auscultation, nonlabored breathing at rest. Cardiac: Regular rate and rhythm, no S3 or significant systolic murmur, no pericardial rub. Abdomen: Soft, nontender, bowel sounds present. Extremities: No pitting edema, distal pulses 2+. Skin: Warm and dry. Musculoskeletal: No kyphosis. Neuropsychiatric: Alert and oriented x3, affect grossly appropriate.  ECG: I personally reviewed the tracing from 06/14/2018 which showed sinus rhythm with left atrial enlargement and nonspecific ST-T changes.  Recent Labwork: 06/14/2018: BUN 10; Creatinine, Ser 1.19; Hemoglobin 15.9; Platelets 345; Potassium 4.2; Sodium 138     Component Value Date/Time   CHOL 158 11/26/2015 0436   TRIG 135 11/26/2015 0436   HDL 35 (L) 11/26/2015 0436   CHOLHDL 4.5 11/26/2015 0436   VLDL 27 11/26/2015 0436   LDLCALC 96 11/26/2015 0436    Other Studies Reviewed Today:  Echocardiogram 11/28/2015: Study Conclusions  - Left ventricle: The cavity size was normal. There was mild   concentric hypertrophy. Systolic function was normal. The   estimated ejection fraction was in the range of 60% to 65%. Wall   motion was normal; there were no regional wall motion   abnormalities. Doppler parameters are consistent with abnormal   left ventricular relaxation (grade 1 diastolic dysfunction).   There was no evidence of elevated ventricular filling pressure by   Doppler parameters. - Aortic valve: Trileaflet; normal thickness leaflets. There was no   regurgitation. - Aortic root: The aortic root was normal in size. - Ascending aorta: The ascending aorta was normal in size. - Mitral valve: Structurally normal valve. There was mild   regurgitation. - Left atrium: The atrium was normal in size. - Right ventricle: The cavity size was normal. Wall thickness was   normal. Systolic function was normal. - Right atrium: The atrium was normal in size. - Tricuspid valve: There was no regurgitation. - Pulmonary  arteries: Systolic pressure was within the normal   range. - Inferior vena cava: The vessel was normal in size. - Pericardium, extracardiac: There was no pericardial effusion.  Assessment and Plan:  1.  Multivessel CAD status post CABG in 2017.  He continues on antiplatelet regimen and statin, although has had noncompliance with medications as described above.  I reinforced importance of medical therapy and follow-up.  Recent ECG and lab work reviewed.  2.  Essential hypertension, blood pressure not optimally controlled and complicated by noncompliance.  Plan to increase lisinopril to 20 mg daily and otherwise continue current dose of Toprol-XL.  Refills provided.  Follow-up BMET in 10 days with nurse visit and blood pressure check.  3.  Hyperlipidemia, on Lipitor.  We will plan follow-up LFTs and FLP around the time of his next visit.  Current medicines were reviewed with the patient today.   Orders Placed This Encounter  Procedures  . Basic Metabolic Panel (BMET)  Disposition: Follow-up in 3 months.  Signed, Jonelle Sidle, MD, [facility] 07/16/2018 1:20 PM    [facility] [address] [place], [place] [address] Phone: [phone]; Fax: [phone]

## 2018-07-26 ENCOUNTER — Ambulatory Visit: Payer: PRIVATE HEALTH INSURANCE

## 2018-07-30 ENCOUNTER — Ambulatory Visit: Payer: PRIVATE HEALTH INSURANCE

## 2018-08-08 ENCOUNTER — Other Ambulatory Visit (HOSPITAL_COMMUNITY)
Admission: RE | Admit: 2018-08-08 | Discharge: 2018-08-08 | Disposition: A | Discharge status: Home / Self Care | Payer: Self-pay | Source: Ambulatory Visit | Attending: Cardiology | Admitting: Cardiology

## 2018-08-08 ENCOUNTER — Ambulatory Visit (INDEPENDENT_AMBULATORY_CARE_PROVIDER_SITE_OTHER): Payer: Self-pay

## 2018-08-08 VITALS — BP 132/84 | HR 62 | Ht 70.0 in | Wt 220.0 lb

## 2018-08-08 DIAGNOSIS — I10 Essential (primary) hypertension: Secondary | ICD-10-CM

## 2018-08-08 DIAGNOSIS — Z79899 Other long term (current) drug therapy: Secondary | ICD-10-CM | POA: Insufficient documentation

## 2018-08-08 LAB — BASIC METABOLIC PANEL
Anion gap: 8 (ref 5–15)
BUN: 19 mg/dL (ref 6–20)
CO2: 24 mmol/L (ref 22–32)
Calcium: 8.9 mg/dL (ref 8.9–10.3)
Chloride: 104 mmol/L (ref 98–111)
Creatinine, Ser: 1.1 mg/dL (ref 0.61–1.24)
GFR calc Af Amer: >60 mL/min (ref >60)
GFR calc non Af Amer: >60 mL/min (ref >60)
Glucose, Bld: 83 mg/dL (ref 70–99)
Potassium: 3.9 mmol/L (ref 3.5–5.1)
Sodium: 136 mmol/L (ref 135–145)

## 2018-08-08 NOTE — Progress Notes (Signed)
Pt came in for blood pressure check. He stated he is not having any issues with medication increase. He did go get labs done just before nurse appt.

## 2018-08-09 ENCOUNTER — Telehealth: Payer: Self-pay

## 2018-08-09 NOTE — Telephone Encounter (Signed)
Called pt. No answer. Left message for pt to return call.  

## 2018-08-09 NOTE — Progress Notes (Signed)
Blood pressure measurement is much better following increase in lisinopril.  I reviewed his lab work which shows normal renal function and potassium.  Continue same for now.

## 2018-08-09 NOTE — Telephone Encounter (Signed)
-----   Message from Samuel G McDowell, MD sent at 08/09/2018  8:07 AM EST -----   ----- Message ----- From: Everest Hacking R, CMA Sent: 08/08/2018   4:34 PM EST To: Samuel G McDowell, MD  

## 2018-08-09 NOTE — Telephone Encounter (Signed)
-----   Message from Jonelle Sidle, MD sent at 08/09/2018  8:07 AM EST -----   ----- Message ----- From: Fonnie Birkenhead, CMA Sent: 08/08/2018   4:34 PM EST To: Jonelle Sidle, MD

## 2018-08-09 NOTE — Telephone Encounter (Signed)
Pt made aware

## 2018-10-15 ENCOUNTER — Ambulatory Visit: Payer: PRIVATE HEALTH INSURANCE | Admitting: Cardiology

## 2018-12-12 ENCOUNTER — Ambulatory Visit: Payer: Self-pay | Admitting: Cardiology

## 2018-12-12 ENCOUNTER — Encounter: Payer: Self-pay | Admitting: Cardiology

## 2018-12-12 NOTE — Progress Notes (Deleted)
Cardiology Office Note  Date: 12/12/2018   ID: Justin Villa, DOB 01/10/1966, MRN [id number]  PCP:  Kari BaarsHawkins, Edward, MD  Cardiologist:  Nona DellSamuel McDowell, MD Electrophysiologist:  None   No chief complaint on file.   History of Present Illness: Justin Villa is a 53 y.o. male last seen in February 2020.  Past Medical History:  Diagnosis Date  . CAD (coronary artery disease) 11/27/2015   a. S/p NSTEMI 6/17 >> s/p CABG // b. LHC 6/17: oLAD 50, mLAD 100, D1 70, D2 95, oLCx 95, mLCx 65, OM1 85, OM2 90, mRCA 85, dRCA 75, RPDA 40/50, RPLB2 50   . Carotid stenosis    a. Carotid US 6/17: bilat ICA 1-39%  . Essential hypertension   . History of non-ST elevation myocardial infarction (NSTEMI) 11/2015  . Hyperlipidemia     Past Surgical History:  Procedure Laterality Date  . BACK SURGERY    . CARDIAC CATHETERIZATION N/A 11/26/2015   Procedure: Left Heart Cath and Coronary Angiography;  Surgeon: Lyn RecordsHenry W Smith, MD;  [facility];  Service: Cardiovascular;  Laterality: N/A;  . CORONARY ARTERY BYPASS GRAFT N/A 11/27/2015   Procedure: CORONARY ARTERY BYPASS GRAFTING (CABG) x 5 (LIMA to LAD, SVG to DIAGONAL, SVG SEQUENTIALLY to OM1 and OM2, SVG to PDA) with EVH from right leg using greater saphenous vein and mammary.;  Surgeon: Delight OvensEdward B Gerhardt, MD;  [facility];  Service: Open Heart Surgery;  Laterality: N/A;  . INTRAOPERATIVE TRANSESOPHAGEAL ECHOCARDIOGRAM N/A 11/27/2015   Procedure: INTRAOPERATIVE TRANSESOPHAGEAL ECHOCARDIOGRAM;  Surgeon: Delight OvensEdward B Gerhardt, MD;  [facility];  Service: Open Heart Surgery;  Laterality: N/A;  . KNEE SURGERY      Current Outpatient Medications  Medication Sig Dispense Refill  . aspirin EC 81 MG EC tablet Take 1 tablet (81 mg total) by mouth daily.    Marland Kitchen. atorvastatin (LIPITOR) 10 MG tablet Take 1 tablet (10 mg total) by mouth daily for 30 days. 90 tablet 3  . clopidogrel (PLAVIX) 75 MG tablet Take 1 tablet (75 mg total) by mouth daily.  90 tablet 3  . lisinopril (PRINIVIL,ZESTRIL) 20 MG tablet TAKE 1 TABLET(10 MG) BY MOUTH DAILY 90 tablet 3  . metoprolol succinate (TOPROL XL) 25 MG 24 hr tablet Take 0.5 tablets (12.5 mg total) by mouth daily. 45 tablet 3   No current facility-administered medications for this visit.    Allergies:  Patient has no known allergies.   Social History: The patient  reports that he has never smoked. He has never used smokeless tobacco. He reports that he does not drink alcohol or use drugs.   Family History: The patient's family history includes Heart disease in his father.   ROS:  Please see the history of present illness. Otherwise, complete review of systems is positive for {NONE DEFAULTED:18576::"none"}.  All other systems are reviewed and negative.   Physical Exam: VS:  There were no vitals taken for this visit., BMI There is no height or weight on file to calculate BMI.  Wt Readings from Last 3 Encounters:  08/08/18 220 lb (99.8 kg)  07/16/18 225 lb (102.1 kg)  04/01/16 220 lb (99.8 kg)    General: Patient appears comfortable at rest. HEENT: Conjunctiva and lids normal, oropharynx clear with moist mucosa. Neck: Supple, no elevated JVP or carotid bruits, no thyromegaly. Lungs: Clear to auscultation, nonlabored breathing at rest. Cardiac: Regular rate and rhythm, no S3 or significant systolic murmur, no pericardial rub. Abdomen: Soft,  nontender, no hepatomegaly, bowel sounds present, no guarding or rebound. Extremities: No pitting edema, distal pulses 2+. Skin: Warm and dry. Musculoskeletal: No kyphosis. Neuropsychiatric: Alert and oriented x3, affect grossly appropriate.  ECG:  An ECG dated 06/14/2018 was personally reviewed today and demonstrated:  Sinus rhythm with left atrial enlargement and nonspecific ST-T changes.  Recent Labwork: 06/14/2018: Hemoglobin 15.9; Platelets 345 08/08/2018: BUN 19; Creatinine, Ser 1.10; Potassium 3.9; Sodium 136     Component Value Date/Time   CHOL  158 11/26/2015 0436   TRIG 135 11/26/2015 0436   HDL 35 (L) 11/26/2015 0436   CHOLHDL 4.5 11/26/2015 0436   VLDL 27 11/26/2015 0436   LDLCALC 96 11/26/2015 0436    Other Studies Reviewed Today:  Echocardiogram 11/28/2015: Study Conclusions  - Left ventricle: The cavity size was normal. There was mild concentric hypertrophy. Systolic function was normal. The estimated ejection fraction was in the range of 60% to 65%. Wall motion was normal; there were no regional wall motion abnormalities. Doppler parameters are consistent with abnormal left ventricular relaxation (grade 1 diastolic dysfunction). There was no evidence of elevated ventricular filling pressure by Doppler parameters. - Aortic valve: Trileaflet; normal thickness leaflets. There was no regurgitation. - Aortic root: The aortic root was normal in size. - Ascending aorta: The ascending aorta was normal in size. - Mitral valve: Structurally normal valve. There was mild regurgitation. - Left atrium: The atrium was normal in size. - Right ventricle: The cavity size was normal. Wall thickness was normal. Systolic function was normal. - Right atrium: The atrium was normal in size. - Tricuspid valve: There was no regurgitation. - Pulmonary arteries: Systolic pressure was within the normal range. - Inferior vena cava: The vessel was normal in size. - Pericardium, extracardiac: There was no pericardial effusion.  Assessment and Plan:   Medication Adjustments/Labs and Tests Ordered: Current medicines are reviewed at length with the patient today.  Concerns regarding medicines are outlined above.   Tests Ordered: No orders of the defined types were placed in this encounter.   Medication Changes: No orders of the defined types were placed in this encounter.   Disposition:  Follow up {follow up:15908}  Signed, Satira Sark, MD, [facility] 12/12/2018 11:55 AM    [facility]  [facility] at [place]. [address] [place], [place] [address] Phone: [phone]; Fax: [phone]

## 2019-10-01 ENCOUNTER — Other Ambulatory Visit: Payer: Self-pay

## 2019-10-01 MED ORDER — ATORVASTATIN CALCIUM 10 MG PO TABS: 10.0000 mg | ORAL_TABLET | Freq: Every day | ORAL | 0 refills | Status: DC

## 2019-10-01 MED ORDER — CLOPIDOGREL BISULFATE 75 MG PO TABS: 75.0000 mg | ORAL_TABLET | Freq: Every day | ORAL | 0 refills | Status: DC

## 2019-10-01 MED ORDER — METOPROLOL SUCCINATE ER 25 MG PO TB24: 12.5000 mg | ORAL_TABLET | Freq: Every day | ORAL | 0 refills | Status: DC

## 2019-10-01 MED ORDER — LISINOPRIL 20 MG PO TABS: ORAL_TABLET | ORAL | 0 refills | Status: DC

## 2019-10-01 NOTE — Telephone Encounter (Signed)
Pt had no showed and cancelled last 2 visits.Will give 30 day supply and refill after May visit

## 2019-10-01 NOTE — Telephone Encounter (Signed)
Pt states [facility] instructed him to call office for refills. (All meds)   Thanks renee

## 2019-10-09 NOTE — Progress Notes (Signed)
Cardiology Office Note    Date:  10/21/2019   ID:  Justin Villa, DOB 13-Jul-1965, MRN [id number]  PCP:  Patient, No Pcp Per  Cardiologist: Rozann Lesches, MD EPS: None  Chief Complaint  Patient presents with  . Follow-up  . Shortness of Breath    History of Present Illness:  Justin Villa is a 54 y.o. male with history of CAD status post NSTEMI and CABG in 2017, hypertension, hyperlipidemia.  Was in the ER 06/2018 with elevated blood pressures and chest pain after stopping his medications.  Patient saw Dr. Domenic Polite 07/16/2018 and medications were resumed and lisinopril increased for elevated blood pressure.  Labs stable.  Patient comes in for f/u. Denies chest pain, palpitations, dyspnea, dizziness or presyncope. 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 He's noticed it in the past 6 months. Similar to when he had his MI but was much worse then. Ran out of meds for 6 days 2 weeks ago but back on. Working 3 jobs. BP up today. Ate a sausage biscuit this am.  Past Medical History:  Diagnosis Date  . CAD (coronary artery disease) 11/27/2015   a. S/p NSTEMI 6/17 >> s/p CABG // b. LHC 6/17: oLAD 50, mLAD 100, D1 70, D2 95, oLCx 95, mLCx 65, OM1 85, OM2 90, mRCA 85, dRCA 75, RPDA 40/50, RPLB2 50   . Carotid stenosis    a. Carotid US 6/17: bilat ICA 1-39%  . Essential hypertension   . History of non-ST elevation myocardial infarction (NSTEMI) 11/2015  . Hyperlipidemia     Past Surgical History:  Procedure Laterality Date  . BACK SURGERY    . CARDIAC CATHETERIZATION N/A 11/26/2015   Procedure: Left Heart Cath and Coronary Angiography;  Surgeon: Belva Crome, MD;  Location: [place] [facility];  Service: Cardiovascular;  Laterality: N/A;  . CORONARY ARTERY BYPASS GRAFT N/A 11/27/2015   Procedure: CORONARY ARTERY BYPASS GRAFTING (CABG) x 5 (LIMA to LAD, SVG to DIAGONAL, SVG SEQUENTIALLY to OM1 and OM2, SVG to PDA) with EVH from right leg using greater saphenous vein and mammary.;  Surgeon: Grace Isaac, MD;  Location: [place];  Service: Open Heart Surgery;  Laterality: N/A;  . INTRAOPERATIVE TRANSESOPHAGEAL ECHOCARDIOGRAM N/A 11/27/2015   Procedure: INTRAOPERATIVE TRANSESOPHAGEAL ECHOCARDIOGRAM;  Surgeon: Grace Isaac, MD;  Location: [place];  Service: Open Heart Surgery;  Laterality: N/A;  . KNEE SURGERY      Current Medications: Current Meds  Medication Sig  . aspirin EC 81 MG EC tablet Take 1 tablet (81 mg total) by mouth daily.  Marland Kitchen atorvastatin (LIPITOR) 10 MG tablet Take 1 tablet (10 mg total) by mouth daily.  . clopidogrel (PLAVIX) 75 MG tablet Take 1 tablet (75 mg total) by mouth daily.  . metoprolol succinate (TOPROL XL) 25 MG 24 hr tablet Take 0.5 tablets (12.5 mg total) by mouth daily.  . [DISCONTINUED] lisinopril (ZESTRIL) 20 MG tablet TAKE 1 TABLET(10 MG) BY MOUTH DAILY     Allergies:   Patient has no known allergies.   Social History   Socioeconomic History  . Marital status: Single    Spouse name: Not on file  . Number of children: Not on file  . Years of education: Not on file  . Highest education  level: Not on file  Occupational History  . Not on file  Tobacco Use  . Smoking status: Never Smoker  . Smokeless tobacco: Never Used  Substance and Sexual Activity  . Alcohol use: No  . Drug use: No  . Sexual activity: Not on file  Other Topics Concern  . Not on file  Social History Narrative  . Not on file   Social Determinants of Health   Financial Resource Strain:   . Difficulty of Paying Living Expenses:   Food Insecurity:   . Worried About Programme researcher, broadcasting/film/video in the Last Year:   . Barista in the Last Year:   Transportation Needs:   . Freight forwarder (Medical):   Marland Kitchen Lack of Transportation (Non-Medical):   Physical  Activity:   . Days of Exercise per Week:   . Minutes of Exercise per Session:   Stress:   . Feeling of Stress :   Social Connections:   . Frequency of Communication with Friends and Family:   . Frequency of Social Gatherings with Friends and Family:   . Attends Religious Services:   . Active Member of Clubs or Organizations:   . Attends Banker Meetings:   Marland Kitchen Marital Status:      Family History:  The patient's   family history includes Heart disease in his father.   ROS:   Please see the history of present illness.    ROS All other systems reviewed and are negative.   PHYSICAL EXAM:   VS:  BP 132/90   Pulse 76   Ht 5\' 10"  (1.778 m)   Wt 229 lb (103.9 kg)   SpO2 96%   BMI 32.86 kg/m   Physical Exam  , in no acute distress  Neck: no JVD, carotid bruits, or masses Cardiac:RRR; S4 no murmurs, rubs  Respiratory:  clear to auscultation bilaterally, normal work of breathing GI: soft, nontender, nondistended, + BS Ext: without cyanosis, clubbing, or edema, Good distal pulses bilaterally. Positive tinel sign left wrist  Neuro:  Alert and Oriented x 3 Psych: euthymic mood, full affect  Wt Readings from Last 3 Encounters:  10/21/19 229 lb (103.9 kg)  08/08/18 220 lb (99.8 kg)  07/16/18 225 lb (102.1 kg)      Studies/Labs Reviewed:   EKG:  EKG is  ordered today.  The ekg ordered today demonstrates normal sinus rhythm, left axis, no acute change  Recent Labs: No results found for requested labs within last 8760 hours.   Lipid Panel    Component Value Date/Time   CHOL 158 11/26/2015 0436   TRIG 135 11/26/2015 0436   HDL 35 (L) 11/26/2015 0436   CHOLHDL 4.5 11/26/2015 0436   VLDL 27 11/26/2015 0436   LDLCALC 96 11/26/2015 0436    Additional studies/ records that were reviewed today include:  Echocardiogram 11/28/2015: Study Conclusions   - Left ventricle: The cavity size was normal. There was mild   concentric hypertrophy. Systolic function  was normal. The   estimated ejection fraction was in the range of 60% to 65%. Wall   motion was normal; there were no regional wall motion   abnormalities. Doppler parameters are consistent with abnormal   left ventricular relaxation (grade 1 diastolic dysfunction).   There was no evidence of elevated ventricular filling pressure by   Doppler parameters. - Aortic valve: Trileaflet; normal thickness leaflets. There was no   regurgitation. - Aortic root: The aortic root was normal in size. -  Ascending aorta: The ascending aorta was normal in size. - Mitral valve: Structurally normal valve. There was mild   regurgitation. - Left atrium: The atrium was normal in size. - Right ventricle: The cavity size was normal. Wall thickness was   normal. Systolic function was normal. - Right atrium: The atrium was normal in size. - Tricuspid valve: There was no regurgitation. - Pulmonary arteries: Systolic pressure was within the normal   range. - Inferior vena cava: The vessel was normal in size. - Pericardium, extracardiac: There was no pericardial effusion.       ASSESSMENT:    1. Coronary artery disease involving coronary bypass graft of native heart without angina pectoris   2. Essential hypertension   3. Hyperlipidemia, unspecified hyperlipidemia type   4. Carpal tunnel syndrome of left wrist   5. Medication management   6. DOE (dyspnea on exertion)      PLAN:  In order of problems listed above:    CAD status post CABG in 2017.  He ran out of all meds 2 weeks ago for about 6 days but is back on Plavix, aspirin, atorvastatin, beta-blocker and lisinopril.  6 months of dyspnea on exertion when he is in a hurry or rushing.  This is similar to when he had his MI.  He is able to walk on the treadmill without trouble at a flat, slow rate. Will order GXT myoview.     Essential hypertension, blood pressure up today.  He ran out of his medications 2 weeks ago but has been taking since then.   Check labs tomorrow and increase lisinopril to 40 mg once daily. 2 gram sodium diet     Hyperlipidemia, on Lipitor.    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 Will refer to hand surgeon for w/u. Also refer to PCP.  Medication Adjustments/Labs and Tests Ordered: Current medicines are reviewed at length with the patient today.  Concerns regarding medicines are outlined above.  Medication changes, Labs and Tests ordered today are listed in the Patient Instructions below. Patient Instructions  Medication Instructions:  Your physician has recommended you make the following change in your medication:  Lisinopril 40 mg Daily   *If you need a refill on your cardiac medications before your next appointment, please call your pharmacy*   Lab Work: Your physician recommends that you return for lab work in: Fasting   If you have labs (blood work) drawn today and your tests are completely normal, you will receive your results only by: Marland Kitchen MyChart Message (if you have MyChart) OR . A paper copy in the mail If you have any lab test that is abnormal or we need to change your treatment, we will call you to review the results.   Testing/Procedures: Your physician discussed the importance of regular exercise and recommended that you start or continue a regular exercise program for good health.     Follow-Up: At [facility], you and your health needs are our priority.  As part of our continuing mission to provide you with exceptional heart care, we have created designated Provider Care Teams.  These Care Teams include your primary Cardiologist (physician) and Advanced Practice Providers (APPs -  Physician Assistants and Nurse Practitioners) who all work together to provide you with the care you need, when you need it.  We recommend signing up for the patient portal called "MyChart".  Sign up  information is provided on this After Visit Summary.  MyChart is used to connect with patients for Virtual Visits (Telemedicine).  Patients are able to view lab/test results, encounter notes, upcoming appointments, etc.  Non-urgent messages can be sent to your provider as well.   To learn more about what you can do with MyChart, go to [email or web address].    Your next appointment:   3 week(s)  The format for your next appointment:   In Person  Provider:   Nona Dell, MD   Other Instructions Thank you for choosing [facility]!       Elson Clan, PA-C  10/21/2019 3:07 PM    [facility] [address] [place], [place], [place]  [address] Phone: [phone]; Fax: [phone]

## 2019-10-21 ENCOUNTER — Encounter: Payer: Self-pay | Admitting: *Deleted

## 2019-10-21 ENCOUNTER — Other Ambulatory Visit: Payer: Self-pay

## 2019-10-21 ENCOUNTER — Encounter: Payer: Self-pay | Admitting: Physician Assistant

## 2019-10-21 ENCOUNTER — Ambulatory Visit (INDEPENDENT_AMBULATORY_CARE_PROVIDER_SITE_OTHER): Payer: 59 | Admitting: Physician Assistant

## 2019-10-21 VITALS — BP 132/90 | HR 76 | Ht 70.0 in | Wt 229.0 lb

## 2019-10-21 DIAGNOSIS — E785 Hyperlipidemia, unspecified: Secondary | ICD-10-CM

## 2019-10-21 DIAGNOSIS — I25810 Atherosclerosis of coronary artery bypass graft(s) without angina pectoris: Secondary | ICD-10-CM | POA: Diagnosis not present

## 2019-10-21 DIAGNOSIS — R0600 Dyspnea, unspecified: Secondary | ICD-10-CM

## 2019-10-21 DIAGNOSIS — G5602 Carpal tunnel syndrome, left upper limb: Secondary | ICD-10-CM

## 2019-10-21 DIAGNOSIS — Z79899 Other long term (current) drug therapy: Secondary | ICD-10-CM

## 2019-10-21 DIAGNOSIS — R0609 Other forms of dyspnea: Secondary | ICD-10-CM

## 2019-10-21 DIAGNOSIS — I10 Essential (primary) hypertension: Secondary | ICD-10-CM

## 2019-10-21 MED ORDER — LISINOPRIL 40 MG PO TABS: 40.0000 mg | ORAL_TABLET | Freq: Every day | ORAL | 3 refills | Status: DC

## 2019-10-21 NOTE — Patient Instructions (Signed)
Medication Instructions:  Your physician has recommended you make the following change in your medication:  Lisinopril 40 mg Daily   *If you need a refill on your cardiac medications before your next appointment, please call your pharmacy*   Lab Work: Your physician recommends that you return for lab work in: Fasting   If you have labs (blood work) drawn today and your tests are completely normal, you will receive your results only by: Marland Kitchen MyChart Message (if you have MyChart) OR . A paper copy in the mail If you have any lab test that is abnormal or we need to change your treatment, we will call you to review the results.   Testing/Procedures: Your physician discussed the importance of regular exercise and recommended that you start or continue a regular exercise program for good health.     Follow-Up: At [facility], you and your health needs are our priority.  As part of our continuing mission to provide you with exceptional heart care, we have created designated Provider Care Teams.  These Care Teams include your primary Cardiologist (physician) and Advanced Practice Providers (APPs -  Physician Assistants and Nurse Practitioners) who all work together to provide you with the care you need, when you need it.  We recommend signing up for the patient portal called "MyChart".  Sign up information is provided on this After Visit Summary.  MyChart is used to connect with patients for Virtual Visits (Telemedicine).  Patients are able to view lab/test results, encounter notes, upcoming appointments, etc.  Non-urgent messages can be sent to your provider as well.   To learn more about what you can do with MyChart, go to [email or web address].    Your next appointment:   3 week(s)  The format for your next appointment:   In Person  Provider:   Nona Dell, MD   Other Instructions Thank you for choosing [facility]!

## 2019-10-22 ENCOUNTER — Other Ambulatory Visit (HOSPITAL_COMMUNITY)
Admission: RE | Admit: 2019-10-22 | Discharge: 2019-10-22 | Disposition: A | Discharge status: Home / Self Care | Payer: 59 | Source: Ambulatory Visit | Attending: Physician Assistant | Admitting: Physician Assistant

## 2019-10-22 DIAGNOSIS — Z79899 Other long term (current) drug therapy: Secondary | ICD-10-CM | POA: Diagnosis present

## 2019-10-22 DIAGNOSIS — I10 Essential (primary) hypertension: Secondary | ICD-10-CM | POA: Insufficient documentation

## 2019-10-22 DIAGNOSIS — I25810 Atherosclerosis of coronary artery bypass graft(s) without angina pectoris: Secondary | ICD-10-CM | POA: Insufficient documentation

## 2019-10-22 DIAGNOSIS — E785 Hyperlipidemia, unspecified: Secondary | ICD-10-CM | POA: Insufficient documentation

## 2019-10-22 LAB — COMPREHENSIVE METABOLIC PANEL
ALT: 44 U/L (ref 0–44)
AST: 43 U/L — ABNORMAL HIGH (ref 15–41)
Albumin: 4 g/dL (ref 3.5–5.0)
Alkaline Phosphatase: 36 U/L — ABNORMAL LOW (ref 38–126)
Anion gap: 11 (ref 5–15)
BUN: 15 mg/dL (ref 6–20)
CO2: 26 mmol/L (ref 22–32)
Calcium: 8.7 mg/dL — ABNORMAL LOW (ref 8.9–10.3)
Chloride: 102 mmol/L (ref 98–111)
Creatinine, Ser: 1.11 mg/dL (ref 0.61–1.24)
GFR calc Af Amer: >60 mL/min (ref >60)
GFR calc non Af Amer: >60 mL/min (ref >60)
Glucose, Bld: 94 mg/dL (ref 70–99)
Potassium: 4.9 mmol/L (ref 3.5–5.1)
Sodium: 139 mmol/L (ref 135–145)
Total Bilirubin: 2 mg/dL — ABNORMAL HIGH (ref 0.3–1.2)
Total Protein: 6.8 g/dL (ref 6.5–8.1)

## 2019-10-22 LAB — LIPID PANEL
Cholesterol: 150 mg/dL (ref 0–200)
HDL: 28 mg/dL — ABNORMAL LOW (ref >40)
LDL Cholesterol: 90 mg/dL (ref 0–99)
Total CHOL/HDL Ratio: 5.4 RATIO
Triglycerides: 159 mg/dL — ABNORMAL HIGH (ref <150)
VLDL: 32 mg/dL (ref 0–40)

## 2019-10-22 LAB — CBC
HCT: 54 % — ABNORMAL HIGH (ref 39.0–52.0)
Hemoglobin: 17.2 g/dL — ABNORMAL HIGH (ref 13.0–17.0)
MCH: 27 pg (ref 26.0–34.0)
MCHC: 31.9 g/dL (ref 30.0–36.0)
MCV: 84.8 fL (ref 80.0–100.0)
Platelets: 268 10*3/uL (ref 150–400)
RBC: 6.37 MIL/uL — ABNORMAL HIGH (ref 4.22–5.81)
RDW: 15.6 % — ABNORMAL HIGH (ref 11.5–15.5)
WBC: 6.6 10*3/uL (ref 4.0–10.5)
nRBC: 0 % (ref 0.0–0.2)

## 2019-10-22 LAB — TSH: TSH: 2.499 u[IU]/mL (ref 0.350–4.500)

## 2019-10-30 ENCOUNTER — Other Ambulatory Visit: Payer: Self-pay

## 2019-10-30 MED ORDER — METOPROLOL SUCCINATE ER 25 MG PO TB24: 12.5000 mg | ORAL_TABLET | Freq: Every day | ORAL | 6 refills | Status: DC

## 2019-10-30 MED ORDER — ATORVASTATIN CALCIUM 10 MG PO TABS: 10.0000 mg | ORAL_TABLET | Freq: Every day | ORAL | 6 refills | Status: DC

## 2019-10-30 MED ORDER — CLOPIDOGREL BISULFATE 75 MG PO TABS: 75.0000 mg | ORAL_TABLET | Freq: Every day | ORAL | 6 refills | Status: DC

## 2019-10-30 NOTE — Telephone Encounter (Signed)
Refilled lipitor, toprol and plavix

## 2019-11-01 ENCOUNTER — Other Ambulatory Visit (HOSPITAL_COMMUNITY): Payer: 59

## 2019-11-04 ENCOUNTER — Encounter (HOSPITAL_COMMUNITY): Payer: 59

## 2019-11-05 ENCOUNTER — Telehealth: Payer: Self-pay | Admitting: *Deleted

## 2019-11-05 DIAGNOSIS — E782 Mixed hyperlipidemia: Secondary | ICD-10-CM

## 2019-11-05 MED ORDER — ATORVASTATIN CALCIUM 20 MG PO TABS: 20.0000 mg | ORAL_TABLET | Freq: Every day | ORAL | 3 refills | Status: DC

## 2019-11-05 NOTE — Telephone Encounter (Signed)
-----   Message from Dyann Kief, PA-C sent at 10/22/2019 11:13 AM EDT ----- LSL 90 goal is less than 70. Increase lipitor 20 mg daily. Bilirubin high. Ask if he's having any stomach or gallbladder problems. Make sure he has appt with PCP(we referred him yesterday. Calcium low. Recommend he take calcium supplement-could be in a multi vitamin.repeat FLP and LFT's in 2 months. Thyroid normal

## 2019-11-05 NOTE — Telephone Encounter (Signed)
Pt.notified

## 2019-11-08 ENCOUNTER — Other Ambulatory Visit: Payer: Self-pay

## 2019-11-08 ENCOUNTER — Other Ambulatory Visit (HOSPITAL_COMMUNITY)
Admission: RE | Admit: 2019-11-08 | Discharge: 2019-11-08 | Disposition: A | Discharge status: Home / Self Care | Payer: 59 | Source: Ambulatory Visit | Attending: Physician Assistant | Admitting: Physician Assistant

## 2019-11-08 DIAGNOSIS — Z01812 Encounter for preprocedural laboratory examination: Secondary | ICD-10-CM | POA: Insufficient documentation

## 2019-11-08 DIAGNOSIS — Z20822 Contact with and (suspected) exposure to covid-19: Secondary | ICD-10-CM | POA: Insufficient documentation

## 2019-11-09 LAB — SARS CORONAVIRUS 2 (TAT 6-24 HRS): SARS Coronavirus 2: NEGATIVE

## 2019-11-11 ENCOUNTER — Encounter (HOSPITAL_COMMUNITY): Admission: RE | Admit: 2019-11-11 | Payer: 59 | Source: Ambulatory Visit

## 2019-11-11 ENCOUNTER — Other Ambulatory Visit: Payer: Self-pay

## 2019-11-12 ENCOUNTER — Ambulatory Visit: Payer: 59 | Admitting: Cardiology

## 2019-11-22 ENCOUNTER — Other Ambulatory Visit: Payer: Self-pay

## 2019-11-22 ENCOUNTER — Other Ambulatory Visit (HOSPITAL_COMMUNITY)
Admission: RE | Admit: 2019-11-22 | Discharge: 2019-11-22 | Disposition: A | Discharge status: Home / Self Care | Payer: 59 | Source: Ambulatory Visit | Attending: Physician Assistant | Admitting: Physician Assistant

## 2019-11-22 DIAGNOSIS — Z01812 Encounter for preprocedural laboratory examination: Secondary | ICD-10-CM | POA: Diagnosis present

## 2019-11-22 DIAGNOSIS — Z20822 Contact with and (suspected) exposure to covid-19: Secondary | ICD-10-CM | POA: Insufficient documentation

## 2019-11-22 LAB — SARS CORONAVIRUS 2 (TAT 6-24 HRS): SARS Coronavirus 2: NEGATIVE

## 2019-11-25 ENCOUNTER — Ambulatory Visit (HOSPITAL_COMMUNITY)
Admission: RE | Admit: 2019-11-25 | Discharge: 2019-11-25 | Disposition: A | Discharge status: Home / Self Care | Payer: 59 | Source: Ambulatory Visit | Attending: Physician Assistant | Admitting: Physician Assistant

## 2019-11-25 ENCOUNTER — Encounter (HOSPITAL_COMMUNITY)
Admission: RE | Admit: 2019-11-25 | Discharge: 2019-11-25 | Disposition: A | Discharge status: Home / Self Care | Payer: 59 | Source: Ambulatory Visit | Attending: Physician Assistant | Admitting: Physician Assistant

## 2019-11-25 ENCOUNTER — Encounter (HOSPITAL_COMMUNITY): Payer: Self-pay

## 2019-11-25 ENCOUNTER — Other Ambulatory Visit: Payer: Self-pay

## 2019-11-25 DIAGNOSIS — R0600 Dyspnea, unspecified: Secondary | ICD-10-CM | POA: Insufficient documentation

## 2019-11-25 LAB — NM MYOCAR MULTI W/SPECT W/WALL MOTION / EF
Estimated workload: 10.1 METS
Exercise duration (min): 9 min
Exercise duration (sec): 32 s
LV dias vol: 102 mL (ref 62–150)
LV sys vol: 38 mL
MPHR: 164 {beats}/min
Peak HR: 146 {beats}/min
Percent HR: 89 %
RATE: 0.38
RPE: 114
Rest HR: 68 {beats}/min
SDS: 0
SRS: 2
SSS: 2
TID: 0.91

## 2019-11-25 MED ORDER — SODIUM CHLORIDE FLUSH 0.9 % IV SOLN
INTRAVENOUS | Status: AC
  Administered 2019-11-25: 10 mL via INTRAVENOUS
  Filled 2019-11-25: qty 10

## 2019-11-25 MED ORDER — TECHNETIUM TC 99M TETROFOSMIN IV KIT
10.0000 | PACK | Freq: Once | INTRAVENOUS | Status: AC | PRN
  Administered 2019-11-25: 9.85 via INTRAVENOUS

## 2019-11-25 MED ORDER — TECHNETIUM TC 99M TETROFOSMIN IV KIT
30.0000 | PACK | Freq: Once | INTRAVENOUS | Status: AC | PRN
  Administered 2019-11-25: 29 via INTRAVENOUS

## 2019-11-25 MED ORDER — REGADENOSON 0.4 MG/5ML IV SOLN
INTRAVENOUS | Status: AC
  Filled 2019-11-25: qty 5

## 2019-11-26 ENCOUNTER — Other Ambulatory Visit: Payer: Self-pay

## 2019-11-26 MED ORDER — METOPROLOL SUCCINATE ER 25 MG PO TB24: 25.0000 mg | ORAL_TABLET | Freq: Every day | ORAL | 0 refills | Status: DC

## 2019-12-11 ENCOUNTER — Ambulatory Visit (INDEPENDENT_AMBULATORY_CARE_PROVIDER_SITE_OTHER): Payer: 59 | Admitting: Cardiology

## 2019-12-11 ENCOUNTER — Other Ambulatory Visit: Payer: Self-pay

## 2019-12-11 ENCOUNTER — Encounter: Payer: Self-pay | Admitting: Cardiology

## 2019-12-11 VITALS — BP 124/84 | HR 88 | Ht 72.0 in | Wt 227.0 lb

## 2019-12-11 DIAGNOSIS — E782 Mixed hyperlipidemia: Secondary | ICD-10-CM | POA: Diagnosis not present

## 2019-12-11 DIAGNOSIS — I25119 Atherosclerotic heart disease of native coronary artery with unspecified angina pectoris: Secondary | ICD-10-CM

## 2019-12-11 NOTE — Progress Notes (Signed)
Cardiology Office Note  Date: 12/11/2019   ID: Justin Villa, DOB 1966-01-30, MRN [id number]  PCP:  Patient, No Pcp Per  Cardiologist:  Nona Dell, MD Electrophysiologist:  None   Chief Complaint  Patient presents with  . Cardiac follow-up    History of Present Illness: Justin Villa is a 54 y.o. male last seen by Ms. Lilla Shook in May.  He presents for a follow-up visit.  States that he is doing well.  Exercising every day, gets on the treadmill for 30 minutes and then does his weight workouts.  He does not report any active angina at this time.  Follow-up exercise Myoview in June was low risk as outlined below.  I reviewed the results.  He reports compliance with his medications as outlined below.  Blood pressure is well controlled today.  We will plan to repeat a lipid panel for his next visit.  Past Medical History:  Diagnosis Date  . CAD (coronary artery disease) 11/27/2015   a. S/p NSTEMI 6/17 >> s/p CABG // b. LHC 6/17: oLAD 50, mLAD 100, D1 70, D2 95, oLCx 95, mLCx 65, OM1 85, OM2 90, mRCA 85, dRCA 75, RPDA 40/50, RPLB2 50   . Carotid stenosis    a. Carotid US 6/17: bilat ICA 1-39%  . Essential hypertension   . History of non-ST elevation myocardial infarction (NSTEMI) 11/2015  . Hyperlipidemia     Past Surgical History:  Procedure Laterality Date  . BACK SURGERY    . CARDIAC CATHETERIZATION N/A 11/26/2015   Procedure: Left Heart Cath and Coronary Angiography;  Surgeon: Lyn Records, MD;  Location: [facility];  Service: Cardiovascular;  Laterality: N/A;  . CORONARY ARTERY BYPASS GRAFT N/A 11/27/2015   Procedure: CORONARY ARTERY BYPASS GRAFTING (CABG) x 5 (LIMA to LAD, SVG to DIAGONAL, SVG SEQUENTIALLY to OM1 and OM2, SVG to PDA) with EVH from right leg using greater saphenous vein and mammary.;  Surgeon: Delight Ovens, MD;  Location: [facility];  Service: Open Heart Surgery;  Laterality: N/A;  . INTRAOPERATIVE TRANSESOPHAGEAL ECHOCARDIOGRAM N/A 11/27/2015    Procedure: INTRAOPERATIVE TRANSESOPHAGEAL ECHOCARDIOGRAM;  Surgeon: Delight Ovens, MD;  Location: [facility] OR;  Service: Open Heart Surgery;  Laterality: N/A;  . KNEE SURGERY      Current Outpatient Medications  Medication Sig Dispense Refill  . aspirin EC 81 MG EC tablet Take 1 tablet (81 mg total) by mouth daily.    Marland Kitchen atorvastatin (LIPITOR) 20 MG tablet Take 1 tablet (20 mg total) by mouth daily. 90 tablet 3  . clopidogrel (PLAVIX) 75 MG tablet Take 1 tablet (75 mg total) by mouth daily. 30 tablet 6  . lisinopril (ZESTRIL) 40 MG tablet Take 1 tablet (40 mg total) by mouth daily. 90 tablet 3  . metoprolol succinate (TOPROL XL) 25 MG 24 hr tablet Take 1 tablet (25 mg total) by mouth daily. 90 tablet 0   No current facility-administered medications for this visit.   Allergies:  Patient has no known allergies.   ROS:   No palpitations or syncope.  Physical Exam: VS:  BP 124/84   Pulse 88   Ht 6' (1.829 m)   Wt 227 lb (103 kg)   SpO2 97%   BMI 30.79 kg/m , BMI Body mass index is 30.79 kg/m.  Wt Readings from Last 3 Encounters:  12/11/19 227 lb (103 kg)  10/21/19 229 lb (103.9 kg)  08/08/18 220 lb (99.8 kg)    General: Patient  appears comfortable at rest. HEENT: Conjunctiva and lids normal, wearing a mask. Neck: Supple, no elevated JVP or carotid bruits, no thyromegaly. Lungs: Clear to auscultation, nonlabored breathing at rest. Cardiac: Regular rate and rhythm, no S3 or significant systolic murmur, no pericardial rub. Extremities: No pitting edema, distal pulses 2+.  ECG:  An ECG dated 10/21/2019 was personally reviewed today and demonstrated:  Sinus rhythm with leftward axis.  Recent Labwork: 10/22/2019: ALT 44; AST 43; BUN 15; Creatinine, Ser 1.11; Hemoglobin 17.2; Platelets 268; Potassium 4.9; Sodium 139; TSH 2.499     Component Value Date/Time   CHOL 150 10/22/2019 0915   TRIG 159 (H) 10/22/2019 0915   HDL 28 (L) 10/22/2019 0915   CHOLHDL 5.4 10/22/2019 0915   VLDL  32 10/22/2019 0915   LDLCALC 90 10/22/2019 0915    Other Studies Reviewed Today:  Exercise Myoview 11/25/2019:  Blood pressure demonstrated a hypertensive response to exercise.  There was no ST segment deviation noted during stress.  The study is normal. There are no perfusion defect consistent with prior infarct or current ischemia.  This is a low risk study.  The left ventricular ejection fraction is normal (55-65%).  [facility] treadmill score of 9.5 supports low risk for major cardiac events.  Assessment and Plan:  1.  Multivessel CAD status post CABG in 2017.  He does not describe any active angina at this time and had a low risk exercise Myoview in June.  He reports compliance with medical regimen noted above, blood pressure well controlled today.  Continue regular exercise and observation.  2.  Mixed hyperlipidemia, he is on Lipitor.  Check FLP for next visit.  Medication Adjustments/Labs and Tests Ordered: Current medicines are reviewed at length with the patient today.  Concerns regarding medicines are outlined above.   Tests Ordered: Orders Placed This Encounter  Procedures  . Lipid Profile    Medication Changes: No orders of the defined types were placed in this encounter.   Disposition:  Follow up 6 months in the [place] office.  Signed, Jonelle Sidle, MD, [facility] 12/11/2019 2:30 PM    [facility] at [facility] [address], [place], [place] [address] Phone: [phone]; Fax: [phone]

## 2019-12-11 NOTE — Patient Instructions (Signed)
Medication Instructions:  Your physician recommends that you continue on your current medications as directed. Please refer to the Current Medication list given to you today.  *If you need a refill on your cardiac medications before your next appointment, please call your pharmacy*   Lab Work: Fasting Lipids JUST before next visit in 6 months  If you have labs (blood work) drawn today and your tests are completely normal, you will receive your results only by: Marland Kitchen MyChart Message (if you have MyChart) OR . A paper copy in the mail If you have any lab test that is abnormal or we need to change your treatment, we will call you to review the results.   Testing/Procedures: None today   Follow-Up: At [facility], you and your health needs are our priority.  As part of our continuing mission to provide you with exceptional heart care, we have created designated Provider Care Teams.  These Care Teams include your primary Cardiologist (physician) and Advanced Practice Providers (APPs -  Physician Assistants and Nurse Practitioners) who all work together to provide you with the care you need, when you need it.  We recommend signing up for the patient portal called "MyChart".  Sign up information is provided on this After Visit Summary.  MyChart is used to connect with patients for Virtual Visits (Telemedicine).  Patients are able to view lab/test results, encounter notes, upcoming appointments, etc.  Non-urgent messages can be sent to your provider as well.   To learn more about what you can do with MyChart, go to [email or web address].    Your next appointment:   6 month(s)  The format for your next appointment:   In Person  Provider:   Nona Dell, MD   Other Instructions None      Thank you for choosing [facility] !

## 2020-04-29 ENCOUNTER — Other Ambulatory Visit: Payer: Self-pay | Admitting: Cardiology

## 2020-04-29 MED ORDER — ATORVASTATIN CALCIUM 20 MG PO TABS: 20.0000 mg | ORAL_TABLET | Freq: Every day | ORAL | 0 refills | Status: DC

## 2020-04-29 MED ORDER — LISINOPRIL 40 MG PO TABS: 40.0000 mg | ORAL_TABLET | Freq: Every day | ORAL | 0 refills | Status: DC

## 2020-04-29 MED ORDER — METOPROLOL SUCCINATE ER 25 MG PO TB24: 25.0000 mg | ORAL_TABLET | Freq: Every day | ORAL | 0 refills | Status: DC

## 2020-04-29 MED ORDER — CLOPIDOGREL BISULFATE 75 MG PO TABS: 75.0000 mg | ORAL_TABLET | Freq: Every day | ORAL | 0 refills | Status: DC

## 2020-04-29 NOTE — Telephone Encounter (Signed)
New message      *STAT* If patient is at the pharmacy, call can be transferred to refill team.   1. Which medications need to be refilled? (please list name of each medication and dose if known)  All his medications   2. Which pharmacy/location (including street and city if local pharmacy) is medication to be sent to [facility] on [address]   3. Do they need a 30 day or 90 day supply?  90

## 2020-04-29 NOTE — Telephone Encounter (Signed)
Refilled as requested  

## 2020-05-27 ENCOUNTER — Encounter: Payer: Self-pay | Admitting: Cardiology

## 2020-05-27 ENCOUNTER — Ambulatory Visit (INDEPENDENT_AMBULATORY_CARE_PROVIDER_SITE_OTHER): Payer: Self-pay | Admitting: Cardiology

## 2020-05-27 ENCOUNTER — Other Ambulatory Visit: Payer: Self-pay

## 2020-05-27 VITALS — BP 136/80 | HR 80 | Ht 72.0 in | Wt 227.0 lb

## 2020-05-27 DIAGNOSIS — E782 Mixed hyperlipidemia: Secondary | ICD-10-CM

## 2020-05-27 DIAGNOSIS — I25119 Atherosclerotic heart disease of native coronary artery with unspecified angina pectoris: Secondary | ICD-10-CM

## 2020-05-27 NOTE — Progress Notes (Signed)
Cardiology Office Note  Date: 05/27/2020   ID: DAMYEN KNOLL, DOB Jun 01, 1966, MRN [id number]  PCP:  Patient, No Pcp Per  Cardiologist:  Nona Dell, MD Electrophysiologist:  None   Chief Complaint  Patient presents with  . Cardiac follow-up    History of Present Illness: Justin Villa is a 54 y.o. male last seen in July.  He is here for a follow-up visit.  Reports no active angina symptoms on current medications.  He still exercises 6 days a week at a gym in [place].   I reviewed his current medical regimen which is outlined below, he does not report any intolerances.  We did discuss getting a follow-up fasting lipid profile.   Past Medical History:  Diagnosis Date  . CAD (coronary artery disease) 11/27/2015   a. S/p NSTEMI 6/17 >> s/p CABG // b. LHC 6/17: oLAD 50, mLAD 100, D1 70, D2 95, oLCx 95, mLCx 65, OM1 85, OM2 90, mRCA 85, dRCA 75, RPDA 40/50, RPLB2 50   . Carotid stenosis    a. Carotid US 6/17: bilat ICA 1-39%  . Essential hypertension   . History of non-ST elevation myocardial infarction (NSTEMI) 11/2015  . Hyperlipidemia     Past Surgical History:  Procedure Laterality Date  . BACK SURGERY    . CARDIAC CATHETERIZATION N/A 11/26/2015   Procedure: Left Heart Cath and Coronary Angiography;  Surgeon: Lyn Records, MD;  Location: [facility] INVASIVE CV LAB;  Service: Cardiovascular;  Laterality: N/A;  . CORONARY ARTERY BYPASS GRAFT N/A 11/27/2015   Procedure: CORONARY ARTERY BYPASS GRAFTING (CABG) x 5 (LIMA to LAD, SVG to DIAGONAL, SVG SEQUENTIALLY to OM1 and OM2, SVG to PDA) with EVH from right leg using greater saphenous vein and mammary.;  Surgeon: Delight Ovens, MD;  Location: [facility] OR;  Service: Open Heart Surgery;  Laterality: N/A;  . INTRAOPERATIVE TRANSESOPHAGEAL ECHOCARDIOGRAM N/A 11/27/2015   Procedure: INTRAOPERATIVE TRANSESOPHAGEAL ECHOCARDIOGRAM;  Surgeon: Delight Ovens, MD;  Location: [facility] OR;  Service: Open Heart Surgery;  Laterality: N/A;  . KNEE  SURGERY      Current Outpatient Medications  Medication Sig Dispense Refill  . aspirin EC 81 MG EC tablet Take 1 tablet (81 mg total) by mouth daily.    Marland Kitchen atorvastatin (LIPITOR) 20 MG tablet Take 1 tablet (20 mg total) by mouth daily. 90 tablet 0  . clopidogrel (PLAVIX) 75 MG tablet Take 1 tablet (75 mg total) by mouth daily. 90 tablet 0  . lisinopril (ZESTRIL) 40 MG tablet Take 1 tablet (40 mg total) by mouth daily. 90 tablet 0  . metoprolol succinate (TOPROL XL) 25 MG 24 hr tablet Take 1 tablet (25 mg total) by mouth daily. 90 tablet 0   No current facility-administered medications for this visit.   Allergies:  Patient has no known allergies.   ROS: No palpitations or syncope.  Physical Exam: VS:  BP 136/80   Pulse 80   Ht 6' (1.829 m)   Wt 227 lb (103 kg)   SpO2 98%   BMI 30.79 kg/m , BMI Body mass index is 30.79 kg/m.  Wt Readings from Last 3 Encounters:  05/27/20 227 lb (103 kg)  12/11/19 227 lb (103 kg)  10/21/19 229 lb (103.9 kg)    General: Patient appears comfortable at rest. HEENT: Conjunctiva and lids normal, wearing a mask. Neck: Supple, no elevated JVP or carotid bruits, no thyromegaly. Lungs: Clear to auscultation, nonlabored breathing at rest. Cardiac: Regular rate and rhythm, no  S3 or significant systolic murmur. Extremities: No pitting edema.  ECG:  An ECG dated 10/21/2019 was personally reviewed today and demonstrated:  Sinus rhythm with leftward axis.  Recent Labwork: 10/22/2019: ALT 44; AST 43; BUN 15; Creatinine, Ser 1.11; Hemoglobin 17.2; Platelets 268; Potassium 4.9; Sodium 139; TSH 2.499     Component Value Date/Time   CHOL 150 10/22/2019 0915   TRIG 159 (H) 10/22/2019 0915   HDL 28 (L) 10/22/2019 0915   CHOLHDL 5.4 10/22/2019 0915   VLDL 32 10/22/2019 0915   LDLCALC 90 10/22/2019 0915    Other Studies Reviewed Today:  Exercise Myoview 11/25/2019:  Blood pressure demonstrated a hypertensive response to exercise.  There was no ST segment  deviation noted during stress.  The study is normal. There are no perfusion defect consistent with prior infarct or current ischemia.  This is a low risk study.  The left ventricular ejection fraction is normal (55-65%).  [facility] treadmill score of 9.5 supports low risk for major cardiac events.  Assessment and Plan:  1.  Multivessel CAD status post CABG in 2017.  He reports no active angina symptoms and continues with regular exercise.  Myoview from June was low risk.  Continue aspirin, Plavix, Zestril, Toprol-XL, and Lipitor.  2.  Mixed hyperlipidemia, on Lipitor.  Last LDL 90.  Repeat FLP.  Medication Adjustments/Labs and Tests Ordered: Current medicines are reviewed at length with the patient today.  Concerns regarding medicines are outlined above.   Tests Ordered: Orders Placed This Encounter  Procedures  . Lipid Profile    Medication Changes: No orders of the defined types were placed in this encounter.   Disposition:  Follow up 6 months in the [place] office.  Signed, Jonelle Sidle, MD, [facility] 05/27/2020 2:34 PM    [facility] [address] [place], [place] [address] Phone: [phone]; Fax: [phone]

## 2020-05-27 NOTE — Patient Instructions (Signed)
Medication Instructions:  Your physician recommends that you continue on your current medications as directed. Please refer to the Current Medication list given to you today.  *If you need a refill on your cardiac medications before your next appointment, please call your pharmacy*   Lab Work: Fasting lipids  If you have labs (blood work) drawn today and your tests are completely normal, you will receive your results only by:  MyChart Message (if you have MyChart) OR  A paper copy in the mail If you have any lab test that is abnormal or we need to change your treatment, we will call you to review the results.   Testing/Procedures: None today   Follow-Up: At [facility], you and your health needs are our priority.  As part of our continuing mission to provide you with exceptional heart care, we have created designated Provider Care Teams.  These Care Teams include your primary Cardiologist (physician) and Advanced Practice Providers (APPs -  Physician Assistants and Nurse Practitioners) who all work together to provide you with the care you need, when you need it.  We recommend signing up for the patient portal called "MyChart".  Sign up information is provided on this After Visit Summary.  MyChart is used to connect with patients for Virtual Visits (Telemedicine).  Patients are able to view lab/test results, encounter notes, upcoming appointments, etc.  Non-urgent messages can be sent to your provider as well.   To learn more about what you can do with MyChart, go to [email or web address].    Your next appointment:   6 month(s)  The format for your next appointment:   In Person  Provider:   Nona Dell, MD   Other Instructions None      Thank you for choosing [facility] !

## 2020-06-12 ENCOUNTER — Encounter: Payer: Self-pay | Admitting: Orthopedic Surgery

## 2020-06-12 ENCOUNTER — Encounter: Payer: Self-pay | Admitting: Emergency Medicine

## 2020-06-12 ENCOUNTER — Ambulatory Visit (INDEPENDENT_AMBULATORY_CARE_PROVIDER_SITE_OTHER): Payer: Self-pay

## 2020-06-12 ENCOUNTER — Emergency Department (HOSPITAL_COMMUNITY)
Admission: EM | Admit: 2020-06-12 | Discharge: 2020-06-12 | Disposition: A | Discharge status: Home / Self Care | Payer: Self-pay | Attending: Emergency Medicine | Admitting: Emergency Medicine

## 2020-06-12 ENCOUNTER — Encounter (HOSPITAL_COMMUNITY): Payer: Self-pay

## 2020-06-12 ENCOUNTER — Ambulatory Visit
Admission: EM | Admit: 2020-06-12 | Discharge: 2020-06-12 | Disposition: A | Discharge status: Home / Self Care | Payer: Self-pay | Attending: Family Medicine | Admitting: Family Medicine

## 2020-06-12 ENCOUNTER — Emergency Department (HOSPITAL_COMMUNITY): Payer: Self-pay

## 2020-06-12 ENCOUNTER — Other Ambulatory Visit: Payer: Self-pay

## 2020-06-12 DIAGNOSIS — M79671 Pain in right foot: Secondary | ICD-10-CM

## 2020-06-12 DIAGNOSIS — Z951 Presence of aortocoronary bypass graft: Secondary | ICD-10-CM | POA: Insufficient documentation

## 2020-06-12 DIAGNOSIS — Z20822 Contact with and (suspected) exposure to covid-19: Secondary | ICD-10-CM | POA: Insufficient documentation

## 2020-06-12 DIAGNOSIS — S92512A Displaced fracture of proximal phalanx of left lesser toe(s), initial encounter for closed fracture: Secondary | ICD-10-CM

## 2020-06-12 DIAGNOSIS — I10 Essential (primary) hypertension: Secondary | ICD-10-CM | POA: Insufficient documentation

## 2020-06-12 DIAGNOSIS — I2510 Atherosclerotic heart disease of native coronary artery without angina pectoris: Secondary | ICD-10-CM | POA: Insufficient documentation

## 2020-06-12 DIAGNOSIS — L089 Local infection of the skin and subcutaneous tissue, unspecified: Secondary | ICD-10-CM | POA: Insufficient documentation

## 2020-06-12 DIAGNOSIS — M7989 Other specified soft tissue disorders: Secondary | ICD-10-CM

## 2020-06-12 LAB — CBC WITH DIFFERENTIAL/PLATELET
Abs Immature Granulocytes: 0.03 10*3/uL (ref 0.00–0.07)
Basophils Absolute: 0.1 10*3/uL (ref 0.0–0.1)
Basophils Relative: 1 %
Eosinophils Absolute: 0.3 10*3/uL (ref 0.0–0.5)
Eosinophils Relative: 5 %
HCT: 48.8 % (ref 39.0–52.0)
Hemoglobin: 15.7 g/dL (ref 13.0–17.0)
Immature Granulocytes: 1 %
Lymphocytes Relative: 16 %
Lymphs Abs: 1.1 10*3/uL (ref 0.7–4.0)
MCH: 28.2 pg (ref 26.0–34.0)
MCHC: 32.2 g/dL (ref 30.0–36.0)
MCV: 87.6 fL (ref 80.0–100.0)
Monocytes Absolute: 0.7 10*3/uL (ref 0.1–1.0)
Monocytes Relative: 11 %
Neutro Abs: 4.3 10*3/uL (ref 1.7–7.7)
Neutrophils Relative %: 66 %
Platelets: 311 10*3/uL (ref 150–400)
RBC: 5.57 MIL/uL (ref 4.22–5.81)
RDW: 14.5 % (ref 11.5–15.5)
WBC: 6.5 10*3/uL (ref 4.0–10.5)
nRBC: 0 % (ref 0.0–0.2)

## 2020-06-12 LAB — RESP PANEL BY RT-PCR (FLU A&B, COVID) ARPGX2
Influenza A by PCR: NEGATIVE
Influenza B by PCR: NEGATIVE
SARS Coronavirus 2 by RT PCR: NEGATIVE

## 2020-06-12 LAB — BASIC METABOLIC PANEL
Anion gap: 8 (ref 5–15)
BUN: 19 mg/dL (ref 6–20)
CO2: 26 mmol/L (ref 22–32)
Calcium: 8.8 mg/dL — ABNORMAL LOW (ref 8.9–10.3)
Chloride: 103 mmol/L (ref 98–111)
Creatinine, Ser: 1.42 mg/dL — ABNORMAL HIGH (ref 0.61–1.24)
GFR, Estimated: 58 mL/min — ABNORMAL LOW (ref >60)
Glucose, Bld: 92 mg/dL (ref 70–99)
Potassium: 4.7 mmol/L (ref 3.5–5.1)
Sodium: 137 mmol/L (ref 135–145)

## 2020-06-12 LAB — C-REACTIVE PROTEIN: CRP: 1.7 mg/dL — ABNORMAL HIGH (ref <1.0)

## 2020-06-12 LAB — SEDIMENTATION RATE: Sed Rate: 14 mm/hr (ref 0–16)

## 2020-06-12 MED ORDER — CEPHALEXIN 500 MG PO CAPS: 500.0000 mg | ORAL_CAPSULE | Freq: Four times a day (QID) | ORAL | 0 refills | Status: DC

## 2020-06-12 MED ORDER — CEFAZOLIN SODIUM-DEXTROSE 1-4 GM/50ML-% IV SOLN
1.0000 g | Freq: Once | INTRAVENOUS | Status: AC
  Administered 2020-06-12: 1 g via INTRAVENOUS
  Filled 2020-06-12 (×2): qty 50

## 2020-06-12 NOTE — ED Provider Notes (Signed)
Emergency Department Provider Note   I have reviewed the triage vital signs and the nursing notes.   HISTORY  Chief Complaint Foot Injury   HPI Justin Villa is a 55 y.o. male presents to the emergency department after being referred from urgent care for evaluation of right foot pain, swelling, redness.  Symptoms have been going on now for 2 weeks.  He denies any known injury.  He does NOT have a history of gout but states he was talking with a friend who does have a history of gout and he has been giving him medication which typically treats gout and symptoms have actually been getting worse.  Patient is unsure of the name of the medicine he has been taking but shows me a picture of a turquoise and purple capsule on his phone.  He denies any fevers.  Denies any known injury to the foot.  The x-ray from urgent care shows fracture of the little toe but also concern for irregularity of the joint space and he was referred for further work-up of this finding.  He is not having leg pain or swelling.  No pain or redness in other joints.    Past Medical History:  Diagnosis Date  . CAD (coronary artery disease) 11/27/2015   a. S/p NSTEMI 6/17 >> s/p CABG // b. LHC 6/17: oLAD 50, mLAD 100, D1 70, D2 95, oLCx 95, mLCx 65, OM1 85, OM2 90, mRCA 85, dRCA 75, RPDA 40/50, RPLB2 50   . Carotid stenosis    a. Carotid US 6/17: bilat ICA 1-39%  . Essential hypertension   . History of non-ST elevation myocardial infarction (NSTEMI) 11/2015  . Hyperlipidemia     Patient Active Problem List   Diagnosis Date Noted  . HTN (hypertension) 12/10/2015  . HLD (hyperlipidemia) 12/10/2015  . Carotid stenosis   . CAD (coronary artery disease) 11/27/2015  . NSTEMI (non-ST elevated myocardial infarction) (HCC) 11/26/2015  . ARTHRITIS, WRIST 07/19/2010    Past Surgical History:  Procedure Laterality Date  . BACK SURGERY    . CARDIAC CATHETERIZATION N/A 11/26/2015   Procedure: Left Heart Cath and Coronary  Angiography;  Surgeon: Lyn Records, MD;  Location: [facility] INVASIVE CV LAB;  Service: Cardiovascular;  Laterality: N/A;  . CORONARY ARTERY BYPASS GRAFT N/A 11/27/2015   Procedure: CORONARY ARTERY BYPASS GRAFTING (CABG) x 5 (LIMA to LAD, SVG to DIAGONAL, SVG SEQUENTIALLY to OM1 and OM2, SVG to PDA) with EVH from right leg using greater saphenous vein and mammary.;  Surgeon: Delight Ovens, MD;  Location: [facility] OR;  Service: Open Heart Surgery;  Laterality: N/A;  . INTRAOPERATIVE TRANSESOPHAGEAL ECHOCARDIOGRAM N/A 11/27/2015   Procedure: INTRAOPERATIVE TRANSESOPHAGEAL ECHOCARDIOGRAM;  Surgeon: Delight Ovens, MD;  Location: [facility] OR;  Service: Open Heart Surgery;  Laterality: N/A;  . KNEE SURGERY      Allergies Patient has no known allergies.  Family History  Problem Relation Age of Onset  . Heart disease Father     Social History Social History   Tobacco Use  . Smoking status: Never Smoker  . Smokeless tobacco: Never Used  Vaping Use  . Vaping Use: Never used  Substance Use Topics  . Alcohol use: Yes    Alcohol/week: 2.0 standard drinks    Types: 2 Shots of liquor per week    Comment: everyother day  . Drug use: No    Review of Systems  Constitutional: No fever/chills Eyes: No visual changes. ENT: No sore throat. Cardiovascular: Denies  chest pain. Respiratory: Denies shortness of breath. Gastrointestinal: No abdominal pain.  No nausea, no vomiting.  No diarrhea.  No constipation. Genitourinary: Negative for dysuria. Musculoskeletal: Negative for back pain. Positive foot pain, redness, and swelling.  Skin: Right foot redness and swelling.  Neurological: Negative for headaches, focal weakness or numbness.  10-point ROS otherwise negative.  ____________________________________________   PHYSICAL EXAM:  VITAL SIGNS: ED Triage Vitals  Enc Vitals Group     BP 06/12/20 1148 (!) 144/103     Pulse Rate 06/12/20 1148 67     Resp 06/12/20 1148 20     Temp 06/12/20 1148 98.2  F (36.8 C)     Temp Source 06/12/20 1148 Oral     SpO2 06/12/20 1148 100 %     Weight 06/12/20 1150 220 lb 8 oz (100 kg)     Height 06/12/20 1150 6' (1.829 m)   Constitutional: Alert and oriented. Well appearing and in no acute distress. Eyes: Conjunctivae are normal.  Head: Atraumatic. Nose: No congestion/rhinnorhea. Mouth/Throat: Mucous membranes are moist.  Neck: No stridor.   Cardiovascular: Normal rate, regular rhythm. Good peripheral circulation. Grossly normal heart sounds.   Respiratory: Normal respiratory effort.  No retractions. Lungs CTAB. Gastrointestinal: Soft and nontender. No distention.  Musculoskeletal: Tenderness to palpation and attempted movement of the right fifth toe. No deformity. No laceration or ulceration. Erythema at warmth focused over the base of the 5th toe but extending over the dorsal aspect of the forefoot with edema throughout the right foot and ankle. No erythema tracking up the calf/leg.  Neurologic:  Normal speech and language. No gross focal neurologic deficits are appreciated.  Skin:  Skin is warm and dry. Erythema over the dorsal aspect of the forefoot as above.   ____________________________________________   LABS (all labs ordered are listed, but only abnormal results are displayed)  Labs Reviewed  BASIC METABOLIC PANEL - Abnormal; Notable for the following components:      Result Value   Creatinine, Ser 1.42 (*)    Calcium 8.8 (*)    GFR, Estimated 58 (*)    All other components within normal limits  C-REACTIVE PROTEIN - Abnormal; Notable for the following components:   CRP 1.7 (*)    All other components within normal limits  RESP PANEL BY RT-PCR (FLU A&B, COVID) ARPGX2  CBC WITH DIFFERENTIAL/PLATELET  SEDIMENTATION RATE   ____________________________________________  RADIOLOGY  DG Foot Complete Right  Result Date: 06/12/2020 CLINICAL DATA:  Right foot pain and redness for 3 weeks. No reported injury. EXAM: RIGHT FOOT COMPLETE -  3+ VIEW COMPARISON:  None. FINDINGS: Linear fracture crosses the base of the fifth toe proximal phalanx, nondisplaced and non comminuted, best appreciated on the AP view. There is bone resorption from the lateral margin of the fifth metatarsal head, consistent with a marginal erosion, with a smaller similar appearing erosion from the medial fifth metatarsal head, latter seen on the oblique view the former on the AP view. There is asymmetric narrowing of the fifth metatarsophalangeal joint and diffuse surrounding soft tissue swelling. No other fractures or areas of bone resorption/erosions. Remaining joints are normally spaced and aligned. There is also diffuse soft tissue swelling and a minimal plantar calcaneal spur. No soft tissue air. IMPRESSION: 1. Nondisplaced transverse fracture across the base of the fifth toe proximal phalanx. 2. There are arthropathic changes of the fifth metatarsophalangeal joint. There is bone resorption versus marginal erosions from the lateral fifth metatarsal head, with a small area of from  the medial fifth metatarsal head, as well as asymmetric joint space narrowing and surrounding soft tissue swelling. Differential diagnosis includes gout, with the joint space narrowing somewhat atypical. Osteoarthritis and septic arthropathy should be considered if there are consistent clinical findings. Electronically Signed   By: Lajean Manes M.D.   On: 06/12/2020 10:44    ____________________________________________   PROCEDURES  Procedure(s) performed:   Procedures  None  ____________________________________________   INITIAL IMPRESSION / ASSESSMENT AND PLAN / ED COURSE  Pertinent labs & imaging results that were available during my care of the patient were reviewed by me and considered in my medical decision making (see chart for details).  Patient presents emergency department with abnormal x-ray from urgent care.  Patient has no history of gout but has been treating it as  such over the past week with worsening symptoms.  Patient does have fracture noted on x-ray which is complicating septic joint assessment as he does have pain with any attempted range of motion.  Likely inflammatory arthritis given prolonged symptoms and lack of fever but will discuss with orthopedics.   12:53 PM  Spoke with Dr. Aline Brochure regarding the patient's case and x-ray findings.  Agrees with inflammatory lab markers as ordered and agrees with plan for MRI of the foot to evaluate for inflammatory/infectious process in the area of the joint in question.   Care transferred to Dr. Rogene Houston pending MRI.  ____________________________________________  FINAL CLINICAL IMPRESSION(S) / ED DIAGNOSES  Final diagnoses:  Right foot infection     MEDICATIONS GIVEN DURING THIS VISIT:  Medications  ceFAZolin (ANCEF) IVPB 1 g/50 mL premix (0 g Intravenous Stopped 06/12/20 1912)     NEW OUTPATIENT MEDICATIONS STARTED DURING THIS VISIT:  Discharge Medication List as of 06/12/2020  6:25 PM    START taking these medications   Details  cephALEXin (KEFLEX) 500 MG capsule Take 1 capsule (500 mg total) by mouth 4 (four) times daily., Starting Fri 06/12/2020, Normal        Note:  This document was prepared using Dragon voice recognition software and may include unintentional dictation errors.  Nanda Quinton, MD, [facility] Emergency Medicine    Jahziah Simonin, Wonda Olds, MD 06/15/20 [phone]

## 2020-06-12 NOTE — Discharge Instructions (Addendum)
Discussed with Dr. Romeo Apple take the antibiotics as directed.  He will see the in the office on Monday.  You are to receive your IV antibiotic 1 dose here prior to discharge.  Return for any new or worse symptoms.  Return for fevers or feeling worse.

## 2020-06-12 NOTE — ED Notes (Signed)
Pt denies injury   Reports pain and redness x 3 weeks   Here for eval

## 2020-06-12 NOTE — ED Triage Notes (Signed)
RT foot pain for past few days.  Pt has hx of gout

## 2020-06-12 NOTE — ED Provider Notes (Signed)
[facility] CARE CENTER   [id number] 06/12/20 Arrival Time: 1020  ER:[facility] PAIN  SUBJECTIVE: History from: patient. Justin Villa is a 55 y.o. male complains of right foot pain that began about 3 weeks ago. Reports pain, swelling, redness that have been intermittent. Has taken gout medication with temporary relief. Denies a precipitating event or specific injury. Localizes the pain to the lateral and plantar surface of the foot. Describes the pain as constant and achy in character. Has tried OTC medications without relief. Symptoms are made worse with activity.  Denies similar symptoms in the past.  Denies fever, chills, ecchymosis, weakness, numbness and tingling, saddle paresthesias, loss of bowel or bladder function.      ROS: As per HPI.  All other pertinent ROS negative.     Past Medical History:  Diagnosis Date   CAD (coronary artery disease) 11/27/2015   a. S/p NSTEMI 6/17 >> s/p CABG // b. LHC 6/17: oLAD 50, mLAD 100, D1 70, D2 95, oLCx 95, mLCx 65, OM1 85, OM2 90, mRCA 85, dRCA 75, RPDA 40/50, RPLB2 50    Carotid stenosis    a. Carotid US 6/17: bilat ICA 1-39%   Essential hypertension    History of non-ST elevation myocardial infarction (NSTEMI) 11/2015   Hyperlipidemia    Past Surgical History:  Procedure Laterality Date   BACK SURGERY     CARDIAC CATHETERIZATION N/A 11/26/2015   Procedure: Left Heart Cath and Coronary Angiography;  Surgeon: Lyn Records, MD;  Location: [facility] INVASIVE CV LAB;  Service: Cardiovascular;  Laterality: N/A;   CORONARY ARTERY BYPASS GRAFT N/A 11/27/2015   Procedure: CORONARY ARTERY BYPASS GRAFTING (CABG) x 5 (LIMA to LAD, SVG to DIAGONAL, SVG SEQUENTIALLY to OM1 and OM2, SVG to PDA) with EVH from right leg using greater saphenous vein and mammary.;  Surgeon: Delight Ovens, MD;  Location: [facility] OR;  Service: Open Heart Surgery;  Laterality: N/A;   INTRAOPERATIVE TRANSESOPHAGEAL ECHOCARDIOGRAM N/A 11/27/2015   Procedure: INTRAOPERATIVE  TRANSESOPHAGEAL ECHOCARDIOGRAM;  Surgeon: Delight Ovens, MD;  Location: [facility] OR;  Service: Open Heart Surgery;  Laterality: N/A;   KNEE SURGERY     No Known Allergies No current facility-administered medications on file prior to encounter.   Current Outpatient Medications on File Prior to Encounter  Medication Sig Dispense Refill   aspirin EC 81 MG EC tablet Take 1 tablet (81 mg total) by mouth daily.     atorvastatin (LIPITOR) 20 MG tablet Take 1 tablet (20 mg total) by mouth daily. 90 tablet 0   clopidogrel (PLAVIX) 75 MG tablet Take 1 tablet (75 mg total) by mouth daily. 90 tablet 0   lisinopril (ZESTRIL) 40 MG tablet Take 1 tablet (40 mg total) by mouth daily. 90 tablet 0   metoprolol succinate (TOPROL XL) 25 MG 24 hr tablet Take 1 tablet (25 mg total) by mouth daily. 90 tablet 0   Social History   Socioeconomic History   Marital status: Single    Spouse name: Not on file   Number of children: Not on file   Years of education: Not on file   Highest education level: Not on file  Occupational History   Not on file  Tobacco Use   Smoking status: Never Smoker   Smokeless tobacco: Never Used  Vaping Use   Vaping Use: Never used  Substance and Sexual Activity   Alcohol use: Yes    Alcohol/week: 2.0 standard drinks    Types: 2 Shots of liquor per week  Comment: everyother day   Drug use: No   Sexual activity: Not on file  Other Topics Concern   Not on file  Social History Narrative   Not on file   Social Determinants of Health   Financial Resource Strain: Not on file  Food Insecurity: Not on file  Transportation Needs: Not on file  Physical Activity: Not on file  Stress: Not on file  Social Connections: Not on file  Intimate Partner Violence: Not on file   Family History  Problem Relation Age of Onset   Heart disease Father     OBJECTIVE:  Vitals:   06/12/20 1028  BP: (!) 144/97  Pulse: 88  Resp: 16  Temp: 98.3 F (36.8 C)   TempSrc: Oral  SpO2: 98%    General appearance: ALERT; in no acute distress.  Head: NCAT Lungs: Normal respiratory effort CV: pulses 2+ bilaterally. Cap refill < 2 seconds Musculoskeletal:  Inspection: Skin warm, dry, clear and intact. Erythema and swelling to the entire R foot, concentrated to the lateral aspect of the foot Palpation: R foot tender to palpation ROM: left foot limited ROM active and passive Skin: warm and dry Neurologic: Ambulates without difficulty; Sensation intact about the upper/ lower extremities Psychological: alert and cooperative; normal mood and affect  DIAGNOSTIC STUDIES:  No results found.   ASSESSMENT & PLAN:  1. Closed displaced fracture of proximal phalanx of lesser toe of left foot, initial encounter   2. Right foot pain   3. Swelling of right foot    Xray shows nondisplaced transverse fracture of the 5th proximal phalanx, septic arthropathy cannot be ruled out Given xray findings, recommend further workup in the ER in the case of septic arthropathy Patient to ER via private vehicle   Moshe Cipro, NP 06/14/20 2014

## 2020-06-12 NOTE — ED Provider Notes (Signed)
Discussed with Dr. Romeo Apple the MRI results.  He is recommending since patient appears pretty stable and has no significant medical problems that he received 1 g of Ancef here and then continue on 500 mg of Keflex every 6 hours at home and he will see him in the office on Monday.  He could at that point in time if necessary arrange outpatient surgery.  Patient is okay with the plan.   Vanetta Mulders, MD 06/12/20 1820

## 2020-06-12 NOTE — ED Notes (Signed)
Pt up for discharge at the same time IV antibiotics are ordered   Pt moved into in progress for IV meds

## 2020-06-12 NOTE — Discharge Instructions (Signed)
Follow up in the ER for further evaluation and treatment for septic arthropathy

## 2020-06-12 NOTE — ED Triage Notes (Signed)
Pt presents to ED with right foot pain, edema, redness, warmth, and a hx of gout. Urgent Care sent pt to be eval.

## 2020-06-12 NOTE — Progress Notes (Unsigned)
55 year old male with hypertension no evidence of diabetes has a 3-week history of pain and redness in the small toe of his foot  His sed rate white count temperature were normal.  Patient denies any trauma.  No obvious wound.  C-reactive protein is still elevated slightly at 1.7 sed rate is 14 which is not elevated but at the high end of normal  MRI suggest osteomyelitis there is a fluid collection joint effusion and bone involvement.  X-ray suggest the same  Patient will need a biopsy most likely culture of the joint  Based on the bed situation at the hospital the pandemic etc. we will do this on an outpatient basis.  If inpatient IV antibiotics are warranted that can be arranged at a later date

## 2020-06-15 ENCOUNTER — Other Ambulatory Visit (HOSPITAL_COMMUNITY)
Admission: RE | Admit: 2020-06-15 | Discharge: 2020-06-15 | Disposition: A | Discharge status: Home / Self Care | Payer: Self-pay | Source: Ambulatory Visit | Attending: Orthopedic Surgery | Admitting: Orthopedic Surgery

## 2020-06-15 ENCOUNTER — Encounter: Payer: Self-pay | Admitting: Orthopedic Surgery

## 2020-06-15 ENCOUNTER — Other Ambulatory Visit: Payer: Self-pay

## 2020-06-15 ENCOUNTER — Encounter (HOSPITAL_COMMUNITY): Payer: Self-pay

## 2020-06-15 ENCOUNTER — Ambulatory Visit (INDEPENDENT_AMBULATORY_CARE_PROVIDER_SITE_OTHER): Payer: Self-pay | Admitting: Orthopedic Surgery

## 2020-06-15 ENCOUNTER — Encounter (HOSPITAL_COMMUNITY)
Admission: RE | Admit: 2020-06-15 | Discharge: 2020-06-15 | Disposition: A | Discharge status: Home / Self Care | Payer: Self-pay | Source: Ambulatory Visit | Attending: Orthopedic Surgery | Admitting: Orthopedic Surgery

## 2020-06-15 ENCOUNTER — Telehealth: Payer: Self-pay | Admitting: Radiology

## 2020-06-15 VITALS — BP 103/76 | HR 70 | Ht 72.0 in | Wt 220.0 lb

## 2020-06-15 DIAGNOSIS — M79671 Pain in right foot: Secondary | ICD-10-CM | POA: Insufficient documentation

## 2020-06-15 DIAGNOSIS — M009 Pyogenic arthritis, unspecified: Secondary | ICD-10-CM

## 2020-06-15 DIAGNOSIS — Z20822 Contact with and (suspected) exposure to covid-19: Secondary | ICD-10-CM | POA: Insufficient documentation

## 2020-06-15 DIAGNOSIS — Z01812 Encounter for preprocedural laboratory examination: Secondary | ICD-10-CM | POA: Insufficient documentation

## 2020-06-15 LAB — CBC WITH DIFFERENTIAL/PLATELET
Abs Immature Granulocytes: 0.04 10*3/uL (ref 0.00–0.07)
Basophils Absolute: 0.1 10*3/uL (ref 0.0–0.1)
Basophils Relative: 1 %
Eosinophils Absolute: 0.5 10*3/uL (ref 0.0–0.5)
Eosinophils Relative: 5 %
HCT: 46.6 % (ref 39.0–52.0)
Hemoglobin: 15.3 g/dL (ref 13.0–17.0)
Immature Granulocytes: 0 %
Lymphocytes Relative: 9 %
Lymphs Abs: 0.9 10*3/uL (ref 0.7–4.0)
MCH: 28.4 pg (ref 26.0–34.0)
MCHC: 32.8 g/dL (ref 30.0–36.0)
MCV: 86.5 fL (ref 80.0–100.0)
Monocytes Absolute: 0.5 10*3/uL (ref 0.1–1.0)
Monocytes Relative: 5 %
Neutro Abs: 7.8 10*3/uL — ABNORMAL HIGH (ref 1.7–7.7)
Neutrophils Relative %: 80 %
Platelets: 351 10*3/uL (ref 150–400)
RBC: 5.39 MIL/uL (ref 4.22–5.81)
RDW: 14.5 % (ref 11.5–15.5)
WBC: 9.8 10*3/uL (ref 4.0–10.5)
nRBC: 0 % (ref 0.0–0.2)

## 2020-06-15 LAB — BASIC METABOLIC PANEL
Anion gap: 10 (ref 5–15)
BUN: 21 mg/dL — ABNORMAL HIGH (ref 6–20)
CO2: 24 mmol/L (ref 22–32)
Calcium: 8.9 mg/dL (ref 8.9–10.3)
Chloride: 101 mmol/L (ref 98–111)
Creatinine, Ser: 1.34 mg/dL — ABNORMAL HIGH (ref 0.61–1.24)
GFR, Estimated: >60 mL/min (ref >60)
Glucose, Bld: 117 mg/dL — ABNORMAL HIGH (ref 70–99)
Potassium: 3.7 mmol/L (ref 3.5–5.1)
Sodium: 135 mmol/L (ref 135–145)

## 2020-06-15 NOTE — Patient Instructions (Signed)
Patient will schedule postop appointment after surgery

## 2020-06-15 NOTE — Telephone Encounter (Signed)
Surgery posted for Right foot I do see where notes said left foot, have corrected in everything I can thanks / Dr Romeo Apple will need to addend his note to take it out of an area there.

## 2020-06-15 NOTE — Patient Instructions (Addendum)
Justin Villa [facility]  06/15/2020     @PREFPERIOPPHARMACY @   Your procedure is scheduled on 06/16/2020.  Report to 08/14/2020 at 11:10 A.M.  Call this number if you have problems the morning of surgery:  [phone]   Remember:  Do not eat or drink after midnight.     Take these medicines the morning of surgery with A SIP OF WATER : metoprolol    Do not wear jewelry, make-up or nail polish.  Do not wear lotions, powders, or perfumes, or deodorant.  Do not shave 48 hours prior to surgery.  Men may shave face and neck.  Do not bring valuables to the hospital.  [facility] is not responsible for any belongings or valuables.  Contacts, dentures or bridgework may not be worn into surgery.  Leave your suitcase in the car.  After surgery it may be brought to your room.  For patients admitted to the hospital, discharge time will be determined by your treatment team.  Patients discharged the day of surgery will not be allowed to drive home.   Name and phone number of your driver:   family Special instructions: N/A Please read over the following fact sheets that you were given. Care and Recovery After Surgery    Incision and Drainage Incision and drainage is a surgical procedure to open and drain a fluid-filled sac. The sac may be filled with pus, mucus, or blood. Examples of fluid-filled sacs that may need surgical drainage include cysts, skin infections (abscesses), and red lumps that develop from a ruptured cyst or a small abscess (boils). You may need this procedure if the affected area is large, painful, infected, or not healing well. Tell a health care provider about:  Any allergies you have.  All medicines you are taking, including vitamins, herbs, eye drops, creams, and over-the-counter medicines.  Any problems you or family members have had with anesthetic medicines.  Any blood disorders you have or have had.  Any surgeries you have had.  Any medical conditions you have  or have had.  Whether you are pregnant or may be pregnant. What are the risks? Generally, this is a safe procedure. However, problems may occur, including:  Infection.  Bleeding.  Allergic reactions to medicines.  Scarring.  The cyst or abscess returns.  Damage to nerves or vessels. What happens before the procedure? Medicine Ask your health care provider about:  Changing or stopping your regular medicines. This is especially important if you are taking diabetes medicines or blood thinners.  Taking medicines such as aspirin and ibuprofen. These medicines can thin your blood. Do not take these medicines unless your health care provider tells you to take them.  Taking over-the-counter medicines, vitamins, herbs, and supplements. Tests You may have an exam or testing. These may include:  Ultrasound or other imaging tests to see how large or deep the fluid-filled sac is.  Blood tests to check for infection. General instructions  Follow instructions from your health care provider about eating or drinking restrictions.  Plan to have someone take you home from the hospital or clinic.  Ask your health care provider whether a responsible adult should care for you for at least 24 hours after you leave the hospital or clinic. This is important.  You may get a tetanus shot.  Ask your health care provider: ? How your surgery site will be marked or identified. ? What steps will be taken to help prevent infection. These may include:  Removing hair  at the surgery site.  Washing skin with a germ-killing soap.  Receiving antibiotic medicine. What happens during the procedure?  An IV may be inserted into one of your veins.  You will be given one or more of the following: ? A medicine to help you relax (sedative). ? A medicine to numb the area (local anesthetic). ? A medicine to make you fall asleep (general anesthetic).  An incision will be made in the top of the fluid-filled  sac.  Pus, blood, and mucus will be squeezed out, and a syringe or tube (drain) may be used to empty more fluid from the sac.  Your health care provider will do one of the following. He or she may: ? Leave the drain in place for several weeks to drain more fluid. ? Stitch open the edges of the incision to make a long-term opening for drainage (marsupialization).  The inside of the sac may be washed out (irrigated) with a sterile solution and packed with gauze before it is covered with a bandage (dressing).  Your health care provider do a culture test of the drainage fluid. The procedure may vary among health care providers and hospitals.   What happens after the procedure?  Your blood pressure, heart rate, breathing rate, and blood oxygen level will be monitored often until you leave the hospital or clinic.  Do not drive for 24 hours if you were given a sedative during your procedure. Summary  Incision and drainage is a surgical procedure to open and drain a fluid-filled sac. The sac may be filled with pus, mucus, or blood.  Before the procedure, you may be given antibiotic medicine to treat or help prevent infection.  During the procedure, an incision will be made in the top of the fluid-filled sac. Pus, blood, and mucus is squeezed out, and a syringe or tube (drain) may be used to empty more fluid from the sac.  The inside of the sac may be washed out (irrigated) with a sterile solution and packed with gauze before it is covered with a bandage (dressing). This information is not intended to replace advice given to you by your health care provider. Make sure you discuss any questions you have with your health care provider. Document Revised: 04/23/2018 Document Reviewed: 04/23/2018 Elsevier Patient Education  2021 Elsevier Inc.    General Anesthesia, Adult, Care After This sheet gives you information about how to care for yourself after your procedure. Your health care provider may  also give you more specific instructions. If you have problems or questions, contact your health care provider. What can I expect after the procedure? After the procedure, the following side effects are common:  Pain or discomfort at the IV site.  Nausea.  Vomiting.  Sore throat.  Trouble concentrating.  Feeling cold or chills.  Feeling weak or tired.  Sleepiness and fatigue.  Soreness and body aches. These side effects can affect parts of the body that were not involved in surgery. Follow these instructions at home: For the time period you were told by your health care provider:  Rest.  Do not participate in activities where you could fall or become injured.  Do not drive or use machinery.  Do not drink alcohol.  Do not take sleeping pills or medicines that cause drowsiness.  Do not make important decisions or sign legal documents.  Do not take care of children on your own.   Eating and drinking  Follow any instructions from your health care provider  about eating or drinking restrictions.  When you feel hungry, start by eating small amounts of foods that are soft and easy to digest (bland), such as toast. Gradually return to your regular diet.  Drink enough fluid to keep your urine pale yellow.  If you vomit, rehydrate by drinking water, juice, or clear broth. General instructions  If you have sleep apnea, surgery and certain medicines can increase your risk for breathing problems. Follow instructions from your health care provider about wearing your sleep device: ? Anytime you are sleeping, including during daytime naps. ? While taking prescription pain medicines, sleeping medicines, or medicines that make you drowsy.  Have a responsible adult stay with you for the time you are told. It is important to have someone help care for you until you are awake and alert.  Return to your normal activities as told by your health care provider. Ask your health care  provider what activities are safe for you.  Take over-the-counter and prescription medicines only as told by your health care provider.  If you smoke, do not smoke without supervision.  Keep all follow-up visits as told by your health care provider. This is important. Contact a health care provider if:  You have nausea or vomiting that does not get better with medicine.  You cannot eat or drink without vomiting.  You have pain that does not get better with medicine.  You are unable to pass urine.  You develop a skin rash.  You have a fever.  You have redness around your IV site that gets worse. Get help right away if:  You have difficulty breathing.  You have chest pain.  You have blood in your urine or stool, or you vomit blood. Summary  After the procedure, it is common to have a sore throat or nausea. It is also common to feel tired.  Have a responsible adult stay with you for the time you are told. It is important to have someone help care for you until you are awake and alert.  When you feel hungry, start by eating small amounts of foods that are soft and easy to digest (bland), such as toast. Gradually return to your regular diet.  Drink enough fluid to keep your urine pale yellow.  Return to your normal activities as told by your health care provider. Ask your health care provider what activities are safe for you. This information is not intended to replace advice given to you by your health care provider. Make sure you discuss any questions you have with your health care provider. Document Revised: 02/06/2020 Document Reviewed: 09/05/2019 Elsevier Patient Education  2021 Elsevier Inc.  Chlorhexidine topical antiseptic What is this medicine? CHLORHEXIDINE (klor HEX i deen) is used as a skin wound cleanser and a general skin cleanser. This medicine is also used as a surgical hand scrub and to cleanse the skin before surgery to help prevent infections. This  medicine may be used for other purposes; ask your health care provider or pharmacist if you have questions. COMMON BRAND NAME(S): Betasept, Chlorostat, Hibiclens What should I tell my health care provider before I take this medicine? They need to know if you have any of the following conditions:  any skin rashes or problems  an unusual or allergic reaction to chlorhexidine, other medicines, foods, dyes, or preservatives  pregnant or trying to get pregnant  breast-feeding How should I use this medicine? This medicine is for external use only. Do not take by mouth. Follow  the directions on the label or those given to you by your doctor or health care professional. Keep out of eyes, ears and mouth. This medicine should not be used as a preoperative skin preparation of the face or head. Talk to your pediatrician regarding the use of this medicine in children. While this medicine may be used for children for selected conditions, precautions do apply. Overdosage: If you think you have taken too much of this medicine contact a poison control center or emergency room at once. NOTE: This medicine is only for you. Do not share this medicine with others. What if I miss a dose? This does not apply; this medicine is not for regular use. What may interact with this medicine? Interactions are not expected. This list may not describe all possible interactions. Give your health care provider a list of all the medicines, herbs, non-prescription drugs, or dietary supplements you use. Also tell them if you smoke, drink alcohol, or use illegal drugs. Some items may interact with your medicine. What should I watch for while using this medicine? This medicine may cause severe allergic reactions. Notify a health care professional right away if you think you are having an allergic reaction. Do not take this medicine by mouth. Avoid contact with your ears and eyes. If contact with the eyes occur, rinse the eyes well  with plenty of cool tap water. What side effects may I notice from receiving this medicine? Side effects that you should report to your doctor or health care professional as soon as possible:  allergic reactions like skin rash, itching or hives, swelling of the face, lips, or tongue  breathing problems  cough Side effects that usually do not require medical attention (report to your doctor or health care professional if they continue or are bothersome):  increased sensitivity to the sun  skin irritation This list may not describe all possible side effects. Call your doctor for medical advice about side effects. You may report side effects to FDA at 1-800-FDA-1088. Where should I keep my medicine? Keep out of the reach of children. Store at room temperature between 15 and 30 degrees C (59 and 86 degrees F). Store away from direct light and heat. Do not freeze. Throw away any unused medicine after the expiration date. NOTE: This sheet is a summary. It may not cover all possible information. If you have questions about this medicine, talk to your doctor, pharmacist, or health care provider.  2021 Elsevier/Gold Standard (2015-06-24 20:48:32)

## 2020-06-15 NOTE — H&P (View-Only) (Signed)
Justin Villa  06/15/2020  Body mass index is 29.84 kg/m.  ASSESSMENT AND PLAN:     Right foot fifth metatarsophalangeal joint infection possible osteomyelitis  Patient needs incision and drainage culture and bone biopsy  Surgery scheduled for right foot incision drainage right fifth metatarsophalangeal joint   Encounter Diagnosis  Name Primary?  . Pain in left foot Yes   Lab 1 white count was normal Lab 2  sed rate was normal Lab 3 C-reactive protein was elevated  Personal image interpretation Image 1 x-ray [facility] on Friday, January 7 of the foot shows periosteal erosion near the joint metatarsal head  Image 2 MRI [facility] January 7 shows abscess and infection and possible bony involvement  HISTORY SECTION :  Chief Complaint  Patient presents with  . Foot Pain    Left/ red painful swollen injury on 05/24/20   54 male no history of trauma presented to ER with 3-week history of increasing pain right foot x-rays and MRI was performed and indicated infection abscess possible osteomyelitis he presents for evaluation and treatment  Denies any trauma denies any history of gout denies any history of diabetes     Review of Systems  Constitutional: Positive for fever. Negative for chills.  Neurological: Negative for focal weakness.     has a past medical history of CAD (coronary artery disease) (11/27/2015), Carotid stenosis, Essential hypertension, History of non-ST elevation myocardial infarction (NSTEMI) (11/2015), and Hyperlipidemia.   Past Surgical History:  Procedure Laterality Date  . BACK SURGERY    . CARDIAC CATHETERIZATION N/A 11/26/2015   Procedure: Left Heart Cath and Coronary Angiography;  Surgeon: Henry W Smith, MD;  Location: [facility] INVASIVE CV LAB;  Service: Cardiovascular;  Laterality: N/A;  . CORONARY ARTERY BYPASS GRAFT N/A 11/27/2015   Procedure: CORONARY ARTERY BYPASS GRAFTING (CABG) x 5 (LIMA to LAD, SVG to DIAGONAL, SVG  SEQUENTIALLY to OM1 and OM2, SVG to PDA) with EVH from right leg using greater saphenous vein and mammary.;  Surgeon: Edward B Gerhardt, MD;  Location: [facility] OR;  Service: Open Heart Surgery;  Laterality: N/A;  . INTRAOPERATIVE TRANSESOPHAGEAL ECHOCARDIOGRAM N/A 11/27/2015   Procedure: INTRAOPERATIVE TRANSESOPHAGEAL ECHOCARDIOGRAM;  Surgeon: Edward B Gerhardt, MD;  Location: [facility] OR;  Service: Open Heart Surgery;  Laterality: N/A;  . KNEE SURGERY      Social History   Tobacco Use  . Smoking status: Never Smoker  . Smokeless tobacco: Never Used  Vaping Use  . Vaping Use: Never used  Substance Use Topics  . Alcohol use: Yes    Alcohol/week: 2.0 standard drinks    Types: 2 Shots of liquor per week    Comment: everyother day  . Drug use: No    Family History  Problem Relation Age of Onset  . Heart disease Father       No Known Allergies   Current Outpatient Medications:  .  aspirin EC 81 MG EC tablet, Take 1 tablet (81 mg total) by mouth daily., Disp: , Rfl:  .  atorvastatin (LIPITOR) 20 MG tablet, Take 1 tablet (20 mg total) by mouth daily., Disp: 90 tablet, Rfl: 0 .  cephALEXin (KEFLEX) 500 MG capsule, Take 1 capsule (500 mg total) by mouth 4 (four) times daily., Disp: 28 capsule, Rfl: 0 .  clopidogrel (PLAVIX) 75 MG tablet, Take 1 tablet (75 mg total) by mouth daily., Disp: 90 tablet, Rfl: 0 .  lisinopril (ZESTRIL) 40 MG tablet, Take 1 tablet (40 mg total) by mouth   daily., Disp: 90 tablet, Rfl: 0 .  metoprolol succinate (TOPROL XL) 25 MG 24 hr tablet, Take 1 tablet (25 mg total) by mouth daily., Disp: 90 tablet, Rfl: 0   PHYSICAL EXAM SECTION: BP 103/76   Pulse 70   Ht 6' (1.829 m)   Wt 220 lb (99.8 kg)   BMI 29.84 kg/m   Body mass index is 29.84 kg/m.   General appearance: Well-developed well-nourished no gross deformities  Eyes clear normal vision no evidence of conjunctivitis or jaundice, extraocular muscles intact  ENT: ears hearing normal, nasal passages clear,  throat clear   Lymph nodes: No lymphadenopathy  Neck is supple without palpable mass, full range of motion  Cardiovascular normal pulse and perfusion in all 4 extremities normal color without edema  Neurologically deep tendon reflexes are equal and normal, no sensation loss or deficits no pathologic reflexes  Psychological: Awake alert and oriented x3 mood and affect normal  Skin no lacerations or ulcerations no nodularity no palpable masses,  Musculoskeletal:   Right foot is red and swollen over the fifth metatarsophalangeal joint Tenderness especially plantar but globally around the metatarsal phalangeal joint Painful range of motion   12:14 PM

## 2020-06-15 NOTE — Telephone Encounter (Signed)
-----   Message from Doristine Section sent at 06/15/2020 12:22 PM EST ----- Regarding: Documention for Surgery shows LT and pt states RT Brynda Heick Carlin Attridge [phone] - pt noticed on AVS that it shows Left foot, and states it is Right foot for treatment and surgery

## 2020-06-15 NOTE — Progress Notes (Signed)
Justin Villa [facility]  06/15/2020  Body mass index is 29.84 kg/m.  ASSESSMENT AND PLAN:     Right foot fifth metatarsophalangeal joint infection possible osteomyelitis  Patient needs incision and drainage culture and bone biopsy  Surgery scheduled for right foot incision drainage right fifth metatarsophalangeal joint   Encounter Diagnosis  Name Primary?  . Pain in left foot Yes   Lab 1 white count was normal Lab 2  sed rate was normal Lab 3 C-reactive protein was elevated  Personal image interpretation Image 1 x-ray [facility] on Friday, January 7 of the foot shows periosteal erosion near the joint metatarsal head  Image 2 MRI [facility] January 7 shows abscess and infection and possible bony involvement  HISTORY SECTION :  Chief Complaint  Patient presents with  . Foot Pain    Left/ red painful swollen injury on 05/24/20   36 male no history of trauma presented to ER with 3-week history of increasing pain right foot x-rays and MRI was performed and indicated infection abscess possible osteomyelitis he presents for evaluation and treatment  Denies any trauma denies any history of gout denies any history of diabetes     Review of Systems  Constitutional: Positive for fever. Negative for chills.  Neurological: Negative for focal weakness.     has a past medical history of CAD (coronary artery disease) (11/27/2015), Carotid stenosis, Essential hypertension, History of non-ST elevation myocardial infarction (NSTEMI) (11/2015), and Hyperlipidemia.   Past Surgical History:  Procedure Laterality Date  . BACK SURGERY    . CARDIAC CATHETERIZATION N/A 11/26/2015   Procedure: Left Heart Cath and Coronary Angiography;  Surgeon: Lyn Records, MD;  Location: [facility] INVASIVE CV LAB;  Service: Cardiovascular;  Laterality: N/A;  . CORONARY ARTERY BYPASS GRAFT N/A 11/27/2015   Procedure: CORONARY ARTERY BYPASS GRAFTING (CABG) x 5 (LIMA to LAD, SVG to DIAGONAL, SVG  SEQUENTIALLY to OM1 and OM2, SVG to PDA) with EVH from right leg using greater saphenous vein and mammary.;  Surgeon: Delight Ovens, MD;  Location: [facility] OR;  Service: Open Heart Surgery;  Laterality: N/A;  . INTRAOPERATIVE TRANSESOPHAGEAL ECHOCARDIOGRAM N/A 11/27/2015   Procedure: INTRAOPERATIVE TRANSESOPHAGEAL ECHOCARDIOGRAM;  Surgeon: Delight Ovens, MD;  Location: [facility] OR;  Service: Open Heart Surgery;  Laterality: N/A;  . KNEE SURGERY      Social History   Tobacco Use  . Smoking status: Never Smoker  . Smokeless tobacco: Never Used  Vaping Use  . Vaping Use: Never used  Substance Use Topics  . Alcohol use: Yes    Alcohol/week: 2.0 standard drinks    Types: 2 Shots of liquor per week    Comment: everyother day  . Drug use: No    Family History  Problem Relation Age of Onset  . Heart disease Father       No Known Allergies   Current Outpatient Medications:  .  aspirin EC 81 MG EC tablet, Take 1 tablet (81 mg total) by mouth daily., Disp: , Rfl:  .  atorvastatin (LIPITOR) 20 MG tablet, Take 1 tablet (20 mg total) by mouth daily., Disp: 90 tablet, Rfl: 0 .  cephALEXin (KEFLEX) 500 MG capsule, Take 1 capsule (500 mg total) by mouth 4 (four) times daily., Disp: 28 capsule, Rfl: 0 .  clopidogrel (PLAVIX) 75 MG tablet, Take 1 tablet (75 mg total) by mouth daily., Disp: 90 tablet, Rfl: 0 .  lisinopril (ZESTRIL) 40 MG tablet, Take 1 tablet (40 mg total) by mouth  daily., Disp: 90 tablet, Rfl: 0 .  metoprolol succinate (TOPROL XL) 25 MG 24 hr tablet, Take 1 tablet (25 mg total) by mouth daily., Disp: 90 tablet, Rfl: 0   PHYSICAL EXAM SECTION: BP 103/76   Pulse 70   Ht 6' (1.829 m)   Wt 220 lb (99.8 kg)   BMI 29.84 kg/m   Body mass index is 29.84 kg/m.   General appearance: Well-developed well-nourished no gross deformities  Eyes clear normal vision no evidence of conjunctivitis or jaundice, extraocular muscles intact  ENT: ears hearing normal, nasal passages clear,  throat clear   Lymph nodes: No lymphadenopathy  Neck is supple without palpable mass, full range of motion  Cardiovascular normal pulse and perfusion in all 4 extremities normal color without edema  Neurologically deep tendon reflexes are equal and normal, no sensation loss or deficits no pathologic reflexes  Psychological: Awake alert and oriented x3 mood and affect normal  Skin no lacerations or ulcerations no nodularity no palpable masses,  Musculoskeletal:   Right foot is red and swollen over the fifth metatarsophalangeal joint Tenderness especially plantar but globally around the metatarsal phalangeal joint Painful range of motion   12:14 PM

## 2020-06-16 ENCOUNTER — Encounter (HOSPITAL_COMMUNITY)
Admission: RE | Disposition: A | Discharge status: Home / Self Care | Payer: Self-pay | Source: Home / Self Care | Attending: Orthopedic Surgery

## 2020-06-16 ENCOUNTER — Encounter (HOSPITAL_COMMUNITY): Payer: Self-pay | Admitting: Orthopedic Surgery

## 2020-06-16 ENCOUNTER — Ambulatory Visit (HOSPITAL_COMMUNITY): Payer: Self-pay | Admitting: Anesthesiology

## 2020-06-16 ENCOUNTER — Ambulatory Visit (HOSPITAL_COMMUNITY)
Admission: RE | Admit: 2020-06-16 | Discharge: 2020-06-16 | Disposition: A | Discharge status: Home / Self Care | Payer: Self-pay | Attending: Orthopedic Surgery | Admitting: Orthopedic Surgery

## 2020-06-16 ENCOUNTER — Other Ambulatory Visit: Payer: Self-pay

## 2020-06-16 DIAGNOSIS — I10 Essential (primary) hypertension: Secondary | ICD-10-CM | POA: Insufficient documentation

## 2020-06-16 DIAGNOSIS — Z79899 Other long term (current) drug therapy: Secondary | ICD-10-CM | POA: Insufficient documentation

## 2020-06-16 DIAGNOSIS — M009 Pyogenic arthritis, unspecified: Secondary | ICD-10-CM

## 2020-06-16 DIAGNOSIS — L02611 Cutaneous abscess of right foot: Secondary | ICD-10-CM | POA: Insufficient documentation

## 2020-06-16 DIAGNOSIS — I252 Old myocardial infarction: Secondary | ICD-10-CM | POA: Insufficient documentation

## 2020-06-16 DIAGNOSIS — Z951 Presence of aortocoronary bypass graft: Secondary | ICD-10-CM | POA: Insufficient documentation

## 2020-06-16 DIAGNOSIS — Z7982 Long term (current) use of aspirin: Secondary | ICD-10-CM | POA: Insufficient documentation

## 2020-06-16 DIAGNOSIS — Z8249 Family history of ischemic heart disease and other diseases of the circulatory system: Secondary | ICD-10-CM | POA: Insufficient documentation

## 2020-06-16 HISTORY — PX: INCISION AND DRAINAGE ABSCESS: SHX5864

## 2020-06-16 LAB — SARS CORONAVIRUS 2 (TAT 6-24 HRS): SARS Coronavirus 2: NEGATIVE

## 2020-06-16 SURGERY — INCISION AND DRAINAGE, ABSCESS
Anesthesia: General | Site: Fifth Toe | Laterality: Right

## 2020-06-16 MED ORDER — DEXAMETHASONE SODIUM PHOSPHATE 10 MG/ML IJ SOLN
INTRAMUSCULAR | Status: AC
  Filled 2020-06-16: qty 1

## 2020-06-16 MED ORDER — FENTANYL CITRATE (PF) 100 MCG/2ML IJ SOLN
INTRAMUSCULAR | Status: AC
  Filled 2020-06-16: qty 2

## 2020-06-16 MED ORDER — SEVOFLURANE IN SOLN
RESPIRATORY_TRACT | Status: AC
  Filled 2020-06-16: qty 250

## 2020-06-16 MED ORDER — ONDANSETRON HCL 4 MG/2ML IJ SOLN
4.0000 mg | Freq: Once | INTRAMUSCULAR | Status: AC
  Administered 2020-06-16: 4 mg via INTRAVENOUS

## 2020-06-16 MED ORDER — MEPERIDINE HCL 50 MG/ML IJ SOLN: 6.2500 mg | INTRAMUSCULAR | Status: DC | PRN

## 2020-06-16 MED ORDER — ONDANSETRON HCL 4 MG/2ML IJ SOLN
INTRAMUSCULAR | Status: AC
  Filled 2020-06-16: qty 2

## 2020-06-16 MED ORDER — IBUPROFEN 800 MG PO TABS
ORAL_TABLET | ORAL | Status: AC
  Filled 2020-06-16: qty 1

## 2020-06-16 MED ORDER — LACTATED RINGERS IV SOLN: INTRAVENOUS | Status: DC | PRN

## 2020-06-16 MED ORDER — LIDOCAINE HCL (PF) 2 % IJ SOLN
INTRAMUSCULAR | Status: AC
  Filled 2020-06-16: qty 10

## 2020-06-16 MED ORDER — BUPIVACAINE HCL (PF) 0.5 % IJ SOLN
INTRAMUSCULAR | Status: AC
  Filled 2020-06-16: qty 30

## 2020-06-16 MED ORDER — DEXAMETHASONE SODIUM PHOSPHATE 10 MG/ML IJ SOLN
INTRAMUSCULAR | Status: DC | PRN
  Administered 2020-06-16: 10 mg via INTRAVENOUS

## 2020-06-16 MED ORDER — HYDROMORPHONE HCL 1 MG/ML IJ SOLN: 0.2500 mg | INTRAMUSCULAR | Status: DC | PRN

## 2020-06-16 MED ORDER — CHLORHEXIDINE GLUCONATE 0.12 % MT SOLN
OROMUCOSAL | Status: AC
  Filled 2020-06-16: qty 15

## 2020-06-16 MED ORDER — PROMETHAZINE HCL 25 MG/ML IJ SOLN: 6.2500 mg | INTRAMUSCULAR | Status: DC | PRN

## 2020-06-16 MED ORDER — BUPIVACAINE HCL (PF) 0.5 % IJ SOLN
INTRAMUSCULAR | Status: DC | PRN
Start: 2020-06-16 — End: 2020-06-16
  Administered 2020-06-16: 20 mL

## 2020-06-16 MED ORDER — IBUPROFEN 800 MG PO TABS
800.0000 mg | ORAL_TABLET | Freq: Once | ORAL | Status: AC
  Administered 2020-06-16: 800 mg via ORAL

## 2020-06-16 MED ORDER — OXYCODONE-ACETAMINOPHEN 5-325 MG PO TABS: 1.0000 | ORAL_TABLET | ORAL | 0 refills | Status: AC | PRN

## 2020-06-16 MED ORDER — CHLORHEXIDINE GLUCONATE 0.12 % MT SOLN
15.0000 mL | Freq: Once | OROMUCOSAL | Status: AC
  Administered 2020-06-16: 15 mL via OROMUCOSAL

## 2020-06-16 MED ORDER — OXYCODONE HCL 5 MG PO TABS
5.0000 mg | ORAL_TABLET | Freq: Once | ORAL | Status: AC
  Administered 2020-06-16: 5 mg via ORAL

## 2020-06-16 MED ORDER — CEFAZOLIN SODIUM-DEXTROSE 2-4 GM/100ML-% IV SOLN
2.0000 g | INTRAVENOUS | Status: AC
  Administered 2020-06-16: 2 g via INTRAVENOUS

## 2020-06-16 MED ORDER — OXYCODONE HCL 5 MG PO TABS
ORAL_TABLET | ORAL | Status: AC
  Filled 2020-06-16: qty 1

## 2020-06-16 MED ORDER — ORAL CARE MOUTH RINSE: 15.0000 mL | Freq: Once | OROMUCOSAL | Status: AC

## 2020-06-16 MED ORDER — PROPOFOL 10 MG/ML IV BOLUS
INTRAVENOUS | Status: AC
  Filled 2020-06-16: qty 40

## 2020-06-16 MED ORDER — DEXMEDETOMIDINE (PRECEDEX) IN NS 20 MCG/5ML (4 MCG/ML) IV SYRINGE
PREFILLED_SYRINGE | INTRAVENOUS | Status: AC
  Filled 2020-06-16: qty 5

## 2020-06-16 MED ORDER — PROPOFOL 10 MG/ML IV BOLUS
INTRAVENOUS | Status: AC
  Filled 2020-06-16: qty 20

## 2020-06-16 MED ORDER — MIDAZOLAM HCL 5 MG/5ML IJ SOLN
INTRAMUSCULAR | Status: DC | PRN
  Administered 2020-06-16: 2 mg via INTRAVENOUS

## 2020-06-16 MED ORDER — IBUPROFEN 800 MG PO TABS: 800.0000 mg | ORAL_TABLET | Freq: Three times a day (TID) | ORAL | 1 refills | Status: DC | PRN

## 2020-06-16 MED ORDER — ONDANSETRON HCL 4 MG/2ML IJ SOLN
INTRAMUSCULAR | Status: DC | PRN
  Administered 2020-06-16: 4 mg via INTRAVENOUS

## 2020-06-16 MED ORDER — PROPOFOL 10 MG/ML IV BOLUS
INTRAVENOUS | Status: DC | PRN
  Administered 2020-06-16: 250 mg via INTRAVENOUS
  Administered 2020-06-16 (×2): 50 mg via INTRAVENOUS

## 2020-06-16 MED ORDER — SODIUM CHLORIDE 0.9 % IR SOLN
Status: DC | PRN
  Administered 2020-06-16: 1000 mL

## 2020-06-16 MED ORDER — CEFAZOLIN SODIUM-DEXTROSE 2-4 GM/100ML-% IV SOLN
INTRAVENOUS | Status: AC
  Filled 2020-06-16: qty 100

## 2020-06-16 MED ORDER — FENTANYL CITRATE (PF) 100 MCG/2ML IJ SOLN
INTRAMUSCULAR | Status: DC | PRN
  Administered 2020-06-16 (×2): 100 ug via INTRAVENOUS

## 2020-06-16 MED ORDER — DEXMEDETOMIDINE (PRECEDEX) IN NS 20 MCG/5ML (4 MCG/ML) IV SYRINGE
PREFILLED_SYRINGE | INTRAVENOUS | Status: DC | PRN
  Administered 2020-06-16: 20 ug via INTRAVENOUS

## 2020-06-16 MED ORDER — CEPHALEXIN 500 MG PO CAPS: 500.0000 mg | ORAL_CAPSULE | Freq: Four times a day (QID) | ORAL | 1 refills | Status: DC

## 2020-06-16 MED ORDER — MIDAZOLAM HCL 2 MG/2ML IJ SOLN
INTRAMUSCULAR | Status: AC
  Filled 2020-06-16: qty 2

## 2020-06-16 MED ORDER — LACTATED RINGERS IV SOLN: Freq: Once | INTRAVENOUS | Status: AC

## 2020-06-16 SURGICAL SUPPLY — 47 items
BANDAGE ELASTIC 4 VELCRO NS (GAUZE/BANDAGES/DRESSINGS) ×2 IMPLANT
BANDAGE ESMARK 4X12 BL STRL LF (DISPOSABLE) ×1 IMPLANT
BNDG CMPR 12X4 ELC STRL LF (DISPOSABLE) ×1
BNDG CMPR STD VLCR NS LF 5.8X4 (GAUZE/BANDAGES/DRESSINGS) ×1
BNDG COHESIVE 2X5 TAN STRL LF (GAUZE/BANDAGES/DRESSINGS) IMPLANT
BNDG CONFORM 2 STRL LF (GAUZE/BANDAGES/DRESSINGS) ×2 IMPLANT
BNDG ELASTIC 4X5.8 VLCR NS LF (GAUZE/BANDAGES/DRESSINGS) ×2 IMPLANT
BNDG ESMARK 4X12 BLUE STRL LF (DISPOSABLE) ×2
CLOTH BEACON ORANGE TIMEOUT ST (SAFETY) ×2 IMPLANT
CNTNR URN SCR LID CUP LEK RST (MISCELLANEOUS) ×1 IMPLANT
CONT SPEC 4OZ STRL OR WHT (MISCELLANEOUS) ×2
COVER LIGHT HANDLE STERIS (MISCELLANEOUS) ×4 IMPLANT
COVER WAND RF STERILE (DRAPES) ×2 IMPLANT
CUFF TOURN SGL QUICK 34 (TOURNIQUET CUFF) ×2
CUFF TRNQT CYL 34X4.125X (TOURNIQUET CUFF) ×1 IMPLANT
DECANTER SPIKE VIAL GLASS SM (MISCELLANEOUS) ×2 IMPLANT
DRAPE ORTHO 2.5IN SPLIT 77X108 (DRAPES) IMPLANT
DRAPE ORTHO SPLIT 77X108 STRL (DRAPES)
DRSG XEROFORM 1X8 (GAUZE/BANDAGES/DRESSINGS) ×2 IMPLANT
ELECT REM PT RETURN 9FT ADLT (ELECTROSURGICAL) ×2
ELECTRODE REM PT RTRN 9FT ADLT (ELECTROSURGICAL) ×1 IMPLANT
GAUZE KERLIX 2X3 DERM STRL LF (GAUZE/BANDAGES/DRESSINGS) ×2 IMPLANT
GAUZE PACKING IODOFORM 1/4X15 (PACKING) ×2 IMPLANT
GAUZE SPONGE 4X4 12PLY STRL (GAUZE/BANDAGES/DRESSINGS) ×2 IMPLANT
GLOVE BIOGEL PI IND STRL 7.0 (GLOVE) ×1 IMPLANT
GLOVE BIOGEL PI INDICATOR 7.0 (GLOVE) ×1
GLOVE OPTIFIT SS 8.0 STRL (GLOVE) ×2 IMPLANT
GLOVE SKINSENSE NS SZ8.0 LF (GLOVE) ×1
GLOVE SKINSENSE STRL SZ8.0 LF (GLOVE) ×1 IMPLANT
GOWN STRL REUS W/TWL LRG LVL3 (GOWN DISPOSABLE) ×2 IMPLANT
GOWN STRL REUS W/TWL XL LVL3 (GOWN DISPOSABLE) ×2 IMPLANT
KIT TURNOVER KIT A (KITS) ×2 IMPLANT
MANIFOLD NEPTUNE II (INSTRUMENTS) ×2 IMPLANT
MARKER SKIN DUAL TIP RULER LAB (MISCELLANEOUS) ×2 IMPLANT
NEEDLE HYPO 22GX1.5 SAFETY (NEEDLE) ×2 IMPLANT
NS IRRIG 1000ML POUR BTL (IV SOLUTION) ×2 IMPLANT
PACK BASIC LIMB (CUSTOM PROCEDURE TRAY) ×2 IMPLANT
PAD ABD 5X9 TENDERSORB (GAUZE/BANDAGES/DRESSINGS) IMPLANT
PAD ARMBOARD 7.5X6 YLW CONV (MISCELLANEOUS) ×2 IMPLANT
SET BASIN LINEN APH (SET/KITS/TRAYS/PACK) ×2 IMPLANT
SUT ETHILON 3 0 FSL (SUTURE) ×2 IMPLANT
SUT MON AB 2-0 SH 27 (SUTURE) ×2
SUT MON AB 2-0 SH27 (SUTURE) ×1 IMPLANT
SWAB CULTURE ESWAB REG 1ML (MISCELLANEOUS) ×2 IMPLANT
SWAB CULTURE LIQ STUART DBL (MISCELLANEOUS) ×2 IMPLANT
SYR BULB IRRIG 60ML STRL (SYRINGE) ×2 IMPLANT
SYR CONTROL 10ML LL (SYRINGE) ×2 IMPLANT

## 2020-06-16 NOTE — Brief Op Note (Signed)
06/16/2020  1:49 PM  PATIENT:  Justin Villa  55 y.o. male  PRE-OPERATIVE DIAGNOSIS:  Abscess right small toe, fifth digit  POST-OPERATIVE DIAGNOSIS:  Abscess right small toe, fifth digit  PROCEDURE:  Procedure(s): INCISION AND DRAINAGE ABSCESS RIGHT SMALL TOE right foot With bone biopsy for culture   (5th digit) (Right) foot  SURGEON:  Surgeon(s) and Role:    Vickki Hearing, MD - Primary  Operative findings purulence was noted in the soft tissue.  The joint was opened and there was no purulence in the joint however the distal portion of the metatarsal head was very soft and although the bone looks good the consistency was not  Procedure was done as follows  Mr. Poage was seen in preop site was confirmed and marked chart was reviewed patient was taken to surgery.  General anesthesia was administered.  Right foot was prepped with Betadine.  After sterile draping timeout was completed  The limb was exsanguinated with a 6 inch Esmarch tourniquet was elevated to 250 mmHg.  I made a straight incision over the metatarsophalangeal joint carried it down to the extensor tendon.  Purulence was noted in the subcutaneous tissue.  This was cultured.  This was irrigated and debrided.  I then made an incision in the extensor hood near the metatarsophalangeal joint and opened up the joint.  There was no purulence in the joint.  It was irrigated.  The bone was very soft and of poor contour although the color was good  I took a bone biopsy and sent it for culture along with the cultures that were taken when the wound was opened  I closed the extensor hood with 2 oh Monocryl interrupted suture, followed by 2-0 Monocryl in the subcu over 1/4 inch iodoform gauze and then interrupted nylon sutures 3-0 nylon for closure.  We injected around the toe and did a field block with 20 cc of Marcaine plain half percent  Sterile dressing was applied tourniquet was released patient was extubated patient  taken recovery in stable condition  Plan for postop care  Hard soled shoe weight-bear as tolerated through the heel assist device as needed  Keep elevated  Continue Keflex 500 mg every 6  Change antibiotics if culture and bone biopsy warrant   PHYSICIAN ASSISTANT: NONE   ASSISTANTS: none   ANESTHESIA:   general  EBL:  5 mL   BLOOD ADMINISTERED:none  DRAINS: none   LOCAL MEDICATIONS USED:  MARCAINE     SPECIMEN:  Source of Specimen:  Specimen #1 anaerobic and aerobic culture swabSpecimen #2 metatarsal head biopsy right foot fifth digit  DISPOSITION OF SPECIMEN:  Microbiology  COUNTS:  YES  TOURNIQUET:   Total Tourniquet Time Documented: Thigh (Right) - 34 minutes Total: Thigh (Right) - 34 minutes   DICTATION: .Reubin Milan Dictation  PLAN OF CARE: Discharge to home after PACU  PATIENT DISPOSITION:  PACU - hemodynamically stable.   Delay start of Pharmacological VTE agent (>24hrs) due to surgical blood loss or risk of bleeding: not applicable

## 2020-06-16 NOTE — Transfer of Care (Signed)
Immediate Anesthesia Transfer of Care Note  Patient: Justin Villa  Procedure(s) Performed: INCISION AND DRAINAGE ABSCESS RIGHT PINKY TOE (5th toe) (Right Fifth Toe)  Patient Location: PACU  Anesthesia Type:General  Level of Consciousness: awake, alert , oriented and patient cooperative  Airway & Oxygen Therapy: Patient Spontanous Breathing and Patient connected to face mask oxygen  Post-op Assessment: Report given to RN and Post -op Vital signs reviewed and stable  Post vital signs: Reviewed and stable  Last Vitals:  Vitals Value Taken Time  BP 111/66 06/16/20 1349  Temp    Pulse 77 06/16/20 1351  Resp 16 06/16/20 1351  SpO2 99 % 06/16/20 1351  Vitals shown include unvalidated device data.  Last Pain:  Vitals:   06/16/20 1145  TempSrc: Oral  PainSc: 7       Patients Stated Pain Goal: 5 (06/16/20 1145)  Complications: No complications documented.

## 2020-06-16 NOTE — Anesthesia Preprocedure Evaluation (Signed)
Anesthesia Evaluation  Patient identified by MRN, date of birth, ID band Patient awake    Reviewed: Allergy & Precautions, NPO status , Patient's Chart, lab work & pertinent test results, reviewed documented beta blocker date and time   Airway Mallampati: III  TM Distance: >3 FB Neck ROM: Full    Dental  (+) Dental Advisory Given, Teeth Intact, Caps   Pulmonary neg pulmonary ROS,    Pulmonary exam normal breath sounds clear to auscultation       Cardiovascular Exercise Tolerance: Good hypertension, Pt. on medications and Pt. on home beta blockers + CAD, + Past MI and + CABG (2017)  Normal cardiovascular exam Rhythm:Regular Rate:Normal     Neuro/Psych negative neurological ROS  negative psych ROS   GI/Hepatic negative GI ROS, Neg liver ROS,   Endo/Other  negative endocrine ROS  Renal/GU negative Renal ROS     Musculoskeletal negative musculoskeletal ROS (+)   Abdominal   Peds  Hematology negative hematology ROS (+)   Anesthesia Other Findings   Reproductive/Obstetrics                             Anesthesia Physical Anesthesia Plan  ASA: III  Anesthesia Plan: General   Post-op Pain Management:    Induction: Intravenous  PONV Risk Score and Plan: 3  Airway Management Planned: LMA  Additional Equipment:   Intra-op Plan:   Post-operative Plan: Extubation in OR  Informed Consent: I have reviewed the patients History and Physical, chart, labs and discussed the procedure including the risks, benefits and alternatives for the proposed anesthesia with the patient or authorized representative who has indicated his/her understanding and acceptance.     Dental advisory given  Plan Discussed with: CRNA and Surgeon  Anesthesia Plan Comments:         Anesthesia Quick Evaluation

## 2020-06-16 NOTE — Anesthesia Postprocedure Evaluation (Signed)
Anesthesia Post Note  Patient: KAIYAN LUCZAK  Procedure(s) Performed: INCISION AND DRAINAGE ABSCESS RIGHT PINKY TOE (5th toe) (Right Fifth Toe)  Patient location during evaluation: PACU Anesthesia Type: General Level of consciousness: awake, oriented, awake and alert and patient cooperative Pain management: satisfactory to patient Vital Signs Assessment: post-procedure vital signs reviewed and stable Respiratory status: spontaneous breathing, respiratory function stable, nonlabored ventilation and patient connected to face mask oxygen Cardiovascular status: stable Postop Assessment: no apparent nausea or vomiting Anesthetic complications: no   No complications documented.   Last Vitals:  Vitals:   06/16/20 1145  BP: (!) 144/93  Pulse: 65  Resp: 16  Temp: 36.5 C  SpO2: 100%    Last Pain:  Vitals:   06/16/20 1145  TempSrc: Oral  PainSc: 7                  Ozell Juhasz

## 2020-06-16 NOTE — Interval H&P Note (Signed)
History and Physical Interval Note:  06/16/2020 12:34 PM  Justin Villa  has presented today for surgery, with the diagnosis of Abscess right pinky toe (5th toe).  The various methods of treatment have been discussed with the patient and family. After consideration of risks, benefits and other options for treatment, the patient has consented to  Procedure(s): INCISION AND DRAINAGE ABSCESS RIGHT PINKY TOE (Right) as a surgical intervention.  The patient's history has been reviewed, patient examined, no change in status, stable for surgery.  I have reviewed the patient's chart and labs.  Questions were answered to the patient's satisfaction.     Fuller Canada

## 2020-06-16 NOTE — Brief Op Note (Signed)
06/16/2020  1:43 PM  PATIENT:  Justin Villa  55 y.o. male  PRE-OPERATIVE DIAGNOSIS:  Abscess right small toe, fifth digit  POST-OPERATIVE DIAGNOSIS:  Abscess right small toe, fifth digit  PROCEDURE:  Procedure(s): INCISION AND DRAINAGE ABSCESS RIGHT SMALL TOE right foot With bone biopsy for culture   (5th digit) (Right) foot  SURGEON:  Surgeon(s) and Role:    * Isabela Nardelli E, MD - Primary  Operative findings purulence was noted in the soft tissue.  The joint was opened and there was no purulence in the joint however the distal portion of the metatarsal head was very soft and although the bone looks good the consistency was not  Procedure was done as follows  Mr. Ormond was seen in preop site was confirmed and marked chart was reviewed patient was taken to surgery.  General anesthesia was administered.  Right foot was prepped with Betadine.  After sterile draping timeout was completed  The limb was exsanguinated with a 6 inch Esmarch tourniquet was elevated to 250 mmHg.  I made a straight incision over the metatarsophalangeal joint carried it down to the extensor tendon.  Purulence was noted in the subcutaneous tissue.  This was cultured.  This was irrigated and debrided.  I then made an incision in the extensor hood near the metatarsophalangeal joint and opened up the joint.  There was no purulence in the joint.  It was irrigated.  The bone was very soft and of poor contour although the color was good  I took a bone biopsy and sent it for culture along with the cultures that were taken when the wound was opened  I closed the extensor hood with 2 oh Monocryl interrupted suture, followed by 2-0 Monocryl in the subcu over 1/4 inch iodoform gauze and then interrupted nylon sutures 3-0 nylon for closure.  We injected around the toe and did a field block with 20 cc of Marcaine plain half percent  Sterile dressing was applied tourniquet was released patient was extubated patient  taken recovery in stable condition  Plan for postop care  Hard soled shoe weight-bear as tolerated through the heel assist device as needed  Keep elevated  Continue Keflex 500 mg every 6  Change antibiotics if culture and bone biopsy warrant   PHYSICIAN ASSISTANT: NONE   ASSISTANTS: none   ANESTHESIA:   general  EBL:  5 mL   BLOOD ADMINISTERED:none  DRAINS: none   LOCAL MEDICATIONS USED:  MARCAINE     SPECIMEN:  Source of Specimen:  Specimen #1 anaerobic and aerobic culture swabSpecimen #2 metatarsal head biopsy right foot fifth digit  DISPOSITION OF SPECIMEN:  Microbiology  COUNTS:  YES  TOURNIQUET:   Total Tourniquet Time Documented: Thigh (Right) - 34 minutes Total: Thigh (Right) - 34 minutes   DICTATION: .Dragon Dictation  PLAN OF CARE: Discharge to home after PACU  PATIENT DISPOSITION:  PACU - hemodynamically stable.   Delay start of Pharmacological VTE agent (>24hrs) due to surgical blood loss or risk of bleeding: not applicable  

## 2020-06-16 NOTE — Op Note (Signed)
06/16/2020  1:43 PM  PATIENT:  Justin Villa  55 y.o. male  PRE-OPERATIVE DIAGNOSIS:  Abscess right small toe, fifth digit  POST-OPERATIVE DIAGNOSIS:  Abscess right small toe, fifth digit  PROCEDURE:  Procedure(s): INCISION AND DRAINAGE ABSCESS RIGHT SMALL TOE right foot With bone biopsy for culture   (5th digit) (Right) foot  SURGEON:  Surgeon(s) and Role:    Vickki Hearing, MD - Primary  Operative findings purulence was noted in the soft tissue.  The joint was opened and there was no purulence in the joint however the distal portion of the metatarsal head was very soft and although the bone looks good the consistency was not  Procedure was done as follows  Justin Villa was seen in preop site was confirmed and marked chart was reviewed patient was taken to surgery.  General anesthesia was administered.  Right foot was prepped with Betadine.  After sterile draping timeout was completed  The limb was exsanguinated with a 6 inch Esmarch tourniquet was elevated to 250 mmHg.  I made a straight incision over the metatarsophalangeal joint carried it down to the extensor tendon.  Purulence was noted in the subcutaneous tissue.  This was cultured.  This was irrigated and debrided.  I then made an incision in the extensor hood near the metatarsophalangeal joint and opened up the joint.  There was no purulence in the joint.  It was irrigated.  The bone was very soft and of poor contour although the color was good  I took a bone biopsy and sent it for culture along with the cultures that were taken when the wound was opened  I closed the extensor hood with 2 oh Monocryl interrupted suture, followed by 2-0 Monocryl in the subcu over 1/4 inch iodoform gauze and then interrupted nylon sutures 3-0 nylon for closure.  We injected around the toe and did a field block with 20 cc of Marcaine plain half percent  Sterile dressing was applied tourniquet was released patient was extubated patient  taken recovery in stable condition  Plan for postop care  Hard soled shoe weight-bear as tolerated through the heel assist device as needed  Keep elevated  Continue Keflex 500 mg every 6  Change antibiotics if culture and bone biopsy warrant   PHYSICIAN ASSISTANT: NONE   ASSISTANTS: none   ANESTHESIA:   general  EBL:  5 mL   BLOOD ADMINISTERED:none  DRAINS: none   LOCAL MEDICATIONS USED:  MARCAINE     SPECIMEN:  Source of Specimen:  Specimen #1 anaerobic and aerobic culture swabSpecimen #2 metatarsal head biopsy right foot fifth digit  DISPOSITION OF SPECIMEN:  Microbiology  COUNTS:  YES  TOURNIQUET:   Total Tourniquet Time Documented: Thigh (Right) - 34 minutes Total: Thigh (Right) - 34 minutes   DICTATION: .Reubin Milan Dictation  PLAN OF CARE: Discharge to home after PACU  PATIENT DISPOSITION:  PACU - hemodynamically stable.   Delay start of Pharmacological VTE agent (>24hrs) due to surgical blood loss or risk of bleeding: not applicable

## 2020-06-16 NOTE — Discharge Instructions (Signed)
Monitored Anesthesia Care, Care After This sheet gives you information about how to care for yourself after your procedure. Your health care provider may also give you more specific instructions. If you have problems or questions, contact your health care provider. What can I expect after the procedure? After the procedure, it is common to have:  Tiredness.  Forgetfulness about what happened after the procedure.  Impaired judgment for important decisions.  Nausea or vomiting.  Some difficulty with balance. Follow these instructions at home: For the time period you were told by your health care provider:  Rest as needed.  Do not participate in activities where you could fall or become injured.  Do not drive or use machinery.  Do not drink alcohol.  Do not take sleeping pills or medicines that cause drowsiness.  Do not make important decisions or sign legal documents.  Do not take care of children on your own.      Eating and drinking  Follow the diet that is recommended by your health care provider.  Drink enough fluid to keep your urine pale yellow.  If you vomit: ? Drink water, juice, or soup when you can drink without vomiting. ? Make sure you have little or no nausea before eating solid foods. General instructions  Have a responsible adult stay with you for the time you are told. It is important to have someone help care for you until you are awake and alert.  Take over-the-counter and prescription medicines only as told by your health care provider.  If you have sleep apnea, surgery and certain medicines can increase your risk for breathing problems. Follow instructions from your health care provider about wearing your sleep device: ? Anytime you are sleeping, including during daytime naps. ? While taking prescription pain medicines, sleeping medicines, or medicines that make you drowsy.  Avoid smoking.  Keep all follow-up visits as told by your health care  provider. This is important. Contact a health care provider if:  You keep feeling nauseous or you keep vomiting.  You feel light-headed.  You are still sleepy or having trouble with balance after 24 hours.  You develop a rash.  You have a fever.  You have redness or swelling around the IV site. Get help right away if:  You have trouble breathing.  You have new-onset confusion at home. Summary  For several hours after your procedure, you may feel tired. You may also be forgetful and have poor judgment.  Have a responsible adult stay with you for the time you are told. It is important to have someone help care for you until you are awake and alert.  Rest as told. Do not drive or operate machinery. Do not drink alcohol or take sleeping pills.  Get help right away if you have trouble breathing, or if you suddenly become confused. This information is not intended to replace advice given to you by your health care provider. Make sure you discuss any questions you have with your health care provider. Document Revised: 02/06/2020 Document Reviewed: 04/25/2019 Elsevier Patient Education  2021 [employer]  

## 2020-06-16 NOTE — Brief Op Note (Signed)
06/16/2020  1:49 PM  PATIENT:  Justin Villa  55 y.o. male  PRE-OPERATIVE DIAGNOSIS:  Abscess right small toe, fifth digit  POST-OPERATIVE DIAGNOSIS:  Abscess right small toe, fifth digit  PROCEDURE:  Procedure(s): INCISION AND DRAINAGE ABSCESS RIGHT SMALL TOE right foot With bone biopsy for culture   (5th digit) (Right) foot  SURGEON:  Surgeon(s) and Role:    * Filomena Pokorney E, MD - Primary  Operative findings purulence was noted in the soft tissue.  The joint was opened and there was no purulence in the joint however the distal portion of the metatarsal head was very soft and although the bone looks good the consistency was not  Procedure was done as follows  Mr. Wolfley was seen in preop site was confirmed and marked chart was reviewed patient was taken to surgery.  General anesthesia was administered.  Right foot was prepped with Betadine.  After sterile draping timeout was completed  The limb was exsanguinated with a 6 inch Esmarch tourniquet was elevated to 250 mmHg.  I made a straight incision over the metatarsophalangeal joint carried it down to the extensor tendon.  Purulence was noted in the subcutaneous tissue.  This was cultured.  This was irrigated and debrided.  I then made an incision in the extensor hood near the metatarsophalangeal joint and opened up the joint.  There was no purulence in the joint.  It was irrigated.  The bone was very soft and of poor contour although the color was good  I took a bone biopsy and sent it for culture along with the cultures that were taken when the wound was opened  I closed the extensor hood with 2 oh Monocryl interrupted suture, followed by 2-0 Monocryl in the subcu over 1/4 inch iodoform gauze and then interrupted nylon sutures 3-0 nylon for closure.  We injected around the toe and did a field block with 20 cc of Marcaine plain half percent  Sterile dressing was applied tourniquet was released patient was extubated patient  taken recovery in stable condition  Plan for postop care  Hard soled shoe weight-bear as tolerated through the heel assist device as needed  Keep elevated  Continue Keflex 500 mg every 6  Change antibiotics if culture and bone biopsy warrant   PHYSICIAN ASSISTANT: NONE   ASSISTANTS: none   ANESTHESIA:   general  EBL:  5 mL   BLOOD ADMINISTERED:none  DRAINS: none   LOCAL MEDICATIONS USED:  MARCAINE     SPECIMEN:  Source of Specimen:  Specimen #1 anaerobic and aerobic culture swabSpecimen #2 metatarsal head biopsy right foot fifth digit  DISPOSITION OF SPECIMEN:  Microbiology  COUNTS:  YES  TOURNIQUET:   Total Tourniquet Time Documented: Thigh (Right) - 34 minutes Total: Thigh (Right) - 34 minutes   DICTATION: .Dragon Dictation  PLAN OF CARE: Discharge to home after PACU  PATIENT DISPOSITION:  PACU - hemodynamically stable.   Delay start of Pharmacological VTE agent (>24hrs) due to surgical blood loss or risk of bleeding: not applicable  

## 2020-06-16 NOTE — Anesthesia Procedure Notes (Signed)
Procedure Name: LMA Insertion Date/Time: 06/16/2020 12:53 PM Performed by: Shanon Payor, CRNA Pre-anesthesia Checklist: Patient identified, Emergency Drugs available, Suction available, Patient being monitored and Timeout performed Patient Re-evaluated:Patient Re-evaluated prior to induction Oxygen Delivery Method: Circle system utilized Preoxygenation: Pre-oxygenation with 100% oxygen Induction Type: IV induction LMA: LMA inserted LMA Size: 5.0 Number of attempts: 1 Placement Confirmation: positive ETCO2 and breath sounds checked- equal and bilateral Tube secured with: Tape Dental Injury: Teeth and Oropharynx as per pre-operative assessment

## 2020-06-18 ENCOUNTER — Ambulatory Visit (INDEPENDENT_AMBULATORY_CARE_PROVIDER_SITE_OTHER): Payer: Self-pay | Admitting: Orthopedic Surgery

## 2020-06-18 ENCOUNTER — Other Ambulatory Visit: Payer: Self-pay

## 2020-06-18 ENCOUNTER — Encounter (HOSPITAL_COMMUNITY): Payer: Self-pay | Admitting: Orthopedic Surgery

## 2020-06-18 DIAGNOSIS — L03031 Cellulitis of right toe: Secondary | ICD-10-CM

## 2020-06-18 DIAGNOSIS — Z9889 Other specified postprocedural states: Secondary | ICD-10-CM

## 2020-06-18 MED ORDER — LEVOFLOXACIN 250 MG PO TABS: 250.0000 mg | ORAL_TABLET | Freq: Every day | ORAL | 0 refills | Status: DC

## 2020-06-18 NOTE — Patient Instructions (Signed)
Continue to elevate the foot when not walking  Continue Keflex and add Levaquin

## 2020-06-18 NOTE — Progress Notes (Signed)
POST OP VISIT   Chief Complaint  Patient presents with  . Post-op Follow-up    06/16/20 right foot I and D     Encounter Diagnoses  Name Primary?  . S/P foot surgery, right I and D 06/16/20  Yes  . Cellulitis of fifth toe of right foot      POV # 1  DOS 06/16/2020  Postop day #3  OP NOTE  PRE-OPERATIVE DIAGNOSIS:  Abscess right small toe, fifth digit  POST-OPERATIVE DIAGNOSIS:  Abscess right small toe, fifth digit  PROCEDURE:  Procedure(s): INCISION AND DRAINAGE ABSCESS RIGHT SMALL TOE right foot With bone biopsy for culture   (5th digit) (Right) foot  SURGEON:  Surgeon(s) and Role:    Vickki Hearing, MD - Primary  Operative findings purulence was noted in the soft tissue.  The joint was opened and there was no purulence in the joint however the distal portion of the metatarsal head was very soft and although the bone looks good the consistency was not   Plan for postop care  Hard soled shoe weight-bear as tolerated through the heel assist device as needed  Keep elevated  Continue Keflex 500 mg every 6  Change antibiotics if culture and bone biopsy warrant    DATA: Drain was removed today pain is improved still has some surrounding erythema  OBJECTIVE FINDINGS :   Preliminary lab reports indicate Pseudomonas as the organism  ASSESSMENT AND PLAN   Presumed Pseudomonas infection final culture and sensitivity pending  Continue Keflex and Levaquin  Meds ordered this encounter  Medications  . levofloxacin (LEVAQUIN) 250 MG tablet    Sig: Take 1 tablet (250 mg total) by mouth daily.    Dispense:  28 tablet    Refill:  0

## 2020-06-21 LAB — AEROBIC/ANAEROBIC CULTURE W GRAM STAIN (SURGICAL/DEEP WOUND)

## 2020-06-24 ENCOUNTER — Ambulatory Visit (INDEPENDENT_AMBULATORY_CARE_PROVIDER_SITE_OTHER): Payer: Self-pay | Admitting: Orthopedic Surgery

## 2020-06-24 ENCOUNTER — Encounter: Payer: Self-pay | Admitting: Orthopedic Surgery

## 2020-06-24 ENCOUNTER — Other Ambulatory Visit: Payer: Self-pay

## 2020-06-24 DIAGNOSIS — Z9889 Other specified postprocedural states: Secondary | ICD-10-CM

## 2020-06-24 NOTE — Patient Instructions (Signed)
Stop keflex  Continue levaquin

## 2020-06-24 NOTE — Progress Notes (Signed)
  Chief Complaint  Patient presents with  . Post-op Follow-up    06/16/20 right foot I and D some bloody drainage decreased swelling redness/ tolerating antibiotics okay     Postop day #8 status post incision and drainage of right foot including the metatarsophalangeal joint  Culture came back positive for Pseudomonas with excellent sensitivity to ciprofloxacin   Patient come back on the 24th to see if we can take his sutures out.  He still has some bloody drainage status post removal of drain suture line looks a little red but overall looks better patient noting less pain than he had prior to surgery  Continue Levaquin he can stop taking Keflex

## 2020-06-29 ENCOUNTER — Ambulatory Visit: Payer: Self-pay | Admitting: Orthopedic Surgery

## 2020-07-01 ENCOUNTER — Other Ambulatory Visit: Payer: Self-pay

## 2020-07-01 ENCOUNTER — Ambulatory Visit (INDEPENDENT_AMBULATORY_CARE_PROVIDER_SITE_OTHER): Payer: Self-pay | Admitting: Orthopedic Surgery

## 2020-07-01 DIAGNOSIS — Z9889 Other specified postprocedural states: Secondary | ICD-10-CM

## 2020-07-01 DIAGNOSIS — L03031 Cellulitis of right toe: Secondary | ICD-10-CM

## 2020-07-01 NOTE — Patient Instructions (Signed)
Soak 20 minutes to 30 minutes warm water Epson salts twice a day  Continue Levaquin  Follow-up in 2 weeks

## 2020-07-01 NOTE — Progress Notes (Signed)
Chief Complaint  Patient presents with  . Foot Pain    R/ here to have it checked. No pain except where the stitches are.    Date of surgery June 16, 2020  Patient is on Levaquin which is culture specific for Pseudomonas  Wound generally looks good has some mild drainage at the proximal suture line swelling is gone down  Recommend continue Levaquin  Start soaking twice a day 20 minutes with warm water Epson salt   Encounter Diagnoses  Name Primary?  . S/P foot surgery, right I and D 06/16/20  Yes  . Cellulitis of fifth toe of right foot

## 2020-07-15 ENCOUNTER — Encounter: Payer: Self-pay | Admitting: Orthopedic Surgery

## 2020-07-15 ENCOUNTER — Other Ambulatory Visit: Payer: Self-pay

## 2020-07-15 ENCOUNTER — Ambulatory Visit (INDEPENDENT_AMBULATORY_CARE_PROVIDER_SITE_OTHER): Payer: Self-pay | Admitting: Orthopedic Surgery

## 2020-07-15 VITALS — Ht 72.0 in | Wt 223.0 lb

## 2020-07-15 DIAGNOSIS — Z9889 Other specified postprocedural states: Secondary | ICD-10-CM

## 2020-07-15 DIAGNOSIS — M00871 Arthritis due to other bacteria, right ankle and foot: Secondary | ICD-10-CM

## 2020-07-15 NOTE — Progress Notes (Signed)
Chief Complaint  Patient presents with  . Routine Post Op    Rt foot DOS 06/16/20    29 days post op   Right foot infection  Everything looks good   We tok out 2 sutures  He can stop antibiotics   Encounter Diagnoses  Name Primary?  . S/P foot surgery, right I and D 06/16/20  Yes  . 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

## 2020-07-28 ENCOUNTER — Other Ambulatory Visit: Payer: Self-pay | Admitting: Cardiology

## 2020-07-29 ENCOUNTER — Other Ambulatory Visit: Payer: Self-pay | Admitting: Cardiology

## 2020-07-31 ENCOUNTER — Other Ambulatory Visit: Payer: Self-pay | Admitting: Cardiology

## 2020-11-01 ENCOUNTER — Other Ambulatory Visit: Payer: Self-pay | Admitting: Physician Assistant

## 2020-11-04 ENCOUNTER — Other Ambulatory Visit: Payer: Self-pay | Admitting: Physician Assistant

## 2020-12-08 ENCOUNTER — Other Ambulatory Visit: Payer: Self-pay | Admitting: Cardiology

## 2021-02-10 ENCOUNTER — Other Ambulatory Visit: Payer: Self-pay | Admitting: Cardiology

## 2021-07-15 ENCOUNTER — Telehealth: Payer: Self-pay | Admitting: Cardiology

## 2021-07-15 NOTE — Telephone Encounter (Signed)
°*  STAT* If patient is at the pharmacy, call can be transferred to refill team.   1. Which medications need to be refilled? (please list name of each medication and dose if known)  atorvastatin (LIPITOR) 20 MG tablet clopidogrel (PLAVIX) 75 MG tablet lisinopril (ZESTRIL) 40 MG tablet metoprolol succinate (TOPROL-XL) 25 MG 24 hr tablet  2. Which pharmacy/location (including street and city if local pharmacy) is medication to be sent to? [facility] [address]  3. Do they need a 30 day or 90 day supply? 90 day  Patient states he is completely out of the medication

## 2021-07-15 NOTE — Telephone Encounter (Signed)
Need to offer sooner appointment when call back

## 2021-07-16 MED ORDER — LISINOPRIL 40 MG PO TABS: 40.0000 mg | ORAL_TABLET | Freq: Every day | ORAL | 0 refills | Status: DC

## 2021-07-16 MED ORDER — ATORVASTATIN CALCIUM 20 MG PO TABS: 20.0000 mg | ORAL_TABLET | Freq: Every day | ORAL | 0 refills | Status: DC

## 2021-07-16 MED ORDER — METOPROLOL SUCCINATE ER 25 MG PO TB24: 25.0000 mg | ORAL_TABLET | Freq: Every day | ORAL | 0 refills | Status: DC

## 2021-07-16 MED ORDER — CLOPIDOGREL BISULFATE 75 MG PO TABS: 75.0000 mg | ORAL_TABLET | Freq: Every day | ORAL | 0 refills | Status: DC

## 2021-07-16 NOTE — Telephone Encounter (Signed)
Spoke with patient - medications sent to pharm now.  Appointment scheduled for May 2023 with Dr. Diona Browner in [place] office.

## 2021-08-31 ENCOUNTER — Ambulatory Visit
Admission: EM | Admit: 2021-08-31 | Discharge: 2021-08-31 | Disposition: A | Discharge status: Home / Self Care | Payer: Self-pay | Attending: Urgent Care | Admitting: Urgent Care

## 2021-08-31 ENCOUNTER — Other Ambulatory Visit: Payer: Self-pay

## 2021-08-31 DIAGNOSIS — H66012 Acute suppurative otitis media with spontaneous rupture of ear drum, left ear: Secondary | ICD-10-CM

## 2021-08-31 MED ORDER — AMOXICILLIN-POT CLAVULANATE 875-125 MG PO TABS: 1.0000 | ORAL_TABLET | Freq: Two times a day (BID) | ORAL | 0 refills | Status: DC

## 2021-08-31 NOTE — ED Triage Notes (Signed)
Pt reports, left ear pain with bloody drainage x 1 day. ?

## 2021-08-31 NOTE — ED Provider Notes (Signed)
?[place] ? ? ?MRN: [id number] DOB: 10-Jun-1965 ? ?Subjective:  ? ?Justin Villa is a 56 y.o. male presenting for 1 to 2-day history of acute onset persistent left ear pain with bloody drainage.  Denies history of ear infections. ? ?No current facility-administered medications for this encounter. ? ?Current Outpatient Medications:  ?  aspirin EC 81 MG EC tablet, Take 1 tablet (81 mg total) by mouth daily., Disp: , Rfl:  ?  atorvastatin (LIPITOR) 20 MG tablet, Take 1 tablet (20 mg total) by mouth daily., Disp: 90 tablet, Rfl: 0 ?  clopidogrel (PLAVIX) 75 MG tablet, Take 1 tablet (75 mg total) by mouth daily., Disp: 90 tablet, Rfl: 0 ?  ibuprofen (ADVIL) 800 MG tablet, Take 1 tablet (800 mg total) by mouth every 8 (eight) hours as needed., Disp: 90 tablet, Rfl: 1 ?  lisinopril (ZESTRIL) 40 MG tablet, Take 1 tablet (40 mg total) by mouth daily., Disp: 90 tablet, Rfl: 0 ?  metoprolol succinate (TOPROL-XL) 25 MG 24 hr tablet, Take 1 tablet (25 mg total) by mouth daily., Disp: 90 tablet, Rfl: 0  ? ?No Known Allergies ? ?Past Medical History:  ?Diagnosis Date  ? CAD (coronary artery disease) 11/27/2015  ? a. S/p NSTEMI 6/17 >> s/p CABG // b. LHC 6/17: oLAD 50, mLAD 100, D1 70, D2 95, oLCx 95, mLCx 65, OM1 85, OM2 90, mRCA 85, dRCA 75, RPDA 40/50, RPLB2 50   ? Carotid stenosis   ? a. Carotid US 6/17: bilat ICA 1-39%  ? Essential hypertension   ? History of non-ST elevation myocardial infarction (NSTEMI) 11/2015  ? Hyperlipidemia   ?  ? ?Past Surgical History:  ?Procedure Laterality Date  ? BACK SURGERY    ? CARDIAC CATHETERIZATION N/A 11/26/2015  ? Procedure: Left Heart Cath and Coronary Angiography;  Surgeon: Belva Crome, MD;  Location: [facility];  Service: Cardiovascular;  Laterality: N/A;  ? CORONARY ARTERY BYPASS GRAFT N/A 11/27/2015  ? Procedure: CORONARY ARTERY BYPASS GRAFTING (CABG) x 5 (LIMA to LAD, SVG to DIAGONAL, SVG SEQUENTIALLY to OM1 and OM2, SVG to PDA) with EVH from right leg  using greater saphenous vein and mammary.;  Surgeon: Grace Isaac, MD;  Location: [facility];  Service: Open Heart Surgery;  Laterality: N/A;  ? INCISION AND DRAINAGE ABSCESS Right 06/16/2020  ? Procedure: INCISION AND DRAINAGE ABSCESS RIGHT PINKY TOE (5th toe);  Surgeon: Carole Civil, MD;  Location: [facility];  Service: Orthopedics;  Laterality: Right;  ? INTRAOPERATIVE TRANSESOPHAGEAL ECHOCARDIOGRAM N/A 11/27/2015  ? Procedure: INTRAOPERATIVE TRANSESOPHAGEAL ECHOCARDIOGRAM;  Surgeon: Grace Isaac, MD;  Location: [facility];  Service: Open Heart Surgery;  Laterality: N/A;  ? KNEE SURGERY    ? ? ?Family History  ?Problem Relation Age of Onset  ? Heart disease Father   ? ? ?Social History  ? ?Tobacco Use  ? Smoking status: Never  ? Smokeless tobacco: Never  ?Vaping Use  ? Vaping Use: Never used  ?Substance Use Topics  ? Alcohol use: Yes  ?  Alcohol/week: 2.0 standard drinks  ?  Types: 2 Shots of liquor per week  ?  Comment: everyother day  ? Drug use: No  ? ? ?ROS ? ? ?Objective:  ? ?Vitals: ?BP (!) 152/90 (BP Location: Right Arm)   Pulse (!) 59   Temp 98.1 ?F (36.7 ?C) (Oral)   Resp 18   SpO2 98%  ? ?Physical Exam ?Constitutional:   ?   General: He is not  in acute distress. ?   Appearance: Normal appearance. He is well-developed and normal weight. He is not ill-appearing, toxic-appearing or diaphoretic.  ?HENT:  ?   Head: Normocephalic and atraumatic.  ?   Right Ear: Tympanic membrane, ear canal and external ear normal. There is no impacted cerumen. Tympanic membrane is not perforated, erythematous or bulging.  ?   Left Ear: Ear canal and external ear normal. Drainage and tenderness present. There is no impacted cerumen. Tympanic membrane is perforated, erythematous and bulging.  ?   Nose: Nose normal.  ?   Mouth/Throat:  ?   Pharynx: Oropharynx is clear.  ?Eyes:  ?   General: No scleral icterus.    ?   Right eye: No discharge.     ?   Left eye: No discharge.  ?   Extraocular Movements: Extraocular movements  intact.  ?Cardiovascular:  ?   Rate and Rhythm: Normal rate.  ?Pulmonary:  ?   Effort: Pulmonary effort is normal.  ?Musculoskeletal:  ?   Cervical back: Normal range of motion.  ?Neurological:  ?   Mental Status: He is alert and oriented to person, place, and time.  ?Psychiatric:     ?   Mood and Affect: Mood normal.     ?   Behavior: Behavior normal.     ?   Thought Content: Thought content normal.     ?   Judgment: Judgment normal.  ? ? ?Assessment and Plan :  ? ?PDMP not reviewed this encounter. ? ?1. Non-recurrent acute suppurative otitis media of left ear with spontaneous rupture of tympanic membrane   ? ?Start Augmentin to treat his severe otitis media with spontaneous rupture of the tympanic membrane.  Emphasized need for follow-up with [facility] ear nose throat.  Counseled patient on potential for adverse effects with medications prescribed/recommended today, ER and return-to-clinic precautions discussed, patient verbalized understanding. ? ?  ?Jaynee Eagles, PA-C ?08/31/21 1110 ? ?

## 2021-09-08 ENCOUNTER — Other Ambulatory Visit: Payer: Self-pay | Admitting: Cardiology

## 2021-10-05 NOTE — Progress Notes (Signed)
? ? ?Cardiology Office Note ? ?Date: 10/06/2021  ? ?ID: Justin Villa, DOB [birth year], MRN [id number] ? ?PCP:  Patient, No Pcp Per (Inactive)  ?Cardiologist:  Nona Dell, MD ?Electrophysiologist:  None  ? ?Chief Complaint  ?Patient presents with  ? Cardiac follow-up  ? ? ?History of Present Illness: ?Justin Villa is a 56 y.o. male last seen in December 2021.  He presents for a follow-up visit.  He does not report any angina symptoms, but has had somewhat more shortness of breath with activity, noticed this helping a friend carry a couch up some stairs recently.  He still exercising at the gym at least every other day. ? ?I reviewed his medications.  ECG today shows sinus rhythm with leftward axis. ? ?We discussed getting follow-up lab work.  His last ischemic evaluation was in 2021 with low risk Myoview as noted below. ? ?Past Medical History:  ?Diagnosis Date  ? CAD (coronary artery disease) 11/27/2015  ? a. S/p NSTEMI 6/17 >> s/p CABG // b. LHC 6/17: oLAD 50, mLAD 100, D1 70, D2 95, oLCx 95, mLCx 65, OM1 85, OM2 90, mRCA 85, dRCA 75, RPDA 40/50, RPLB2 50   ? Carotid stenosis   ? a. Carotid US 6/17: bilat ICA 1-39%  ? Essential hypertension   ? History of non-ST elevation myocardial infarction (NSTEMI) 11/2015  ? Hyperlipidemia   ? ? ?Past Surgical History:  ?Procedure Laterality Date  ? BACK SURGERY    ? CARDIAC CATHETERIZATION N/A 11/26/2015  ? Procedure: Left Heart Cath and Coronary Angiography;  Surgeon: Lyn Records, MD;  Location: [facility];  Service: Cardiovascular;  Laterality: N/A;  ? CORONARY ARTERY BYPASS GRAFT N/A 11/27/2015  ? Procedure: CORONARY ARTERY BYPASS GRAFTING (CABG) x 5 (LIMA to LAD, SVG to DIAGONAL, SVG SEQUENTIALLY to OM1 and OM2, SVG to PDA) with EVH from right leg using greater saphenous vein and mammary.;  Surgeon: Delight Ovens, MD;  Location: [facility];  Service: Open Heart Surgery;  Laterality: N/A;  ? INCISION AND DRAINAGE ABSCESS Right 06/16/2020  ? Procedure: INCISION  AND DRAINAGE ABSCESS RIGHT PINKY TOE (5th toe);  Surgeon: Vickki Hearing, MD;  [facility];  Service: Orthopedics;  Laterality: Right;  ? INTRAOPERATIVE TRANSESOPHAGEAL ECHOCARDIOGRAM N/A 11/27/2015  ? Procedure: INTRAOPERATIVE TRANSESOPHAGEAL ECHOCARDIOGRAM;  Surgeon: Delight Ovens, MD;  Location[facility];  Service: Open Heart Surgery;  Laterality: N/A;  ? KNEE SURGERY    ? ? ?Current Outpatient Medications  ?Medication Sig Dispense Refill  ? aspirin EC 81 MG EC tablet Take 1 tablet (81 mg total) by mouth daily.    ? atorvastatin (LIPITOR) 20 MG tablet Take 1 tablet (20 mg total) by mouth daily. 90 tablet 0  ? clopidogrel (PLAVIX) 75 MG tablet TAKE 1 TABLET(75 MG) BY MOUTH DAILY 90 tablet 0  ? ibuprofen (ADVIL) 800 MG tablet Take 1 tablet (800 mg total) by mouth every 8 (eight) hours as needed. 90 tablet 1  ? lisinopril (ZESTRIL) 40 MG tablet TAKE 1 TABLET(40 MG) BY MOUTH DAILY 90 tablet 0  ? metoprolol succinate (TOPROL-XL) 25 MG 24 hr tablet TAKE 1 TABLET(25 MG) BY MOUTH DAILY 90 tablet 0  ? nitroGLYCERIN (NITROSTAT) 0.4 MG SL tablet Place 1 tablet (0.4 mg total) under the tongue every 5 (five) minutes x 3 doses as needed for chest pain (if no relief after 2nd dose, proceed to ED or call 911). 25 tablet 3  ? ?No current facility-administered medications for  this visit.  ? ?Allergies:  Patient has no known allergies.  ? ?ROS: No palpitations or syncope. ? ?Physical Exam: ?VS:  BP 134/82   Pulse 67   Ht 6' (1.829 m)   Wt 222 lb 9.6 oz (101 kg)   SpO2 96%   BMI 30.19 kg/m? , BMI Body mass index is 30.19 kg/m?. ? ?Wt Readings from Last 3 Encounters:  ?10/06/21 222 lb 9.6 oz (101 kg)  ?07/15/20 223 lb (101.2 kg)  ?06/15/20 220 lb (99.8 kg)  ?  ?General: Patient appears comfortable at rest. ?HEENT: Conjunctiva and lids normal. ?Neck: Supple, no elevated JVP or carotid bruits, no thyromegaly. ?Lungs: Clear to auscultation, nonlabored breathing at rest. ?Cardiac: Regular rate and rhythm, no S3 or  significant systolic murmur, no pericardial rub. ?Extremities: No pitting edema. ? ?ECG:  An ECG dated 10/21/2019 was personally reviewed today and demonstrated:  Sinus rhythm with leftward axis. ? ?Recent Labwork: ?   ?Component Value Date/Time  ? CHOL 150 10/22/2019 0915  ? TRIG 159 (H) 10/22/2019 0915  ? HDL 28 (L) 10/22/2019 0915  ? CHOLHDL 5.4 10/22/2019 0915  ? VLDL 32 10/22/2019 0915  ? LDLCALC 90 10/22/2019 0915  ? ? ?Other Studies Reviewed Today: ? ?Exercise Myoview 11/25/2019: ?Blood pressure demonstrated a hypertensive response to exercise. ?There was no ST segment deviation noted during stress. ?The study is normal. There are no perfusion defect consistent with prior infarct or current ischemia. ?This is a low risk study. ?The left ventricular ejection fraction is normal (55-65%). ?[facility] treadmill score of 9.5 supports low risk for major cardiac events. ? ?Assessment and Plan: ? ?1.  Multivessel CAD status post CABG in 2017.  Myoview in 2021 was low risk.  He does not describe angina, but more noticeable dyspnea on exertion as discussed above.  ECG shows no significant changes.  Plan to continue aspirin, Plavix, lisinopril, Toprol-XL, and Lipitor.  Prescription provided for as needed nitroglycerin.  We will bring him back within the next 6 months, sooner if symptoms progress.  May need further ischemic work-up. ? ?2.  Mixed hyperlipidemia, check FLP and LFTs on Lipitor. ? ?3.  Essential hypertension, systolic is in the 130s today.  He is on Toprol-XL and lisinopril.  Check BMET. ? ?Medication Adjustments/Labs and Tests Ordered: ?Current medicines are reviewed at length with the patient today.  Concerns regarding medicines are outlined above.  ? ?Tests Ordered: ?Orders Placed This Encounter  ?Procedures  ? Comprehensive metabolic panel  ? CBC  ? Lipid panel  ? EKG 12-Lead  ? ? ?Medication Changes: ?Meds ordered this encounter  ?Medications  ? nitroGLYCERIN (NITROSTAT) 0.4 MG SL tablet  ?  Sig: Place 1 tablet  (0.4 mg total) under the tongue every 5 (five) minutes x 3 doses as needed for chest pain (if no relief after 2nd dose, proceed to ED or call 911).  ?  Dispense:  25 tablet  ?  Refill:  3  ? ? ?Disposition:  Follow up  6 months. ? ?Signed, ?Jonelle Sidle, MD, [facility] ?10/06/2021 9:11 AM    ?[facility] at [facility] ?[address] [place], [place], [place] [address] ?Phone: [phone]; Fax: [phone]  ?

## 2021-10-06 ENCOUNTER — Encounter: Payer: Self-pay | Admitting: Cardiology

## 2021-10-06 ENCOUNTER — Ambulatory Visit (INDEPENDENT_AMBULATORY_CARE_PROVIDER_SITE_OTHER): Payer: Self-pay | Admitting: Cardiology

## 2021-10-06 VITALS — BP 134/82 | HR 67 | Ht 72.0 in | Wt 222.6 lb

## 2021-10-06 DIAGNOSIS — I10 Essential (primary) hypertension: Secondary | ICD-10-CM

## 2021-10-06 DIAGNOSIS — E782 Mixed hyperlipidemia: Secondary | ICD-10-CM

## 2021-10-06 DIAGNOSIS — I25119 Atherosclerotic heart disease of native coronary artery with unspecified angina pectoris: Secondary | ICD-10-CM

## 2021-10-06 DIAGNOSIS — Z79899 Other long term (current) drug therapy: Secondary | ICD-10-CM

## 2021-10-06 MED ORDER — NITROGLYCERIN 0.4 MG SL SUBL: 0.4000 mg | SUBLINGUAL_TABLET | SUBLINGUAL | 3 refills | Status: DC | PRN

## 2021-10-06 NOTE — Patient Instructions (Signed)
Medication Instructions:  ?Your physician recommends that you continue on your current medications as directed. Please refer to the Current Medication list given to you today. ? ?Labwork: ?Your physician recommends that you return for a FASTING lipid, CMET & CBC. Please do not eat or drink for at least 8 hours when you have this done. You may take your medications that morning with a sip of water. ?[employer] off [address] or [address] Linna Hoff ? ?Testing/Procedures: ?none ? ?Follow-Up: ?Your physician recommends that you schedule a follow-up appointment in: 6 months ? ?Any Other Special Instructions Will Be Listed Below (If Applicable). ? ?If you need a refill on your cardiac medications before your next appointment, please call your pharmacy. ?

## 2021-10-17 ENCOUNTER — Other Ambulatory Visit: Payer: Self-pay | Admitting: Cardiology

## 2021-12-28 ENCOUNTER — Ambulatory Visit: Payer: Self-pay | Admitting: Cardiology

## 2022-03-31 ENCOUNTER — Telehealth: Payer: Self-pay | Admitting: *Deleted

## 2022-03-31 ENCOUNTER — Ambulatory Visit
Admission: EM | Admit: 2022-03-31 | Discharge: 2022-03-31 | Disposition: A | Discharge status: Home / Self Care | Payer: Self-pay | Attending: Nurse Practitioner | Admitting: Nurse Practitioner

## 2022-03-31 DIAGNOSIS — K5900 Constipation, unspecified: Secondary | ICD-10-CM

## 2022-03-31 DIAGNOSIS — R1030 Lower abdominal pain, unspecified: Secondary | ICD-10-CM

## 2022-03-31 LAB — POCT URINALYSIS DIP (MANUAL ENTRY)
Bilirubin, UA: NEGATIVE
Blood, UA: NEGATIVE
Glucose, UA: 100 mg/dL — AB
Ketones, POC UA: NEGATIVE mg/dL
Nitrite, UA: NEGATIVE
Protein Ur, POC: NEGATIVE mg/dL
Spec Grav, UA: 1.015 (ref 1.010–1.025)
Urobilinogen, UA: 0.2 E.U./dL
pH, UA: 6 (ref 5.0–8.0)

## 2022-03-31 MED ORDER — ALUM & MAG HYDROXIDE-SIMETH 200-200-20 MG/5ML PO SUSP
30.0000 mL | Freq: Once | ORAL | Status: AC
  Administered 2022-03-31: 30 mL via ORAL

## 2022-03-31 MED ORDER — POLYETHYLENE GLYCOL 3350 17 G PO PACK: 17.0000 g | PACK | Freq: Two times a day (BID) | ORAL | 0 refills | Status: DC

## 2022-03-31 NOTE — Telephone Encounter (Signed)
Pt called in asking for meds for indigestion. We gave him maalox in office and he was given a rx for miralax. Spoke with pt and advised he can buy maalox OTC. He verbalized understanding

## 2022-03-31 NOTE — ED Triage Notes (Signed)
Pt reports pain in low abdomen for about 2 months now. Pt took two tylenol yesterday and it relieve a little pressure and pain.

## 2022-03-31 NOTE — ED Provider Notes (Signed)
[facility]    CSN: [id number] Arrival date & time: 03/31/22  1007      History   Chief Complaint No chief complaint on file.   HPI Justin Villa is a 56 y.o. male.   Patient presents with abdominal pain in his lower abdomen that has been ongoing for the past 2 to 3 months.  Reports usually, the pain lasts for 2 to 3 days, then fully gets better.  Reports the pain has been ongoing now for the past 4 to 5 days.  Reports the pain onset is sudden, severity ranges from moderate to severe.  The pain is sharp and the pain lasts a few minutes.  Pain does not radiate to other parts of his belly or to his back.  He denies fever, nausea/vomiting, unexplained weight loss, new rash, hematuria, dysuria, frequent NSAID use.  He does take a baby aspirin daily for his heart.  He endorses decreased appetite when he has abdominal pain, constipation, blood on the toilet paper when he wipes, diarrhea at times without blood, and increased urinary frequency.  He took 2 Tylenol yesterday which helped with the pain a little bit.  Reports certain movements makes the pain worse.  Patient denies any history of abdominal pain or abdominal issues.  Denies abdominal surgeries.  Denies family history of abdominal diagnoses.  Does not have a primary care provider, has never had a colonoscopy.    Past Medical History:  Diagnosis Date   CAD (coronary artery disease) 11/27/2015   a. S/p NSTEMI 6/17 >> s/p CABG // b. LHC 6/17: oLAD 50, mLAD 100, D1 70, D2 95, oLCx 95, mLCx 65, OM1 85, OM2 90, mRCA 85, dRCA 75, RPDA 40/50, RPLB2 50    Carotid stenosis    a. Carotid US 6/17: bilat ICA 1-39%   Essential hypertension    History of non-ST elevation myocardial infarction (NSTEMI) 11/2015   Hyperlipidemia     Patient Active Problem List   Diagnosis Date Noted   S/P foot surgery, right I and D 06/16/20  06/18/2020   Septic arthritis of right foot (HCC)    Pain in right foot 06/15/2020   HTN (hypertension)  12/10/2015   HLD (hyperlipidemia) 12/10/2015   Carotid stenosis    CAD (coronary artery disease) 11/27/2015   NSTEMI (non-ST elevated myocardial infarction) ([place]) 11/26/2015   ARTHRITIS, WRIST 07/19/2010    Past Surgical History:  Procedure Laterality Date   BACK SURGERY     CARDIAC CATHETERIZATION N/A 11/26/2015   Procedure: Left Heart Cath and Coronary Angiography;  Surgeon: Belva Crome, MD;  Location: [place] [facility];  Service: Cardiovascular;  Laterality: N/A;   CORONARY ARTERY BYPASS GRAFT N/A 11/27/2015   Procedure: CORONARY ARTERY BYPASS GRAFTING (CABG) x 5 (LIMA to LAD, SVG to DIAGONAL, SVG SEQUENTIALLY to OM1 and OM2, SVG to PDA) with EVH from right leg using greater saphenous vein and mammary.;  Surgeon: Grace Isaac, MD;  Location: [place];  Service: Open Heart Surgery;  Laterality: N/A;   INCISION AND DRAINAGE ABSCESS Right 06/16/2020   Procedure: INCISION AND DRAINAGE ABSCESS RIGHT PINKY TOE (5th toe);  Surgeon: Carole Civil, MD;  Location: [facility];  Service: Orthopedics;  Laterality: Right;   INTRAOPERATIVE TRANSESOPHAGEAL ECHOCARDIOGRAM N/A 11/27/2015   Procedure: INTRAOPERATIVE TRANSESOPHAGEAL ECHOCARDIOGRAM;  Surgeon: Grace Isaac, MD;  Location: [facility];  Service: Open Heart Surgery;  Laterality: N/A;   KNEE SURGERY         Home Medications  Prior to Admission medications   Medication Sig Start Date End Date Taking? Authorizing Provider  polyethylene glycol (MIRALAX) 17 g packet Take 17 g by mouth 2 (two) times daily. 03/31/22  Yes Eulogio Bear, NP  aspirin EC 81 MG EC tablet Take 1 tablet (81 mg total) by mouth daily. 12/01/15   Lars Pinks M, PA-C  atorvastatin (LIPITOR) 20 MG tablet TAKE ONE TABLET BY MOUTH DAILY 10/18/21   Satira Sark, MD  clopidogrel (PLAVIX) 75 MG tablet TAKE ONE TABLET BY MOUTH DAILY 10/18/21   Satira Sark, MD  ibuprofen (ADVIL) 800 MG tablet Take 1 tablet (800 mg total) by mouth every 8 (eight) hours  as needed. 06/16/20   Carole Civil, MD  lisinopril (ZESTRIL) 40 MG tablet TAKE ONE TABLET BY MOUTH DAILY 10/18/21   Satira Sark, MD  metoprolol succinate (TOPROL-XL) 25 MG 24 hr tablet TAKE ONE TABLET BY MOUTH DAILY 10/18/21   Satira Sark, MD  nitroGLYCERIN (NITROSTAT) 0.4 MG SL tablet Place 1 tablet (0.4 mg total) under the tongue every 5 (five) minutes x 3 doses as needed for chest pain (if no relief after 2nd dose, proceed to ED or call 911). 10/06/21   Satira Sark, MD    Family History Family History  Problem Relation Age of Onset   Heart disease Father     Social History Social History   Tobacco Use   Smoking status: Never   Smokeless tobacco: Never  Vaping Use   Vaping Use: Never used  Substance Use Topics   Alcohol use: Yes    Alcohol/week: 2.0 standard drinks of alcohol    Types: 2 Shots of liquor per week    Comment: everyother day   Drug use: No     Allergies   Patient has no known allergies.   Review of Systems Review of Systems Per HPI  Physical Exam Triage Vital Signs ED Triage Vitals  Enc Vitals Group     BP 03/31/22 1056 120/83     Pulse Rate 03/31/22 1056 81     Resp 03/31/22 1056 18     Temp 03/31/22 1056 98.9 F (37.2 C)     Temp Source 03/31/22 1056 Oral     SpO2 03/31/22 1056 96 %     Weight --      Height --      Head Circumference --      Peak Flow --      Pain Score 03/31/22 1101 8     Pain Loc --      Pain Edu? --      Excl. in [place]? --    No data found.  Updated Vital Signs BP 120/83   Pulse 81   Temp 98.9 F (37.2 C) (Oral)   Resp 18   SpO2 96%   Visual Acuity Right Eye Distance:   Left Eye Distance:   Bilateral Distance:    Right Eye Near:   Left Eye Near:    Bilateral Near:     Physical Exam Vitals and nursing note reviewed.  Constitutional:      General: He is not in acute distress.    Appearance: Normal appearance. He is not toxic-appearing.  HENT:     Head: Normocephalic and  atraumatic.     Mouth/Throat:     Mouth: Mucous membranes are moist.     Pharynx: Oropharynx is clear. No posterior oropharyngeal erythema.  Cardiovascular:     Rate and Rhythm:  Normal rate and regular rhythm.  Pulmonary:     Effort: Pulmonary effort is normal. No respiratory distress.     Breath sounds: Normal breath sounds. No wheezing, rhonchi or rales.  Abdominal:     General: Abdomen is flat. Bowel sounds are normal. There is no distension.     Palpations: Abdomen is soft.     Tenderness: There is generalized abdominal tenderness. There is no right CVA tenderness, left CVA tenderness, guarding or rebound.  Musculoskeletal:     Cervical back: Normal range of motion.  Skin:    General: Skin is warm and dry.     Capillary Refill: Capillary refill takes less than 2 seconds.     Coloration: Skin is not jaundiced or pale.     Findings: No erythema.  Neurological:     Mental Status: He is alert.     Motor: No weakness.     Gait: Gait normal.  Psychiatric:        Behavior: Behavior is cooperative.      UC Treatments / Results  Labs (all labs ordered are listed, but only abnormal results are displayed) Labs Reviewed  POCT URINALYSIS DIP (MANUAL ENTRY) - Abnormal; Notable for the following components:      Result Value   Glucose, UA =100 (*)    Leukocytes, UA Trace (*)    All other components within normal limits    EKG   Radiology No results found.  Procedures Procedures (including critical care time)  Medications Ordered in UC Medications  alum & mag hydroxide-simeth (MAALOX/MYLANTA) 200-200-20 MG/5ML suspension 30 mL (30 mLs Oral Given 03/31/22 1149)    Initial Impression / Assessment and Plan / UC Course  I have reviewed the triage vital signs and the nursing notes.  Pertinent labs & imaging results that were available during my care of the patient were reviewed by me and considered in my medical decision making (see chart for details).   Patient is  well-appearing, normotensive, afebrile, not tachycardic, not tachypneic, oxygenating well on room air.    Constipation, unspecified constipation type Lower abdominal pain GI cocktail given today in urgent care for indigestion; suspect constipation UA today negative for nitrites, blood Treat with MiraLAX twice daily until lungs also to bowel movement, then decrease to once daily Can take a one-time dose of Dulcolax suppository for constipation Encourage increasing water intake, fiber intake in diet Recommended following up with primary care provider for colon cancer screening as indicated and other blood work as indicated ER and return precautions discussed  The patient was given the opportunity to ask questions.  All questions answered to their satisfaction.  The patient is in agreement to this plan.    Final Clinical Impressions(s) / UC Diagnoses   Final diagnoses:  Lower abdominal pain  Constipation, unspecified constipation type     Discharge Instructions      UA today does not show any blood which is a good thing.  I think the pain is most likely coming from constipation.  Please start taking Miralax 17g twice daily and I would recommend taking a Doculax suppository today. Try to increase fiber in your diet.    Follow up with a PCP if your symptoms do not get better -we have initiated primary care assistance today.  Also follow-up with a primary care provider to discuss preventative care such as colon cancer screening, blood work, etc.  If you develop severe abdominal pain that does not improve with these measures, or your unable  to keep food or fluids down, please go to emergency room.     ED Prescriptions     Medication Sig Dispense Auth. Provider   polyethylene glycol (MIRALAX) 17 g packet Take 17 g by mouth 2 (two) times daily. 14 each Eulogio Bear, NP      PDMP not reviewed this encounter.   Eulogio Bear, NP 03/31/22 [phone]

## 2022-03-31 NOTE — Discharge Instructions (Addendum)
UA today does not show any blood which is a good thing.  I think the pain is most likely coming from constipation.  Please start taking Miralax 17g twice daily and I would recommend taking a Doculax suppository today. Try to increase fiber in your diet.    Follow up with a PCP if your symptoms do not get better -we have initiated primary care assistance today.  Also follow-up with a primary care provider to discuss preventative care such as colon cancer screening, blood work, etc.  If you develop severe abdominal pain that does not improve with these measures, or your unable to keep food or fluids down, please go to emergency room.

## 2022-04-15 ENCOUNTER — Ambulatory Visit: Payer: Self-pay | Admitting: Cardiology

## 2022-05-24 ENCOUNTER — Ambulatory Visit: Payer: 59 | Attending: Cardiology | Admitting: Cardiology

## 2022-05-24 ENCOUNTER — Encounter: Payer: Self-pay | Admitting: Cardiology

## 2022-05-24 VITALS — BP 124/74 | HR 65 | Ht 72.0 in | Wt 227.0 lb

## 2022-05-24 DIAGNOSIS — I25119 Atherosclerotic heart disease of native coronary artery with unspecified angina pectoris: Secondary | ICD-10-CM

## 2022-05-24 DIAGNOSIS — R1084 Generalized abdominal pain: Secondary | ICD-10-CM | POA: Diagnosis not present

## 2022-05-24 DIAGNOSIS — I10 Essential (primary) hypertension: Secondary | ICD-10-CM

## 2022-05-24 DIAGNOSIS — E782 Mixed hyperlipidemia: Secondary | ICD-10-CM

## 2022-05-24 DIAGNOSIS — K30 Functional dyspepsia: Secondary | ICD-10-CM | POA: Diagnosis not present

## 2022-05-24 MED ORDER — PANTOPRAZOLE SODIUM 40 MG PO TBEC: 40.0000 mg | DELAYED_RELEASE_TABLET | Freq: Every day | ORAL | 3 refills | Status: DC

## 2022-05-24 NOTE — Progress Notes (Signed)
Cardiology Office Note  Date: 05/24/2022   ID: Justin Villa, DOB 05/29/1966, MRN [id number]  PCP:  Patient, No Pcp Per  Cardiologist:  Nona Dell, MD Electrophysiologist:  None   Chief Complaint  Patient presents with   Cardiac follow-up    History of Present Illness: Justin Villa is a 56 y.o. male last seen in May.  He is here today with his girlfriend for a follow-up visit.  He does not report any angina or progressive shortness of breath.  Mainly mentions significant reflux/indigestion, essentially on a daily basis.  Also intermittent abdominal pain and interval bout with constipation.  He was seen in urgent care and has taken MiraLAX.  Not on a antireflux medication at this point.  He does not have a PCP.  He does not report any obvious blood in his stools.  We went over his medications today and discussed stopping Plavix at this point.  He is overdue for follow-up FLP and LFTs on Lipitor, this will be arranged as well.  Blood pressure today looks good.  Past Medical History:  Diagnosis Date   CAD (coronary artery disease) 11/27/2015   a. S/p NSTEMI 6/17 >> s/p CABG // b. LHC 6/17: oLAD 50, mLAD 100, D1 70, D2 95, oLCx 95, mLCx 65, OM1 85, OM2 90, mRCA 85, dRCA 75, RPDA 40/50, RPLB2 50    Carotid stenosis    a. Carotid US 6/17: bilat ICA 1-39%   Essential hypertension    History of non-ST elevation myocardial infarction (NSTEMI) 11/2015   Hyperlipidemia     Past Surgical History:  Procedure Laterality Date   BACK SURGERY     CARDIAC CATHETERIZATION N/A 11/26/2015   Procedure: Left Heart Cath and Coronary Angiography;  Surgeon: Lyn Records, MD;  Location: [facility] INVASIVE CV LAB;  Service: Cardiovascular;  Laterality: N/A;   CORONARY ARTERY BYPASS GRAFT N/A 11/27/2015   Procedure: CORONARY ARTERY BYPASS GRAFTING (CABG) x 5 (LIMA to LAD, SVG to DIAGONAL, SVG SEQUENTIALLY to OM1 and OM2, SVG to PDA) with EVH from right leg using greater saphenous vein and mammary.;   Surgeon: Delight Ovens, MD;  Location: [facility] OR;  Service: Open Heart Surgery;  Laterality: N/A;   INCISION AND DRAINAGE ABSCESS Right 06/16/2020   Procedure: INCISION AND DRAINAGE ABSCESS RIGHT PINKY TOE (5th toe);  Surgeon: Vickki Hearing, MD;  Location: [facility] ORS;  Service: Orthopedics;  Laterality: Right;   INTRAOPERATIVE TRANSESOPHAGEAL ECHOCARDIOGRAM N/A 11/27/2015   Procedure: INTRAOPERATIVE TRANSESOPHAGEAL ECHOCARDIOGRAM;  Surgeon: Delight Ovens, MD;  Location: [facility] OR;  Service: Open Heart Surgery;  Laterality: N/A;   KNEE SURGERY      Current Outpatient Medications  Medication Sig Dispense Refill   aspirin EC 81 MG EC tablet Take 1 tablet (81 mg total) by mouth daily.     atorvastatin (LIPITOR) 20 MG tablet TAKE ONE TABLET BY MOUTH DAILY 90 tablet 3   clopidogrel (PLAVIX) 75 MG tablet TAKE ONE TABLET BY MOUTH DAILY 90 tablet 3   lisinopril (ZESTRIL) 40 MG tablet TAKE ONE TABLET BY MOUTH DAILY 90 tablet 3   metoprolol succinate (TOPROL-XL) 25 MG 24 hr tablet TAKE ONE TABLET BY MOUTH DAILY 90 tablet 3   nitroGLYCERIN (NITROSTAT) 0.4 MG SL tablet Place 1 tablet (0.4 mg total) under the tongue every 5 (five) minutes x 3 doses as needed for chest pain (if no relief after 2nd dose, proceed to ED or call 911). 25 tablet 3   pantoprazole (PROTONIX) 40 MG  tablet Take 1 tablet (40 mg total) by mouth daily. 90 tablet 3   polyethylene glycol (MIRALAX) 17 g packet Take 17 g by mouth 2 (two) times daily. 14 each 0   No current facility-administered medications for this visit.   Allergies:  Patient has no known allergies.   ROS: No syncope, no palpitations.  No reported snoring or apnea episodes.  Physical Exam: VS:  BP 124/74   Pulse 65   Ht 6' (1.829 m)   Wt 227 lb (103 kg)   SpO2 97%   BMI 30.79 kg/m , BMI Body mass index is 30.79 kg/m.  Wt Readings from Last 3 Encounters:  05/24/22 227 lb (103 kg)  10/06/21 222 lb 9.6 oz (101 kg)  07/15/20 223 lb (101.2 kg)    General:  Patient appears comfortable at rest. HEENT: Conjunctiva and lids normal. Neck: Supple, no elevated JVP or carotid bruits. Lungs: Clear to auscultation, nonlabored breathing at rest. Cardiac: Regular rate and rhythm, no S3 or significant systolic murmur. Abdomen: Soft, bowel sounds present. Extremities: No pitting edema.  ECG:  An ECG dated 10/06/2021 was personally reviewed today and demonstrated:  Sinus rhythm with leftward axis.  Recent Labwork:    Component Value Date/Time   CHOL 150 10/22/2019 0915   TRIG 159 (H) 10/22/2019 0915   HDL 28 (L) 10/22/2019 0915   CHOLHDL 5.4 10/22/2019 0915   VLDL 32 10/22/2019 0915   LDLCALC 90 10/22/2019 0915    Other Studies Reviewed Today:  Exercise Myoview 11/25/2019: Blood pressure demonstrated a hypertensive response to exercise. There was no ST segment deviation noted during stress. The study is normal. There are no perfusion defect consistent with prior infarct or current ischemia. This is a low risk study. The left ventricular ejection fraction is normal (55-65%). Duke treadmill score of 9.5 supports low risk for major cardiac events.  Assessment and Plan:  1.  Multivessel CAD status post CABG in 2017 with low risk Myoview in 2021.  No definite angina at this time or worsening dyspnea on exertion.  Plan to stop Plavix given worsening reflux symptoms.  Continue aspirin, Lipitor, lisinopril, Toprol-XL, and as needed nitroglycerin.  He is overdue for follow-up FLP and LFTs which will be arranged.  LDL was 90 in 2021.  2.  Progressive reflux/indigestion (daily at this point) with intermittent abdominal pain.  Stopping Plavix as noted above.  Also start Protonix 40 mg daily with referral to GI.  He does not have a PCP at this time.  3.  Essential hypertension, blood pressure is adequately controlled today on current regimen.  No changes were made.  Medication Adjustments/Labs and Tests Ordered: Current medicines are reviewed at length with  the patient today.  Concerns regarding medicines are outlined above.   Tests Ordered: Orders Placed This Encounter  Procedures   Lipid Profile   Hepatic function panel   Ambulatory referral to Gastroenterology    Medication Changes: Meds ordered this encounter  Medications   pantoprazole (PROTONIX) 40 MG tablet    Sig: Take 1 tablet (40 mg total) by mouth daily.    Dispense:  90 tablet    Refill:  3    Disposition:  Follow up  6 months.  Signed, Jonelle Sidle, MD, [facility] 05/24/2022 4:37 PM    [facility] at [facility] [address] [place], [place] [address] Phone: [phone]; Fax: [phone]

## 2022-05-24 NOTE — Patient Instructions (Signed)
Medication Instructions:  STOP Plavix   START Protonix 40 mg daily  Labwork: Fasting Lipids, LFT's  Testing/Procedures: None today  Follow-Up: 6 months  Any Other Special Instructions Will Be Listed Below (If Applicable).   You have been referred to Gastroenterology  (GI). They will call you to schedule an appointment   If you need a refill on your cardiac medications before your next appointment, please call your pharmacy.

## 2022-05-25 ENCOUNTER — Encounter: Payer: Self-pay | Admitting: Gastroenterology

## 2022-06-07 ENCOUNTER — Encounter (HOSPITAL_COMMUNITY): Payer: Self-pay | Admitting: Emergency Medicine

## 2022-06-07 ENCOUNTER — Ambulatory Visit
Admission: EM | Admit: 2022-06-07 | Discharge: 2022-06-07 | Disposition: A | Discharge status: Home / Self Care | Payer: 59 | Attending: Urgent Care | Admitting: Urgent Care

## 2022-06-07 ENCOUNTER — Emergency Department (HOSPITAL_COMMUNITY): Payer: 59

## 2022-06-07 ENCOUNTER — Other Ambulatory Visit: Payer: Self-pay

## 2022-06-07 ENCOUNTER — Emergency Department (HOSPITAL_COMMUNITY)
Admission: EM | Admit: 2022-06-07 | Discharge: 2022-06-07 | Disposition: A | Discharge status: Home / Self Care | Payer: 59 | Attending: Emergency Medicine | Admitting: Emergency Medicine

## 2022-06-07 ENCOUNTER — Encounter: Payer: Self-pay | Admitting: Emergency Medicine

## 2022-06-07 DIAGNOSIS — R109 Unspecified abdominal pain: Secondary | ICD-10-CM | POA: Diagnosis not present

## 2022-06-07 DIAGNOSIS — R1031 Right lower quadrant pain: Secondary | ICD-10-CM

## 2022-06-07 DIAGNOSIS — Z7902 Long term (current) use of antithrombotics/antiplatelets: Secondary | ICD-10-CM | POA: Diagnosis not present

## 2022-06-07 DIAGNOSIS — R100 Acute abdomen: Secondary | ICD-10-CM | POA: Diagnosis not present

## 2022-06-07 DIAGNOSIS — Z79899 Other long term (current) drug therapy: Secondary | ICD-10-CM | POA: Insufficient documentation

## 2022-06-07 DIAGNOSIS — K5792 Diverticulitis of intestine, part unspecified, without perforation or abscess without bleeding: Secondary | ICD-10-CM | POA: Diagnosis not present

## 2022-06-07 DIAGNOSIS — I10 Essential (primary) hypertension: Secondary | ICD-10-CM | POA: Diagnosis not present

## 2022-06-07 DIAGNOSIS — I2510 Atherosclerotic heart disease of native coronary artery without angina pectoris: Secondary | ICD-10-CM | POA: Insufficient documentation

## 2022-06-07 DIAGNOSIS — Z7982 Long term (current) use of aspirin: Secondary | ICD-10-CM | POA: Insufficient documentation

## 2022-06-07 LAB — COMPREHENSIVE METABOLIC PANEL
ALT: 30 U/L (ref 0–44)
AST: 22 U/L (ref 15–41)
Albumin: 4 g/dL (ref 3.5–5.0)
Alkaline Phosphatase: 52 U/L (ref 38–126)
Anion gap: 7 (ref 5–15)
BUN: 20 mg/dL (ref 6–20)
CO2: 24 mmol/L (ref 22–32)
Calcium: 8.8 mg/dL — ABNORMAL LOW (ref 8.9–10.3)
Chloride: 102 mmol/L (ref 98–111)
Creatinine, Ser: 1.35 mg/dL — ABNORMAL HIGH (ref 0.61–1.24)
GFR, Estimated: >60 mL/min (ref >60)
Glucose, Bld: 121 mg/dL — ABNORMAL HIGH (ref 70–99)
Potassium: 3.8 mmol/L (ref 3.5–5.1)
Sodium: 133 mmol/L — ABNORMAL LOW (ref 135–145)
Total Bilirubin: 2 mg/dL — ABNORMAL HIGH (ref 0.3–1.2)
Total Protein: 7 g/dL (ref 6.5–8.1)

## 2022-06-07 LAB — URINALYSIS, ROUTINE W REFLEX MICROSCOPIC
Bilirubin Urine: NEGATIVE
Glucose, UA: NEGATIVE mg/dL
Hgb urine dipstick: NEGATIVE
Ketones, ur: NEGATIVE mg/dL
Leukocytes,Ua: NEGATIVE
Nitrite: NEGATIVE
Protein, ur: 30 mg/dL — AB
Specific Gravity, Urine: 1.027 (ref 1.005–1.030)
pH: 5 (ref 5.0–8.0)

## 2022-06-07 LAB — CBC
HCT: 42.6 % (ref 39.0–52.0)
Hemoglobin: 14.9 g/dL (ref 13.0–17.0)
MCH: 30.6 pg (ref 26.0–34.0)
MCHC: 35 g/dL (ref 30.0–36.0)
MCV: 87.5 fL (ref 80.0–100.0)
Platelets: 227 10*3/uL (ref 150–400)
RBC: 4.87 MIL/uL (ref 4.22–5.81)
RDW: 12.6 % (ref 11.5–15.5)
WBC: 9.1 10*3/uL (ref 4.0–10.5)
nRBC: 0 % (ref 0.0–0.2)

## 2022-06-07 LAB — LIPASE, BLOOD: Lipase: 35 U/L (ref 11–51)

## 2022-06-07 MED ORDER — ACETAMINOPHEN 500 MG PO TABS
1000.0000 mg | ORAL_TABLET | Freq: Once | ORAL | Status: AC
  Administered 2022-06-07: 1000 mg via ORAL

## 2022-06-07 MED ORDER — AMOXICILLIN-POT CLAVULANATE 875-125 MG PO TABS
1.0000 | ORAL_TABLET | Freq: Once | ORAL | Status: AC
  Administered 2022-06-07: 1 via ORAL
  Filled 2022-06-07: qty 1

## 2022-06-07 MED ORDER — ACETAMINOPHEN 325 MG PO TABS: 975.0000 mg | ORAL_TABLET | Freq: Once | ORAL | Status: DC

## 2022-06-07 MED ORDER — IOHEXOL 300 MG/ML  SOLN
100.0000 mL | Freq: Once | INTRAMUSCULAR | Status: AC | PRN
  Administered 2022-06-07: 100 mL via INTRAVENOUS

## 2022-06-07 MED ORDER — ONDANSETRON HCL 4 MG/2ML IJ SOLN
4.0000 mg | Freq: Once | INTRAMUSCULAR | Status: AC
  Administered 2022-06-07: 4 mg via INTRAVENOUS
  Filled 2022-06-07: qty 2

## 2022-06-07 MED ORDER — HYDROCODONE-ACETAMINOPHEN 5-325 MG PO TABS: ORAL_TABLET | ORAL | 0 refills | Status: DC

## 2022-06-07 MED ORDER — HYDROMORPHONE HCL 1 MG/ML IJ SOLN
0.5000 mg | Freq: Once | INTRAMUSCULAR | Status: AC
  Administered 2022-06-07: 0.5 mg via INTRAVENOUS
  Filled 2022-06-07: qty 0.5

## 2022-06-07 MED ORDER — AMOXICILLIN-POT CLAVULANATE 875-125 MG PO TABS: 1.0000 | ORAL_TABLET | Freq: Two times a day (BID) | ORAL | 0 refills | Status: DC

## 2022-06-07 NOTE — ED Notes (Addendum)
Pt reports sharp abdominal pain in lower RQ. Pain originated periumbilical. lower RQ is tender w/o signs of swelling or bruising.

## 2022-06-07 NOTE — ED Provider Notes (Signed)
[facility] EMERGENCY DEPARTMENT Provider Note   CSN: [id number] Arrival date & time: 06/07/22  1021     History {Add pertinent medical, surgical, social history, OB history to HPI:1} Chief Complaint  Patient presents with   Abdominal Pain    Justin Villa is a 57 y.o. male.   Abdominal Pain Associated symptoms: nausea   Associated symptoms: no chest pain, no chills, no constipation, no cough, no dysuria, no fever, no shortness of breath and no vomiting        Justin Villa is a 57 y.o. male with past medical history of hypertension, coronary artery disease with prior NSTEMI in 2017, hyperlipidemia who presents to the Emergency Department complaining of right lower quadrant abdominal pain x 1 day.  Symptoms been associated with nausea.  He was seen at local urgent care earlier today and sent here for further evaluation for possible appendicitis.  He describes a sharp stabbing type pain that originated near his umbilicus and has radiated to the right lower quadrant.  Pain is worsened with movement, there is no alleviating factors.  Pain is not aggravated by food intake.  He notes having some left-sided low back pain that is nonradiating.  This pain began prior to onset of his abdominal pain.  No known injury.  No pain numbness or weakness of his lower extremities.  No history of prior abdominal surgeries.  Denies any dysuria symptoms, pain or swelling of his testicles.    Home Medications Prior to Admission medications   Medication Sig Start Date End Date Taking? Authorizing Provider  aspirin EC 81 MG EC tablet Take 1 tablet (81 mg total) by mouth daily. 12/01/15   Lars Pinks M, PA-C  atorvastatin (LIPITOR) 20 MG tablet TAKE ONE TABLET BY MOUTH DAILY 10/18/21   Satira Sark, MD  clopidogrel (PLAVIX) 75 MG tablet TAKE ONE TABLET BY MOUTH DAILY 10/18/21   Satira Sark, MD  lisinopril (ZESTRIL) 40 MG tablet TAKE ONE TABLET BY MOUTH DAILY 10/18/21   Satira Sark, MD  metoprolol succinate (TOPROL-XL) 25 MG 24 hr tablet TAKE ONE TABLET BY MOUTH DAILY 10/18/21   Satira Sark, MD  nitroGLYCERIN (NITROSTAT) 0.4 MG SL tablet Place 1 tablet (0.4 mg total) under the tongue every 5 (five) minutes x 3 doses as needed for chest pain (if no relief after 2nd dose, proceed to ED or call 911). 10/06/21   Satira Sark, MD  pantoprazole (PROTONIX) 40 MG tablet Take 1 tablet (40 mg total) by mouth daily. 05/24/22   Satira Sark, MD  polyethylene glycol (MIRALAX) 17 g packet Take 17 g by mouth 2 (two) times daily. 03/31/22   Eulogio Bear, NP      Allergies    Patient has no known allergies.    Review of Systems   Review of Systems  Constitutional:  Positive for appetite change. Negative for chills and fever.  Respiratory:  Negative for cough and shortness of breath.   Cardiovascular:  Negative for chest pain.  Gastrointestinal:  Positive for abdominal pain and nausea. Negative for constipation and vomiting.  Genitourinary:  Negative for decreased urine volume, difficulty urinating, dysuria, flank pain, penile swelling, scrotal swelling and testicular pain.  Musculoskeletal:  Positive for back pain (Left-sided back pain). Negative for arthralgias.  Skin:  Negative for rash.  Neurological:  Negative for weakness and numbness.    Physical Exam Updated Vital Signs BP 102/77   Pulse 84   Temp 98.1 F (  36.7 C) (Oral)   Resp 18   Ht 6' (1.829 m)   Wt 99.8 kg   SpO2 98%   BMI 29.84 kg/m  Physical Exam Vitals and nursing note reviewed.  Constitutional:      General: He is not in acute distress.    Appearance: Normal appearance. He is well-developed. He is not ill-appearing or toxic-appearing.  Cardiovascular:     Rate and Rhythm: Normal rate and regular rhythm.     Pulses: Normal pulses.  Pulmonary:     Effort: Pulmonary effort is normal.     Breath sounds: Normal breath sounds.  Chest:     Chest wall: No tenderness.   Abdominal:     Palpations: Abdomen is soft.     Tenderness: There is abdominal tenderness.     Comments: Tender to palpation of right lower quadrant.  Abdomen is soft, no guarding or rebound tenderness.  No CVA tenderness.  Musculoskeletal:        General: Normal range of motion.  Skin:    General: Skin is warm.     Capillary Refill: Capillary refill takes less than 2 seconds.  Neurological:     General: No focal deficit present.     Mental Status: He is alert.     Sensory: No sensory deficit.     Motor: No weakness.     ED Results / Procedures / Treatments   Labs (all labs ordered are listed, but only abnormal results are displayed) Labs Reviewed  COMPREHENSIVE METABOLIC PANEL - Abnormal; Notable for the following components:      Result Value   Sodium 133 (*)    Glucose, Bld 121 (*)    Creatinine, Ser 1.35 (*)    Calcium 8.8 (*)    Total Bilirubin 2.0 (*)    All other components within normal limits  LIPASE, BLOOD  CBC  URINALYSIS, ROUTINE W REFLEX MICROSCOPIC    EKG None  Radiology No results found.  Procedures Procedures  {Document cardiac monitor, telemetry assessment procedure when appropriate:1}  Medications Ordered in ED Medications - No data to display  ED Course/ Medical Decision Making/ A&P                           Medical Decision Making Patient here with 1 day history of right lower quadrant pain.  Began experiencing pain of his left lower back 1 day prior.  No known injury.  Back pain has been nonradiating but associated with movement.  Yesterday, he began having pain at his umbilicus that radiated to the right lower quadrant.  Has been present since onset.  Associated with nausea but no vomiting fever or chills.  He denies any dysuria symptoms, numbness or weakness of his lower extremities.  States his right lower quadrant pain worsens with movement.  On my exam, patient nontoxic-appearing.  He has significant tenderness of the right lower  quadrant.  There is no guarding or CVA tenderness and his abdomen is soft.  Exam concerning for acute appendicitis.  Differential would also include pyelonephritis, UTI, musculoskeletal injury, or other intra-abdominal process.  He will need further evaluation with CT imaging and labs.  Amount and/or Complexity of Data Reviewed Labs: ordered.     {Document critical care time when appropriate:1} {Document review of labs and clinical decision tools ie heart score, Chads2Vasc2 etc:1}  {Document your independent review of radiology images, and any outside records:1} {Document your discussion with family members, caretakers, and  with consultants:1} {Document social determinants of health affecting pt's care:1} {Document your decision making why or why not admission, treatments were needed:1} Final Clinical Impression(s) / ED Diagnoses Final diagnoses:  Acute diverticulitis    Rx / DC Orders ED Discharge Orders     None

## 2022-06-07 NOTE — ED Triage Notes (Signed)
Pt c/o lower right abd pain x 1 day with nausea, denies v/d/fevers; sent by urgent care

## 2022-06-07 NOTE — ED Provider Notes (Signed)
[facility] - URGENT CARE CENTER  Note:  This document was prepared using Systems analyst and may include unintentional dictation errors.  MRN: [id number] DOB: Jan 11, 1966  Subjective:   Justin Villa is a 57 y.o. male presenting for 1 day history of acute onset persistent and worsening right lower quadrant abdominal pain with fever.  Pain is rated 9 out of 10, is severe and constant.  No bloody stools, diarrhea, constipation, chest pain, shortness of breath, nausea, vomiting.  Has used Tylenol with some relief.  Has never had a colonoscopy.  No history of GI disorders.  No current facility-administered medications for this encounter.  Current Outpatient Medications:    aspirin EC 81 MG EC tablet, Take 1 tablet (81 mg total) by mouth daily., Disp: , Rfl:    atorvastatin (LIPITOR) 20 MG tablet, TAKE ONE TABLET BY MOUTH DAILY, Disp: 90 tablet, Rfl: 3   lisinopril (ZESTRIL) 40 MG tablet, TAKE ONE TABLET BY MOUTH DAILY, Disp: 90 tablet, Rfl: 3   metoprolol succinate (TOPROL-XL) 25 MG 24 hr tablet, TAKE ONE TABLET BY MOUTH DAILY, Disp: 90 tablet, Rfl: 3   nitroGLYCERIN (NITROSTAT) 0.4 MG SL tablet, Place 1 tablet (0.4 mg total) under the tongue every 5 (five) minutes x 3 doses as needed for chest pain (if no relief after 2nd dose, proceed to ED or call 911)., Disp: 25 tablet, Rfl: 3   pantoprazole (PROTONIX) 40 MG tablet, Take 1 tablet (40 mg total) by mouth daily., Disp: 90 tablet, Rfl: 3   polyethylene glycol (MIRALAX) 17 g packet, Take 17 g by mouth 2 (two) times daily., Disp: 14 each, Rfl: 0   clopidogrel (PLAVIX) 75 MG tablet, TAKE ONE TABLET BY MOUTH DAILY, Disp: 90 tablet, Rfl: 3   No Known Allergies  Past Medical History:  Diagnosis Date   CAD (coronary artery disease) 11/27/2015   a. S/p NSTEMI 6/17 >> s/p CABG // b. LHC 6/17: oLAD 50, mLAD 100, D1 70, D2 95, oLCx 95, mLCx 65, OM1 85, OM2 90, mRCA 85, dRCA 75, RPDA 40/50, RPLB2 50    Carotid stenosis    a.  Carotid US 6/17: bilat ICA 1-39%   Essential hypertension    History of non-ST elevation myocardial infarction (NSTEMI) 11/2015   Hyperlipidemia      Past Surgical History:  Procedure Laterality Date   BACK SURGERY     CARDIAC CATHETERIZATION N/A 11/26/2015   Procedure: Left Heart Cath and Coronary Angiography;  Surgeon: Belva Crome, MD;  Location: [place] CV LAB;  Service: Cardiovascular;  Laterality: N/A;   CORONARY ARTERY BYPASS GRAFT N/A 11/27/2015   Procedure: CORONARY ARTERY BYPASS GRAFTING (CABG) x 5 (LIMA to LAD, SVG to DIAGONAL, SVG SEQUENTIALLY to OM1 and OM2, SVG to PDA) with EVH from right leg using greater saphenous vein and mammary.;  Surgeon: Grace Isaac, MD;  Location: [place];  Service: Open Heart Surgery;  Laterality: N/A;   INCISION AND DRAINAGE ABSCESS Right 06/16/2020   Procedure: INCISION AND DRAINAGE ABSCESS RIGHT PINKY TOE (5th toe);  Surgeon: Carole Civil, MD;  Location: [facility];  Service: Orthopedics;  Laterality: Right;   INTRAOPERATIVE TRANSESOPHAGEAL ECHOCARDIOGRAM N/A 11/27/2015   Procedure: INTRAOPERATIVE TRANSESOPHAGEAL ECHOCARDIOGRAM;  Surgeon: Grace Isaac, MD;  Location: [place];  Service: Open Heart Surgery;  Laterality: N/A;   KNEE SURGERY      Family History  Problem Relation Age of Onset   Heart disease Father     Social History  Tobacco Use   Smoking status: Never   Smokeless tobacco: Never  Vaping Use   Vaping Use: Never used  Substance Use Topics   Alcohol use: Yes    Alcohol/week: 2.0 standard drinks of alcohol    Types: 2 Shots of liquor per week    Comment: everyother day   Drug use: No    ROS   Objective:   Vitals: BP 108/75 (BP Location: Right Arm)   Pulse 83   Temp (!) 100.8 F (38.2 C) (Oral)   Resp 18   Ht 6' (1.829 m)   Wt 220 lb (99.8 kg)   SpO2 96%   BMI 29.84 kg/m   Physical Exam Constitutional:      General: He is not in acute distress.    Appearance: Normal appearance. He is  well-developed and normal weight. He is not ill-appearing, toxic-appearing or diaphoretic.  HENT:     Head: Normocephalic and atraumatic.     Right Ear: External ear normal.     Left Ear: External ear normal.     Nose: Nose normal.     Mouth/Throat:     Pharynx: Oropharynx is clear.  Eyes:     General: No scleral icterus.       Right eye: No discharge.        Left eye: No discharge.     Extraocular Movements: Extraocular movements intact.  Cardiovascular:     Rate and Rhythm: Normal rate.  Pulmonary:     Effort: Pulmonary effort is normal.  Abdominal:     General: Bowel sounds are normal. There is no distension.     Palpations: Abdomen is soft. There is no mass.     Tenderness: There is abdominal tenderness in the right lower quadrant. There is guarding. There is no right CVA tenderness, left CVA tenderness or rebound.  Musculoskeletal:     Cervical back: Normal range of motion.  Neurological:     Mental Status: He is alert and oriented to person, place, and time.  Psychiatric:        Mood and Affect: Mood normal.        Behavior: Behavior normal.        Thought Content: Thought content normal.        Judgment: Judgment normal.     Assessment and Plan :   PDMP not reviewed this encounter.  1. Acute abdomen   2. RLQ abdominal pain     Patient is in need of a higher level of care than we can provide in the urgent care setting.  This includes rule out of an acute abdomen, peritonitis, diverticulitis, appendicitis but the differential is extensive.  Discussed this with patient and his wife, they are agreeable to present to the emergency room now for further testing and intervention.   Jaynee Eagles, [place] 06/07/22 1219

## 2022-06-07 NOTE — Discharge Instructions (Signed)
Your workup today shows that you have diverticulitis.  This is treated with antibiotics.  Take the medication as directed till finished.  I recommend fluids and bland diet.  Please keep your upcoming appointment with your gastroenterologist as you will likely need a colonoscopy in the near future.  Please return to the emergency department for any new or worsening symptoms.

## 2022-06-07 NOTE — ED Notes (Signed)
Patient is being discharged from the Urgent Care and sent to the Emergency Department via private vehicle . Per  PA, patient is in need of higher level of care due to right lower ABD pain with fever. Patient is aware and verbalizes understanding of plan of care.  Vitals:   06/07/22 1000  BP: 108/75  Pulse: 83  Resp: 18  Temp: (!) 100.8 F (38.2 C)  SpO2: 96%

## 2022-06-07 NOTE — Discharge Instructions (Signed)
Please head to the emergency room now for further testing than we can provide including consideration of a CT scan of the abdomen to rule out an acute abdomen, peritonitis, appendicitis, diverticulitis, etc.

## 2022-06-07 NOTE — ED Triage Notes (Signed)
Patient c/o RLQ pain x 1 day that has progressively gotten worse.  No vomiting or diarrhea, nausea.  No injury.  Patient has taken Tylenol and was able to rest last night.

## 2022-06-17 ENCOUNTER — Ambulatory Visit (INDEPENDENT_AMBULATORY_CARE_PROVIDER_SITE_OTHER): Payer: 59 | Admitting: Internal Medicine

## 2022-06-17 ENCOUNTER — Encounter: Payer: Self-pay | Admitting: Internal Medicine

## 2022-06-17 VITALS — BP 117/76 | HR 68 | Ht 72.0 in | Wt 231.0 lb

## 2022-06-17 DIAGNOSIS — R202 Paresthesia of skin: Secondary | ICD-10-CM

## 2022-06-17 DIAGNOSIS — I10 Essential (primary) hypertension: Secondary | ICD-10-CM | POA: Diagnosis not present

## 2022-06-17 DIAGNOSIS — E782 Mixed hyperlipidemia: Secondary | ICD-10-CM | POA: Diagnosis not present

## 2022-06-17 DIAGNOSIS — Z0001 Encounter for general adult medical examination with abnormal findings: Secondary | ICD-10-CM

## 2022-06-17 DIAGNOSIS — Z23 Encounter for immunization: Secondary | ICD-10-CM | POA: Diagnosis not present

## 2022-06-17 DIAGNOSIS — R400 Somnolence: Secondary | ICD-10-CM | POA: Diagnosis not present

## 2022-06-17 DIAGNOSIS — G5602 Carpal tunnel syndrome, left upper limb: Secondary | ICD-10-CM | POA: Diagnosis not present

## 2022-06-17 DIAGNOSIS — Z1321 Encounter for screening for nutritional disorder: Secondary | ICD-10-CM | POA: Diagnosis not present

## 2022-06-17 DIAGNOSIS — Z1329 Encounter for screening for other suspected endocrine disorder: Secondary | ICD-10-CM

## 2022-06-17 DIAGNOSIS — I2510 Atherosclerotic heart disease of native coronary artery without angina pectoris: Secondary | ICD-10-CM

## 2022-06-17 DIAGNOSIS — Z131 Encounter for screening for diabetes mellitus: Secondary | ICD-10-CM

## 2022-06-17 DIAGNOSIS — Z114 Encounter for screening for human immunodeficiency virus [HIV]: Secondary | ICD-10-CM | POA: Diagnosis not present

## 2022-06-17 DIAGNOSIS — Z9189 Other specified personal risk factors, not elsewhere classified: Secondary | ICD-10-CM | POA: Diagnosis not present

## 2022-06-17 DIAGNOSIS — R69 Illness, unspecified: Secondary | ICD-10-CM | POA: Diagnosis not present

## 2022-06-17 DIAGNOSIS — Z8719 Personal history of other diseases of the digestive system: Secondary | ICD-10-CM

## 2022-06-17 DIAGNOSIS — Z1159 Encounter for screening for other viral diseases: Secondary | ICD-10-CM

## 2022-06-17 DIAGNOSIS — R200 Anesthesia of skin: Secondary | ICD-10-CM

## 2022-06-17 MED ORDER — WRIST SPLINT/COCK-UP/LEFT L MISC: 1.0000 | Freq: Every evening | 0 refills | Status: DC

## 2022-06-17 NOTE — Patient Instructions (Signed)
It was a pleasure to see you today.  Thank you for giving [place] the opportunity to be involved in your care.  Below is a brief recap of your visit and next steps.  We will plan to see you again in 3 months.  Summary You have established care today We will check labs I have ordered a sleep study to screen for sleep apnea You have been referred to hand surgery for carpal tunnel syndrome I recommend a cock up wrist splint until you are able to see hand surgery We will follow up in 3 months

## 2022-06-17 NOTE — Progress Notes (Unsigned)
New Patient Office Visit  Subjective    Patient ID: Justin Villa, male    DOB: January 20, 1966  Age: 57 y.o. MRN: [id number]  CC:  Chief Complaint  Patient presents with   Establish Care   HPI [name] presents to establish care.  He is a 57 year old male with a past medical history significant for CAD s/p CABG x 5 (June 2017), GERD, HTN, HLD.  Mr. [name] is followed by cardiology, but has not had a PCP in many years.  He is accompanied by his girlfriend today.  His acute concern today is numbness in the first 3 digits of his left hand.  He describes constant numbness and an inability to pick up small objects.  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  He is interested in treatment options today.  His girlfriend also reports that he has recently become short of breath more easily and seems more fatigued.  He often fall asleep during the day after sitting down for a brief period of time.  Mr. [name] reports that he has not never fallen asleep while driving or at a stoplight.  He does endorse snoring nightly.  Acute concerns, chronic medical conditions, and outside preventive care items discussed today are individually addressed A/P below.  Outpatient Encounter Medications as of 06/17/2022  Medication Sig   aspirin EC 81 MG EC tablet Take 1 tablet (81 mg total) by mouth daily.   atorvastatin (LIPITOR) 20 MG tablet TAKE ONE TABLET BY MOUTH DAILY   Elastic Bandages & Supports (WRIST SPLINT/COCK-UP/LEFT L) MISC 1 each by Does not apply route at bedtime.   lisinopril (ZESTRIL) 40 MG tablet TAKE ONE TABLET BY MOUTH DAILY   metoprolol succinate (TOPROL-XL) 25 MG 24 hr tablet TAKE ONE TABLET BY MOUTH DAILY   nitroGLYCERIN (NITROSTAT) 0.4 MG SL tablet Place 1 tablet (0.4 mg total) under the tongue every 5 (five) minutes x 3 doses as needed for chest pain (if no relief after 2nd dose, proceed to ED or call 911).   pantoprazole  (PROTONIX) 40 MG tablet Take 1 tablet (40 mg total) by mouth daily.   polyethylene glycol (MIRALAX) 17 g packet Take 17 g by mouth 2 (two) times daily.   [DISCONTINUED] amoxicillin-clavulanate (AUGMENTIN) 875-125 MG tablet Take 1 tablet by mouth every 12 (twelve) hours.   [DISCONTINUED] clopidogrel (PLAVIX) 75 MG tablet TAKE ONE TABLET BY MOUTH DAILY   [DISCONTINUED] HYDROcodone-acetaminophen (NORCO/VICODIN) 5-325 MG tablet Take one tab po q 4 hrs prn pain   No facility-administered encounter medications on file as of 06/17/2022.    Past Medical History:  Diagnosis Date   CAD (coronary artery disease) 11/27/2015   a. S/p NSTEMI 6/17 >> s/p CABG // b. LHC 6/17: oLAD 50, mLAD 100, D1 70, D2 95, oLCx 95, mLCx 65, OM1 85, OM2 90, mRCA 85, dRCA 75, RPDA 40/50, RPLB2 50    Carotid stenosis    a. Carotid US 6/17: bilat ICA 1-39%   Essential hypertension    History of non-ST elevation myocardial infarction (NSTEMI) 11/2015   Hyperlipidemia     Past Surgical History:  Procedure Laterality Date   BACK SURGERY     CARDIAC CATHETERIZATION N/A 11/26/2015   Procedure: Left Heart Cath and Coronary Angiography;  Surgeon: Belva Crome, MD;  Location: [facility];  Service: Cardiovascular;  Laterality: N/A;   CORONARY ARTERY BYPASS GRAFT N/A 11/27/2015   Procedure:  CORONARY ARTERY BYPASS GRAFTING (CABG) x 5 (LIMA to LAD, SVG to DIAGONAL, SVG SEQUENTIALLY to OM1 and OM2, SVG to PDA) with EVH from right leg using greater saphenous vein and mammary.;  Surgeon: Grace Isaac, MD;  Location: [facility];  Service: Open Heart Surgery;  Laterality: N/A;   INCISION AND DRAINAGE ABSCESS Right 06/16/2020   Procedure: INCISION AND DRAINAGE ABSCESS RIGHT PINKY TOE (5th toe);  Surgeon: Carole Civil, MD;  Location: [facility];  Service: Orthopedics;  Laterality: Right;   INTRAOPERATIVE TRANSESOPHAGEAL ECHOCARDIOGRAM N/A 11/27/2015   Procedure: INTRAOPERATIVE TRANSESOPHAGEAL ECHOCARDIOGRAM;  Surgeon: Grace Isaac, MD;  Location: [facility];  Service: Open Heart Surgery;  Laterality: N/A;   KNEE SURGERY      Family History  Problem Relation Age of Onset   Heart disease Father     Social History   Socioeconomic History   Marital status: Single    Spouse name: Not on file   Number of children: Not on file   Years of education: Not on file   Highest education level: Not on file  Occupational History   Not on file  Tobacco Use   Smoking status: Never   Smokeless tobacco: Never  Vaping Use   Vaping Use: Never used  Substance and Sexual Activity   Alcohol use: Yes    Alcohol/week: 2.0 standard drinks of alcohol    Types: 2 Shots of liquor per week    Comment: everyother day   Drug use: No   Sexual activity: Yes  Other Topics Concern   Not on file  Social History Narrative   Not on file   Social Determinants of Health   Financial Resource Strain: Not on file  Food Insecurity: Not on file  Transportation Needs: Not on file  Physical Activity: Not on file  Stress: Not on file  Social Connections: Not on file  Intimate Partner Violence: Not on file    Review of Systems  Respiratory:  Positive for shortness of breath (With exertion).   Neurological:  Positive for tingling (Digits 1-3 of left hand) and weakness (Left hand).  All other systems reviewed and are negative.       Objective    BP 117/76   Pulse 68   Ht 6' (1.829 m)   Wt 231 lb (104.8 kg)   SpO2 98%   BMI 31.33 kg/m   Physical Exam Vitals reviewed.  Constitutional:      General: He is not in acute distress.    Appearance: Normal appearance. He is obese. He is not ill-appearing.  HENT:     Head: Normocephalic and atraumatic.     Right Ear: External ear normal.     Left Ear: External ear normal.     Nose: Nose normal. No congestion or rhinorrhea.     Mouth/Throat:     Mouth: Mucous membranes are moist.     Pharynx: Oropharynx is clear.  Eyes:     General: No scleral icterus.    Extraocular  Movements: Extraocular movements intact.     Conjunctiva/sclera: Conjunctivae normal.     Pupils: Pupils are equal, round, and reactive to light.  Cardiovascular:     Rate and Rhythm: Normal rate and regular rhythm.     Pulses: Normal pulses.     Heart sounds: Normal heart sounds. No murmur heard. Pulmonary:     Effort: Pulmonary effort is normal.     Breath sounds: Normal breath sounds. No wheezing, rhonchi or rales.  Abdominal:     General: Abdomen is flat. Bowel sounds are normal. There is no distension.     Palpations: Abdomen is soft.     Tenderness: There is no abdominal tenderness.  Musculoskeletal:        General: No swelling or deformity. Normal range of motion.     Cervical back: Normal range of motion.  Skin:    General: Skin is warm and dry.     Capillary Refill: Capillary refill takes less than 2 seconds.  Neurological:     General: No focal deficit present.     Mental Status: He is alert and oriented to person, place, and time.     Motor: Weakness (Grip strength 3/5 in left hand) present.     Comments: Negative Tennille's, unable to perform Phalen's due to arthritis of the left wrist  Psychiatric:        Mood and Affect: Mood normal.        Behavior: Behavior normal.        Thought Content: Thought content normal.   Last CBC Lab Results  Component Value Date   WBC 9.1 06/07/2022   HGB 14.9 06/07/2022   HCT 42.6 06/07/2022   MCV 87.5 06/07/2022   MCH 30.6 06/07/2022   RDW 12.6 06/07/2022   PLT 227 53/66/4403   Last metabolic panel Lab Results  Component Value Date   GLUCOSE 78 06/17/2022   NA 141 06/17/2022   K 4.4 06/17/2022   CL 104 06/17/2022   CO2 21 06/17/2022   BUN 16 06/17/2022   CREATININE 0.98 06/17/2022   GFRNONAA >60 06/07/2022   CALCIUM 9.3 06/17/2022   PROT 6.7 06/17/2022   ALBUMIN 4.2 06/17/2022   LABGLOB 2.5 06/17/2022   AGRATIO 1.7 06/17/2022   BILITOT 0.6 06/17/2022   ALKPHOS 61 06/17/2022   AST 21 06/17/2022   ALT 27  06/17/2022   ANIONGAP 7 06/07/2022   Last lipids Lab Results  Component Value Date   CHOL 156 06/17/2022   HDL 33 (L) 06/17/2022   LDLCALC 90 06/17/2022   TRIG 190 (H) 06/17/2022   CHOLHDL 4.7 06/17/2022   Last hemoglobin A1c Lab Results  Component Value Date   HGBA1C 5.7 (H) 06/17/2022   Last thyroid functions Lab Results  Component Value Date   TSH 2.190 06/17/2022    Assessment & Plan:   Problem List Items Addressed This Visit       CAD (coronary artery disease)    S/p CABG x 5 (June 2017).  Followed by cardiology (Dr. Domenic Polite).  Denies recent chest pain. -Continue ASA/statin/beta-blocker -He reports that Plavix was recently discontinued as it was believed to be negatively contributing to GERD symptoms      HTN (hypertension)    He is currently prescribed lisinopril 40 mg daily as well as metoprolol succinate 25 mg daily for treatment of hypertension.  His blood pressure today is 117/76. -No medication changes today      HLD (hyperlipidemia)    Lipid panel last updated in May 2021.  Total cholesterol 150 and LDL 90.  He is currently prescribed atorvastatin 20 mg daily. -Repeat lipid panel ordered today      Numbness and tingling in left hand    He describes a history of numbness in the first 3 digits of his left hand and has more recently developed weakness.  His grip strength in the left hand is diminished on exam today.  He has previously been told that this is due to  carpal tunnel syndrome.  Negative Tinel's on exam today.  Unable to perform Phalen's. -Refer to hand surgery for further evaluation -I have prescribed a cock up wrist splint in the interim until he is able to receive definitive treatment      History of diverticulitis    Recent ED presentation for right lower quadrant abdominal pain on 1/2.  His workup subsequently revealed diverticulitis.  He was treated with Augmentin and referred to gastroenterology.  Asymptomatic currently. -GI appointment  scheduled for 1/30      Encounter for general adult medical examination with abnormal findings - Primary    Presenting today to establish care.  Previous records and labs been reviewed. -Baseline labs ordered today, including one-time HIV/HCV screening -Tdap and zoster vaccines administered today -We will tentatively plan for follow-up in 3 months      Risk factors for obstructive sleep apnea    His girlfriend expresses concern for recently increased fatigue as well as constant snoring overnight.  He has been sleeping later and seems to fall asleep during the day after sitting in a comfortable position for a brief period of time.  He has not fallen asleep while driving and does not operate heavy machinery. -Home sleep test ordered today to evaluate for OSA      Return in about 3 months (around 09/16/2022).   Billie Lade, MD

## 2022-06-18 LAB — LIPID PANEL
Chol/HDL Ratio: 4.7 ratio (ref 0.0–5.0)
Cholesterol, Total: 156 mg/dL (ref 100–199)
HDL: 33 mg/dL — ABNORMAL LOW (ref >39)
LDL Chol Calc (NIH): 90 mg/dL (ref 0–99)
Triglycerides: 190 mg/dL — ABNORMAL HIGH (ref 0–149)
VLDL Cholesterol Cal: 33 mg/dL (ref 5–40)

## 2022-06-18 LAB — B12 AND FOLATE PANEL
Folate: 5.1 ng/mL (ref >3.0)
Vitamin B-12: 352 pg/mL (ref 232–1245)

## 2022-06-18 LAB — CMP14+EGFR
ALT: 27 IU/L (ref 0–44)
AST: 21 IU/L (ref 0–40)
Albumin/Globulin Ratio: 1.7 (ref 1.2–2.2)
Albumin: 4.2 g/dL (ref 3.8–4.9)
Alkaline Phosphatase: 61 IU/L (ref 44–121)
BUN/Creatinine Ratio: 16 (ref 9–20)
BUN: 16 mg/dL (ref 6–24)
Bilirubin Total: 0.6 mg/dL (ref 0.0–1.2)
CO2: 21 mmol/L (ref 20–29)
Calcium: 9.3 mg/dL (ref 8.7–10.2)
Chloride: 104 mmol/L (ref 96–106)
Creatinine, Ser: 0.98 mg/dL (ref 0.76–1.27)
Globulin, Total: 2.5 g/dL (ref 1.5–4.5)
Glucose: 78 mg/dL (ref 70–99)
Potassium: 4.4 mmol/L (ref 3.5–5.2)
Sodium: 141 mmol/L (ref 134–144)
Total Protein: 6.7 g/dL (ref 6.0–8.5)
eGFR: 89 mL/min/{1.73_m2} (ref >59)

## 2022-06-18 LAB — TSH+FREE T4
Free T4: 1.26 ng/dL (ref 0.82–1.77)
TSH: 2.19 u[IU]/mL (ref 0.450–4.500)

## 2022-06-18 LAB — HCV AB W REFLEX TO QUANT PCR: HCV Ab: NONREACTIVE

## 2022-06-18 LAB — HEMOGLOBIN A1C
Est. average glucose Bld gHb Est-mCnc: 117 mg/dL
Hgb A1c MFr Bld: 5.7 % — ABNORMAL HIGH (ref 4.8–5.6)

## 2022-06-18 LAB — HIV ANTIBODY (ROUTINE TESTING W REFLEX): HIV Screen 4th Generation wRfx: NONREACTIVE

## 2022-06-18 LAB — VITAMIN D 25 HYDROXY (VIT D DEFICIENCY, FRACTURES): Vit D, 25-Hydroxy: 26.8 ng/mL — ABNORMAL LOW (ref 30.0–100.0)

## 2022-06-18 LAB — HCV INTERPRETATION

## 2022-06-19 ENCOUNTER — Encounter: Payer: Self-pay | Admitting: Internal Medicine

## 2022-06-19 DIAGNOSIS — Z0001 Encounter for general adult medical examination with abnormal findings: Secondary | ICD-10-CM | POA: Insufficient documentation

## 2022-06-19 DIAGNOSIS — Z9189 Other specified personal risk factors, not elsewhere classified: Secondary | ICD-10-CM | POA: Insufficient documentation

## 2022-06-19 DIAGNOSIS — Z8719 Personal history of other diseases of the digestive system: Secondary | ICD-10-CM | POA: Insufficient documentation

## 2022-06-19 DIAGNOSIS — R200 Anesthesia of skin: Secondary | ICD-10-CM | POA: Insufficient documentation

## 2022-06-19 NOTE — Assessment & Plan Note (Signed)
Presenting today to establish care.  Previous records and labs been reviewed. -Baseline labs ordered today, including one-time HIV/HCV screening -Tdap and zoster vaccines administered today -We will tentatively plan for follow-up in 3 months

## 2022-06-19 NOTE — Assessment & Plan Note (Signed)
Lipid panel last updated in May 2021.  Total cholesterol 150 and LDL 90.  He is currently prescribed atorvastatin 20 mg daily. -Repeat lipid panel ordered today

## 2022-06-19 NOTE — Assessment & Plan Note (Addendum)
S/p CABG x 5 (June 2017).  Followed by cardiology (Dr. Domenic Polite).  Denies recent chest pain. -Continue ASA/statin/beta-blocker -He reports that Plavix was recently discontinued as it was believed to be negatively contributing to GERD symptoms

## 2022-06-19 NOTE — Assessment & Plan Note (Signed)
His girlfriend expresses concern for recently increased fatigue as well as constant snoring overnight.  He has been sleeping later and seems to fall asleep during the day after sitting in a comfortable position for a brief period of time.  He has not fallen asleep while driving and does not operate heavy machinery. -Home sleep test ordered today to evaluate for OSA

## 2022-06-19 NOTE — Assessment & Plan Note (Signed)
Recent ED presentation for right lower quadrant abdominal pain on 1/2.  His workup subsequently revealed diverticulitis.  He was treated with Augmentin and referred to gastroenterology.  Asymptomatic currently. -GI appointment scheduled for 1/30

## 2022-06-19 NOTE — Assessment & Plan Note (Signed)
He is currently prescribed lisinopril 40 mg daily as well as metoprolol succinate 25 mg daily for treatment of hypertension.  His blood pressure today is 117/76. -No medication changes today

## 2022-06-19 NOTE — Assessment & Plan Note (Signed)
He describes a history of numbness in the first 3 digits of his left hand and has more recently developed weakness.  His grip strength in the left hand is diminished on exam today.  He has previously been told that this is due to carpal tunnel syndrome.  Negative Tinel's on exam today.  Unable to perform Phalen's. -Refer to hand surgery for further evaluation -I have prescribed a cock up wrist splint in the interim until he is able to receive definitive treatment

## 2022-07-04 ENCOUNTER — Ambulatory Visit: Payer: 59 | Admitting: Orthopedic Surgery

## 2022-07-04 ENCOUNTER — Encounter: Payer: Self-pay | Admitting: Orthopedic Surgery

## 2022-07-04 VITALS — BP 131/79 | HR 62 | Ht 72.0 in | Wt 225.0 lb

## 2022-07-04 DIAGNOSIS — M75101 Unspecified rotator cuff tear or rupture of right shoulder, not specified as traumatic: Secondary | ICD-10-CM

## 2022-07-04 DIAGNOSIS — G5602 Carpal tunnel syndrome, left upper limb: Secondary | ICD-10-CM

## 2022-07-04 MED ORDER — METHYLPREDNISOLONE ACETATE 40 MG/ML IJ SUSP
40.0000 mg | Freq: Once | INTRAMUSCULAR | Status: AC
  Administered 2022-07-04: 40 mg via INTRA_ARTICULAR

## 2022-07-04 MED ORDER — B-6 100 MG PO TABS: 100.0000 mg | ORAL_TABLET | Freq: Two times a day (BID) | ORAL | 1 refills | Status: DC

## 2022-07-04 MED ORDER — METHYLPREDNISOLONE 4 MG PO TABS: 4.0000 mg | ORAL_TABLET | Freq: Two times a day (BID) | ORAL | 0 refills | Status: DC

## 2022-07-04 MED ORDER — GABAPENTIN 100 MG PO CAPS: 100.0000 mg | ORAL_CAPSULE | Freq: Every day | ORAL | 2 refills | Status: DC

## 2022-07-04 NOTE — Patient Instructions (Signed)
You have been referred to see Dr.Newton   [facility] [address] [place] PHONE: [phone] Cassandra is their referral coordinator and she will call you to schedule your initial visit. If you haven't heard from that office in about 1 week, please call them and schedule your appointment.   

## 2022-07-04 NOTE — Addendum Note (Signed)
Addended by: Obie Dredge A on: 07/04/2022 10:33 AM   Modules accepted: Orders

## 2022-07-04 NOTE — Progress Notes (Signed)
Chief Complaint  Patient presents with   Hand Pain    Left hand pain    56 right-hand-dominant Numbness and tingling thumb index long ring finger for about 6 months  Also complains of weakness when picking things up  Has night symptoms as well  Review of systems aching toothache like pain right shoulder with night pain painful forward elevation  Physical Exam Vitals and nursing note reviewed.  Constitutional:      Appearance: Normal appearance.  HENT:     Head: Normocephalic and atraumatic.  Eyes:     General: No scleral icterus.       Right eye: No discharge.        Left eye: No discharge.     Extraocular Movements: Extraocular movements intact.     Conjunctiva/sclera: Conjunctivae normal.     Pupils: Pupils are equal, round, and reactive to light.  Cardiovascular:     Rate and Rhythm: Normal rate.     Pulses: Normal pulses.  Musculoskeletal:     Comments: Right shoulder  Tenderness in the rotator interval, nontender posterior acromion and lateral deltoid  5- out of 5 abduction flexion  Positive empty  Normal active passive range of motion   Left hand Mild tenderness over the carpal tunnel No atrophy but grip strength weakness right to left Sensory loss median nerve distribution  Skin:    General: Skin is warm and dry.     Capillary Refill: Capillary refill takes less than 2 seconds.  Neurological:     General: No focal deficit present.     Mental Status: He is alert and oriented to person, place, and time.  Psychiatric:        Mood and Affect: Mood normal.        Behavior: Behavior normal.        Thought Content: Thought content normal.        Judgment: Judgment normal.     Procedure note the subacromial injection shoulder RIGHT    Verbal consent was obtained to inject the  RIGHT   Shoulder  Timeout was completed to confirm the injection site is a subacromial space of the  RIGHT  shoulder   Medication used Depo-Medrol 40 mg and lidocaine 1% 3  cc  Anesthesia was provided by ethyl chloride  The injection was performed in the RIGHT  posterior subacromial space. After pinning the skin with alcohol and anesthetized the skin with ethyl chloride the subacromial space was injected using a 20-gauge needle. There were no complications  Sterile dressing was applied.    Meds ordered this encounter  Medications   gabapentin (NEURONTIN) 100 MG capsule    Sig: Take 1 capsule (100 mg total) by mouth at bedtime.    Dispense:  30 capsule    Refill:  2   Pyridoxine HCl (B-6) 100 MG TABS    Sig: Take 100 mg by mouth in the morning and at bedtime.    Dispense:  60 tablet    Refill:  1   methylPREDNISolone (MEDROL) 4 MG tablet    Sig: Take 1 tablet (4 mg total) by mouth 2 (two) times daily.    Dispense:  20 tablet    Refill:  0    NCS   F/u after NCS

## 2022-07-05 ENCOUNTER — Encounter (INDEPENDENT_AMBULATORY_CARE_PROVIDER_SITE_OTHER): Payer: Self-pay | Admitting: *Deleted

## 2022-07-05 ENCOUNTER — Ambulatory Visit (INDEPENDENT_AMBULATORY_CARE_PROVIDER_SITE_OTHER): Payer: Self-pay | Admitting: Gastroenterology

## 2022-07-05 ENCOUNTER — Encounter: Payer: Self-pay | Admitting: Gastroenterology

## 2022-07-05 VITALS — BP 129/80 | HR 76 | Temp 96.2°F | Ht 72.0 in | Wt 231.0 lb

## 2022-07-05 DIAGNOSIS — K219 Gastro-esophageal reflux disease without esophagitis: Secondary | ICD-10-CM | POA: Insufficient documentation

## 2022-07-05 DIAGNOSIS — Z8719 Personal history of other diseases of the digestive system: Secondary | ICD-10-CM

## 2022-07-05 MED ORDER — PEG 3350-KCL-NA BICARB-NACL 420 G PO SOLR: 4000.0000 mL | Freq: Once | ORAL | 0 refills | Status: AC

## 2022-07-05 MED ORDER — PANTOPRAZOLE SODIUM 40 MG PO TBEC: 40.0000 mg | DELAYED_RELEASE_TABLET | Freq: Two times a day (BID) | ORAL | 3 refills | Status: DC

## 2022-07-05 NOTE — Progress Notes (Signed)
Gastroenterology Office Note    Referring Provider: Satira Sark, MD Primary Care Physician:  Johnette Abraham, MD  Primary GI: Dr. Abbey Chatters   Chief Complaint   Chief Complaint  Patient presents with   Abdominal Pain    Patient here today due to a recent Ed visit at [facility] due topain in the Lower right quadrant. He has bouts of constipation, he says he was given an antibiotic as he was thought to have acute diverticulitis. Patient states he is much better after finishing antibotics.     History of Present Illness   Justin Villa is a 57 y.o. male presenting today at the request of Dr. Domenic Polite with cardiology due to progressive GERD.   In interim from referral, he presented to the ED with abdominal pain. CT abd/pelvis with contrast Jun 07, 2022 with colonic diverticulosis, wall thickening and pericolic stranding in sigmoid colon in right side of pelvis suggest acute diverticulitis. Enlarged fatty liver also noted with enlarged spleen. Prescribed Augmentin by [facility] ED. No thrombocytopenia.   RLQ pain for past month prior to presentation. Completed 7 day course with improvement. Improved. Intermittent constipation but not bad lately. No weight loss or lack of appetite.   Chronic GERD. Pantoprazole daily for GERD, prescribed by cardiology. No dysphagia. He still has breakthrough GERD symptoms.   No prior colonoscopy or EGD.  No FH colon cancer or polyps.   Past Medical History:  Diagnosis Date   CAD (coronary artery disease) 11/27/2015   a. S/p NSTEMI 6/17 >> s/p CABG // b. LHC 6/17: oLAD 50, mLAD 100, D1 70, D2 95, oLCx 95, mLCx 65, OM1 85, OM2 90, mRCA 85, dRCA 75, RPDA 40/50, RPLB2 50    Carotid stenosis    a. Carotid US 6/17: bilat ICA 1-39%   Essential hypertension    History of non-ST elevation myocardial infarction (NSTEMI) 11/2015   Hyperlipidemia     Past Surgical History:  Procedure Laterality Date   BACK SURGERY     CARDIAC CATHETERIZATION N/A 11/26/2015    Procedure: Left Heart Cath and Coronary Angiography;  Surgeon: Belva Crome, MD;  Location: [facility];  Service: Cardiovascular;  Laterality: N/A;   CORONARY ARTERY BYPASS GRAFT N/A 11/27/2015   Procedure: CORONARY ARTERY BYPASS GRAFTING (CABG) x 5 (LIMA to LAD, SVG to DIAGONAL, SVG SEQUENTIALLY to OM1 and OM2, SVG to PDA) with EVH from right leg using greater saphenous vein and mammary.;  Surgeon: Grace Isaac, MD;  Location: [facility];  Service: Open Heart Surgery;  Laterality: N/A;   INCISION AND DRAINAGE ABSCESS Right 06/16/2020   Procedure: INCISION AND DRAINAGE ABSCESS RIGHT PINKY TOE (5th toe);  Surgeon: Carole Civil, MD;  Location: [facility];  Service: Orthopedics;  Laterality: Right;   INTRAOPERATIVE TRANSESOPHAGEAL ECHOCARDIOGRAM N/A 11/27/2015   Procedure: INTRAOPERATIVE TRANSESOPHAGEAL ECHOCARDIOGRAM;  Surgeon: Grace Isaac, MD;  Location: [facility];  Service: Open Heart Surgery;  Laterality: N/A;   KNEE SURGERY      Current Outpatient Medications  Medication Sig Dispense Refill   aspirin EC 81 MG EC tablet Take 1 tablet (81 mg total) by mouth daily.     atorvastatin (LIPITOR) 20 MG tablet TAKE ONE TABLET BY MOUTH DAILY 90 tablet 3   Elastic Bandages & Supports (WRIST SPLINT/COCK-UP/LEFT L) MISC 1 each by Does not apply route at bedtime. 1 each 0   gabapentin (NEURONTIN) 100 MG capsule Take 1 capsule (100 mg total) by mouth at bedtime.  30 capsule 2   lisinopril (ZESTRIL) 40 MG tablet TAKE ONE TABLET BY MOUTH DAILY 90 tablet 3   methylPREDNISolone (MEDROL) 4 MG tablet Take 1 tablet (4 mg total) by mouth 2 (two) times daily. 20 tablet 0   metoprolol succinate (TOPROL-XL) 25 MG 24 hr tablet TAKE ONE TABLET BY MOUTH DAILY 90 tablet 3   nitroGLYCERIN (NITROSTAT) 0.4 MG SL tablet Place 1 tablet (0.4 mg total) under the tongue every 5 (five) minutes x 3 doses as needed for chest pain (if no relief after 2nd dose, proceed to ED or call 911). 25 tablet 3   pantoprazole  (PROTONIX) 40 MG tablet Take 1 tablet (40 mg total) by mouth daily. 90 tablet 3   Pyridoxine HCl (B-6) 100 MG TABS Take 100 mg by mouth in the morning and at bedtime. 60 tablet 1   polyethylene glycol (MIRALAX) 17 g packet Take 17 g by mouth 2 (two) times daily. (Patient not taking: Reported on 07/05/2022) 14 each 0   No current facility-administered medications for this visit.    Allergies as of 07/05/2022   (No Known Allergies)    Family History  Problem Relation Age of Onset   Heart disease Father     Social History   Socioeconomic History   Marital status: Single    Spouse name: Not on file   Number of children: Not on file   Years of education: Not on file   Highest education level: Not on file  Occupational History   Not on file  Tobacco Use   Smoking status: Never   Smokeless tobacco: Never  Vaping Use   Vaping Use: Never used  Substance and Sexual Activity   Alcohol use: Yes    Alcohol/week: 2.0 standard drinks of alcohol    Types: 2 Shots of liquor per week    Comment: everyother day   Drug use: No   Sexual activity: Yes  Other Topics Concern   Not on file  Social History Narrative   Not on file   Social Determinants of Health   Financial Resource Strain: Not on file  Food Insecurity: Not on file  Transportation Needs: Not on file  Physical Activity: Not on file  Stress: Not on file  Social Connections: Not on file  Intimate Partner Violence: Not on file     Review of Systems   Gen: Denies any fever, chills, fatigue, weight loss, lack of appetite.  CV: Denies chest pain, heart palpitations, peripheral edema, syncope.  Resp: Denies shortness of breath at rest or with exertion. Denies wheezing or cough.  GI: see HPI GU : Denies urinary burning, urinary frequency, urinary hesitancy MS: Denies joint pain, muscle weakness, cramps, or limitation of movement.  Derm: Denies rash, itching, dry skin Psych: Denies depression, anxiety, memory loss, and  confusion Heme: Denies bruising, bleeding, and enlarged lymph nodes.   Physical Exam   BP 129/80 (BP Location: Left Arm, Patient Position: Sitting, Cuff Size: Large)   Pulse 76   Temp (!) 96.2 F (35.7 C) (Temporal)   Ht 6' (1.829 m)   Wt 231 lb (104.8 kg)   BMI 31.33 kg/m  General:   Alert and oriented. Pleasant and cooperative. Well-nourished and well-developed.  Head:  Normocephalic and atraumatic. Eyes:  Without icterus Ears:  Normal auditory acuity. Lungs:  Clear to auscultation bilaterally.  Heart:  S1, S2 present without murmurs appreciated.  Abdomen:  +BS, soft, non-tender and non-distended. No HSM noted. No guarding or rebound. No  masses appreciated.  Rectal:  Deferred  Msk:  Symmetrical without gross deformities. Normal posture. Extremities:  Without edema. Neurologic:  Alert and  oriented x4;  grossly normal neurologically. Skin:  Intact without significant lesions or rashes. Psych:  Alert and cooperative. Normal mood and affect.   Assessment   Justin Villa is a 57 y.o. male presenting today  at the request of Dr. Domenic Polite with cardiology due to progressive GERD. He also had recent bout with diverticulitis on 06/07/22, s/p course of Augmentin.   GERD: chronic. He is now on pantoprazole daily but not ideally controlled. No dysphagia. Due to chronicity of symptoms, will set up screening EGD for Barrett's.   Diverticulitis: now resolved s/p abx. No prior colonoscopy. Will arrange colonoscopy in near future. No FH colon cancer or polyps.  Enlarged fatty liver on imaging: with enlarged spleen. No thrombocytopenia. He does drink alcohol every other day. Discussed diet/behavior modification and risk of progression to advanced liver disease.      PLAN   Increase PPI to BID for now Add Benefiber 2 teaspoons daily Proceed with colonoscopy//EGD by Dr. Abbey Chatters  in near future: the risks, benefits, and alternatives have been discussed with the patient in detail. The  patient states understanding and desires to proceed.  Diet/behavior changes regarding fatty liver discussed    Annitta Needs, PhD, ANP-BC [facility]

## 2022-07-05 NOTE — Patient Instructions (Signed)
For reflux: I have increased pantoprazole to twice a day for now. Take 30 minutes before breakfast and dinner.  For diverticulitis: eat a high fiber diet. I also recommend Benefiber 2 teaspoons each morning mixed in the beverage of your choice. Avoid constipation.   We are arranging a colonoscopy and upper endoscopy by Dr. Abbey Chatters in the near future.  Further recommendations to follow!  It was a pleasure to see you today. I want to create trusting relationships with patients and provide genuine, compassionate, and quality care. I truly value your feedback, so please be on the lookout for a survey regarding your visit with me today. I appreciate your time in completing this!    Annitta Needs, PhD, ANP-BC [facility]

## 2022-07-11 ENCOUNTER — Other Ambulatory Visit: Payer: Self-pay

## 2022-07-11 ENCOUNTER — Encounter (HOSPITAL_COMMUNITY): Payer: Self-pay

## 2022-07-11 ENCOUNTER — Encounter (HOSPITAL_COMMUNITY)
Admission: RE | Admit: 2022-07-11 | Discharge: 2022-07-11 | Disposition: A | Discharge status: Home / Self Care | Payer: 59 | Source: Ambulatory Visit | Attending: Internal Medicine | Admitting: Internal Medicine

## 2022-07-11 HISTORY — DX: Gastro-esophageal reflux disease without esophagitis: K21.9

## 2022-07-12 ENCOUNTER — Ambulatory Visit (INDEPENDENT_AMBULATORY_CARE_PROVIDER_SITE_OTHER): Payer: 59 | Admitting: Physical Medicine and Rehabilitation

## 2022-07-12 DIAGNOSIS — R202 Paresthesia of skin: Secondary | ICD-10-CM | POA: Diagnosis not present

## 2022-07-12 NOTE — Progress Notes (Signed)
Functional Pain Scale - descriptive words and definitions  No Pain (0)   No Pain/Loss of function  Average Pain 0  Right handed. Numbness in left hand on index, middle and ring finger and is starting to go into the thumb. Hard to grasp things in left hand

## 2022-07-14 ENCOUNTER — Telehealth: Payer: Self-pay | Admitting: *Deleted

## 2022-07-14 NOTE — Telephone Encounter (Signed)
Patient procedure today cancelled because he was sick. He has been rescheduled to 08/08/22 at [place] will send new instructions via mychart and new pre-op appt. Already has prep at home.

## 2022-07-14 NOTE — Telephone Encounter (Signed)
Wife called back. Patient needed to reschedule procedure. He has been moved to 3/21 at 730am. Aware will send updated instructions/pre-op appt.

## 2022-07-15 ENCOUNTER — Encounter: Payer: Self-pay | Admitting: *Deleted

## 2022-07-18 NOTE — Procedures (Unsigned)
EMG & NCV Findings: Evaluation of the right median motor nerve showed prolonged distal onset latency (6.5 ms) and decreased conduction velocity (Elbow-Wrist, 47 m/s).  The right ulnar motor nerve showed decreased conduction velocity (B Elbow-Wrist, 47 m/s) and decreased conduction velocity (A Elbow-B Elbow, 50 m/s).  The right median (across palm) sensory nerve showed prolonged distal peak latency (Wrist, 7.0 ms), reduced amplitude (4.2 V), and prolonged distal peak latency (Palm, 7.8 ms).  All remaining nerves (as indicated in the following tables) were within normal limits.    All examined muscles (as indicated in the following table) showed no evidence of electrical instability.    Impression: The above electrodiagnostic study is ABNORMAL and reveals evidence of a severe right median nerve entrapment at the wrist (carpal tunnel syndrome) affecting sensory and motor components. There is no significant electrodiagnostic evidence of any other focal nerve entrapment, brachial plexopathy or cervical radiculopathy.   Recommendations: 1.  Follow-up with referring physician. 2.  Continue current management of symptoms. 3.  Suggest MRI and surgical evaluation. 4.  Suggest surgical evaluation.  ___________________________ Laurence Spates FAAPMR Board Certified, American Board of Physical Medicine and Rehabilitation    Nerve Conduction Studies Anti Sensory Summary Table   Stim Site NR Peak (ms) Norm Peak (ms) P-T Amp (V) Norm P-T Amp Site1 Site2 Delta-P (ms) Dist (cm) Vel (m/s) Norm Vel (m/s)  Right Median Acr Palm Anti Sensory (2nd Digit)  31.5C  Wrist    *7.0 <3.6 *4.2 >10 Wrist Palm 0.8 0.0    Palm    *7.8 <2.0 10.5         Right Radial Anti Sensory (Base 1st Digit)  31.9C  Wrist    2.2 <3.1 31.0  Wrist Base 1st Digit 2.2 0.0    Right Ulnar Anti Sensory (5th Digit)  32.3C  Wrist    3.4 <3.7 15.0 >15.0 Wrist 5th Digit 3.4 14.0 41 >38   Motor Summary Table   Stim Site NR Onset (ms) Norm  Onset (ms) O-P Amp (mV) Norm O-P Amp Site1 Site2 Delta-0 (ms) Dist (cm) Vel (m/s) Norm Vel (m/s)  Right Median Motor (Abd Poll Brev)  32.1C  Wrist    *6.5 <4.2 6.1 >5 Elbow Wrist 5.1 24.0 *47 >50  Elbow    11.6  5.9         Right Ulnar Motor (Abd Dig Min)  32.5C  Wrist    3.0 <4.2 9.7 >3 B Elbow Wrist 4.6 21.5 *47 >53  B Elbow    7.6  9.1  A Elbow B Elbow 2.2 11.0 *50 >53  A Elbow    9.8  8.3          EMG   Side Muscle Nerve Root Ins Act Fibs Psw Amp Dur Poly Recrt Int Fraser Din Comment  Right Abd Poll Brev Median C8-T1 Nml Nml Nml Nml Nml 0 Nml Nml   Right 1stDorInt Ulnar C8-T1 Nml Nml Nml Nml Nml 0 Nml Nml   Right PronatorTeres Median C6-7 Nml Nml Nml Nml Nml 0 Nml Nml   Right Biceps Musculocut C5-6 Nml Nml Nml Nml Nml 0 Nml Nml   Right Deltoid Axillary C5-6 Nml Nml Nml Nml Nml 0 Nml Nml     Nerve Conduction Studies Anti Sensory Left/Right Comparison   Stim Site L Lat (ms) R Lat (ms) L-R Lat (ms) L Amp (V) R Amp (V) L-R Amp (%) Site1 Site2 L Vel (m/s) R Vel (m/s) L-R Vel (m/s)  Median Acr Palm Anti Sensory (  2nd Digit)  31.5C  Wrist  *7.0   *4.2  Wrist Palm     Palm  *7.8   10.5        Radial Anti Sensory (Base 1st Digit)  31.9C  Wrist  2.2   31.0  Wrist Base 1st Digit     Ulnar Anti Sensory (5th Digit)  32.3C  Wrist  3.4   15.0  Wrist 5th Digit  41    Motor Left/Right Comparison   Stim Site L Lat (ms) R Lat (ms) L-R Lat (ms) L Amp (mV) R Amp (mV) L-R Amp (%) Site1 Site2 L Vel (m/s) R Vel (m/s) L-R Vel (m/s)  Median Motor (Abd Poll Brev)  32.1C  Wrist  *6.5   6.1  Elbow Wrist  *47   Elbow  11.6   5.9        Ulnar Motor (Abd Dig Min)  32.5C  Wrist  3.0   9.7  B Elbow Wrist  *47   B Elbow  7.6   9.1  A Elbow B Elbow  *50   A Elbow  9.8   8.3           Waveforms:

## 2022-07-19 NOTE — Progress Notes (Signed)
Justin Villa - 57 y.o. male MRN [id number]  Date of birth: Nov 03, 1965  Office Visit Note: Visit Date: 07/12/2022 PCP: Johnette Abraham, MD Referred by: Carole Civil, MD  Subjective: Chief Complaint  Patient presents with   Left Hand - Numbness, Weakness   HPI:  Justin Villa is a 57 y.o. male who comes in today at the request of Dr. Arther Abbott for evaluation and management of chronic, worsening and severe pain, numbness and tingling in the Right upper extremities.  Patient is Right hand dominant.  He reports chronic worsening severe pain numbness and tingling in the left hand and index finger middle and ring finger in a pretty classic median nerve distribution.  Case is complicated by coronary artery disease status post STEMI but no diabetes.  Denies any radicular complaints.   ROS Otherwise per HPI.  Assessment & Plan: Visit Diagnoses:    ICD-10-CM   1. Paresthesia of skin  R20.2 NCV with EMG (electromyography)      Plan: Impression: The above electrodiagnostic study is ABNORMAL and reveals evidence of a severe Left median nerve entrapment at the wrist (carpal tunnel syndrome) affecting sensory and motor components. There is no significant electrodiagnostic evidence of any other focal nerve entrapment, brachial plexopathy or cervical radiculopathy.   Recommendations: 1.  Follow-up with referring physician. 2.  Continue current management of symptoms. 3.  Suggest MRI and surgical evaluation. 4.  Suggest surgical evaluation.  Meds & Orders: No orders of the defined types were placed in this encounter.   Orders Placed This Encounter  Procedures   NCV with EMG (electromyography)    Follow-up: Return for Arther Abbott, MD.   Procedures: No procedures performed  EMG & NCV Findings: Evaluation of the Left median motor nerve showed prolonged distal onset latency (6.5 ms) and decreased conduction velocity (Elbow-Wrist, 47 m/s).  The Left ulnar motor nerve  showed decreased conduction velocity (B Elbow-Wrist, 47 m/s) and decreased conduction velocity (A Elbow-B Elbow, 50 m/s).  The Left median (across palm) sensory nerve showed prolonged distal peak latency (Wrist, 7.0 ms), reduced amplitude (4.2 V), and prolonged distal peak latency (Palm, 7.8 ms).  All remaining nerves (as indicated in the following tables) were within normal limits.    All examined muscles (as indicated in the following table) showed no evidence of electrical instability.    Impression: The above electrodiagnostic study is ABNORMAL and reveals evidence of a severe Left median nerve entrapment at the wrist (carpal tunnel syndrome) affecting sensory and motor components. There is no significant electrodiagnostic evidence of any other focal nerve entrapment, brachial plexopathy or cervical radiculopathy.   Recommendations: 1.  Follow-up with referring physician. 2.  Continue current management of symptoms. 3.  Suggest MRI and surgical evaluation. 4.  Suggest surgical evaluation.  ___________________________ Laurence Spates FAAPMR Board Certified, American Board of Physical Medicine and Rehabilitation    Nerve Conduction Studies Anti Sensory Summary Table   Stim Site NR Peak (ms) Norm Peak (ms) P-T Amp (V) Norm P-T Amp Site1 Site2 Delta-P (ms) Dist (cm) Vel (m/s) Norm Vel (m/s)  Left Median Acr Palm Anti Sensory (2nd Digit)  31.5C  Wrist    *7.0 <3.6 *4.2 >10 Wrist Palm 0.8 0.0    Palm    *7.8 <2.0 10.5         Left Radial Anti Sensory (Base 1st Digit)  31.9C  Wrist    2.2 <3.1 31.0  Wrist Base 1st Digit 2.2 0.0    Left  Ulnar Anti Sensory (5th Digit)  32.3C  Wrist    3.4 <3.7 15.0 >15.0 Wrist 5th Digit 3.4 14.0 41 >38   Motor Summary Table   Stim Site NR Onset (ms) Norm Onset (ms) O-P Amp (mV) Norm O-P Amp Site1 Site2 Delta-0 (ms) Dist (cm) Vel (m/s) Norm Vel (m/s)  Left Median Motor (Abd Poll Brev)  32.1C  Wrist    *6.5 <4.2 6.1 >5 Elbow Wrist 5.1 24.0 *47 >50   Elbow    11.6  5.9         Left Ulnar Motor (Abd Dig Min)  32.5C  Wrist    3.0 <4.2 9.7 >3 B Elbow Wrist 4.6 21.5 *47 >53  B Elbow    7.6  9.1  A Elbow B Elbow 2.2 11.0 *50 >53  A Elbow    9.8  8.3          EMG   Side Muscle Nerve Root Ins Act Fibs Psw Amp Dur Poly Recrt Int Fraser Din Comment  Left Abd Poll Brev Median C8-T1 Nml Nml Nml Nml Nml 0 Nml Nml   Left 1stDorInt Ulnar C8-T1 Nml Nml Nml Nml Nml 0 Nml Nml   Left PronatorTeres Median C6-7 Nml Nml Nml Nml Nml 0 Nml Nml   Left Biceps Musculocut C5-6 Nml Nml Nml Nml Nml 0 Nml Nml   Left Deltoid Axillary C5-6 Nml Nml Nml Nml Nml 0 Nml Nml     Nerve Conduction Studies Anti Sensory Left/Right Comparison   Stim Site L Lat (ms) R Lat (ms) L-R Lat (ms) R Amp (V) L Amp (V) L-R Amp (%) Site1 Site2 L Vel (m/s) R Vel (m/s) L-R Vel (m/s)  Median Acr Palm Anti Sensory (2nd Digit)  31.5C  Wrist  *7.0   *4.2  Wrist Palm     Palm  *7.8   10.5        Radial Anti Sensory (Base 1st Digit)  31.9C  Wrist  2.2   31.0  Wrist Base 1st Digit     Ulnar Anti Sensory (5th Digit)  32.3C  Wrist  3.4   15.0  Wrist 5th Digit  41    Motor Left/Right Comparison   Stim Site R Lat (ms) L Lat (ms) L-R Lat (ms) L Amp (mV) R Amp (mV) L-R Amp (%) Site1 Site2 L Vel (m/s) R Vel (m/s) L-R Vel (m/s)  Median Motor (Abd Poll Brev)  32.1C  Wrist  *6.5   6.1  Elbow Wrist  *47   Elbow  11.6   5.9        Ulnar Motor (Abd Dig Min)  32.5C  Wrist  3.0   9.7  B Elbow Wrist  *47   B Elbow  7.6   9.1  A Elbow B Elbow  *50   A Elbow  9.8   8.3           Waveforms:             Clinical History: No specialty comments available.     Objective:  VS:  HT:    WT:   BMI:     BP:   HR: bpm  TEMP: ( )  RESP:  Physical Exam Musculoskeletal:        General: No tenderness.     Comments: Inspection reveals no atrophy of the bilateral APB or FDI or hand intrinsics. There is no swelling, color changes, allodynia or dystrophic changes. There is 5 out of 5 strength in  the bilateral wrist extension, finger abduction and long finger flexion. There is impaired sensation to light touch in the left median nerve distribution. There is a negative Froment's test bilaterally. There is a negative Tinel's test at the bilateral wrist and elbow. There is a positive Phalen's test on the Left. There is a negative Hoffmann's test bilaterally.  Skin:    General: Skin is warm and dry.     Findings: No erythema or rash.  Neurological:     General: No focal deficit present.     Mental Status: He is alert and oriented to person, place, and time.     Sensory: No sensory deficit.     Motor: No weakness or abnormal muscle tone.     Coordination: Coordination normal.     Gait: Gait normal.  Psychiatric:        Mood and Affect: Mood normal.        Behavior: Behavior normal.        Thought Content: Thought content normal.      Imaging: No results found.

## 2022-08-04 ENCOUNTER — Encounter: Payer: Self-pay | Admitting: Radiology

## 2022-08-04 ENCOUNTER — Other Ambulatory Visit (HOSPITAL_COMMUNITY): Payer: 59

## 2022-08-08 ENCOUNTER — Telehealth: Payer: Self-pay

## 2022-08-08 NOTE — Telephone Encounter (Signed)
Pt approved for Pantoprazole 08/08/2022 to 08/08/2023

## 2022-08-08 NOTE — Telephone Encounter (Signed)
PA done for Pantoprazole Sodium 40 mg. Dx code used: K21.9. ov notes faxed. Waiting on a response from Cover My meds

## 2022-08-08 NOTE — Telephone Encounter (Signed)
Pt approved for his Rx for Pantoprazole. Given to Miller for scan. Pt notified that Rx was approved

## 2022-08-22 ENCOUNTER — Encounter (HOSPITAL_COMMUNITY): Payer: Self-pay

## 2022-08-22 ENCOUNTER — Encounter (HOSPITAL_COMMUNITY)
Admission: RE | Admit: 2022-08-22 | Discharge: 2022-08-22 | Disposition: A | Discharge status: Home / Self Care | Payer: 59 | Source: Ambulatory Visit | Attending: Internal Medicine | Admitting: Internal Medicine

## 2022-08-22 ENCOUNTER — Other Ambulatory Visit: Payer: Self-pay

## 2022-08-25 ENCOUNTER — Encounter (HOSPITAL_COMMUNITY)
Admission: RE | Disposition: A | Discharge status: Home / Self Care | Payer: Self-pay | Source: Home / Self Care | Attending: Internal Medicine

## 2022-08-25 ENCOUNTER — Ambulatory Visit (HOSPITAL_BASED_OUTPATIENT_CLINIC_OR_DEPARTMENT_OTHER): Payer: 59 | Admitting: Anesthesiology

## 2022-08-25 ENCOUNTER — Ambulatory Visit (HOSPITAL_COMMUNITY): Payer: 59 | Admitting: Anesthesiology

## 2022-08-25 ENCOUNTER — Ambulatory Visit (HOSPITAL_COMMUNITY)
Admission: RE | Admit: 2022-08-25 | Discharge: 2022-08-25 | Disposition: A | Discharge status: Home / Self Care | Payer: 59 | Attending: Internal Medicine | Admitting: Internal Medicine

## 2022-08-25 ENCOUNTER — Encounter (HOSPITAL_COMMUNITY): Payer: Self-pay

## 2022-08-25 DIAGNOSIS — D120 Benign neoplasm of cecum: Secondary | ICD-10-CM

## 2022-08-25 DIAGNOSIS — Z09 Encounter for follow-up examination after completed treatment for conditions other than malignant neoplasm: Secondary | ICD-10-CM | POA: Insufficient documentation

## 2022-08-25 DIAGNOSIS — I252 Old myocardial infarction: Secondary | ICD-10-CM | POA: Insufficient documentation

## 2022-08-25 DIAGNOSIS — I10 Essential (primary) hypertension: Secondary | ICD-10-CM | POA: Diagnosis not present

## 2022-08-25 DIAGNOSIS — K2289 Other specified disease of esophagus: Secondary | ICD-10-CM | POA: Diagnosis not present

## 2022-08-25 DIAGNOSIS — K2970 Gastritis, unspecified, without bleeding: Secondary | ICD-10-CM | POA: Diagnosis not present

## 2022-08-25 DIAGNOSIS — D125 Benign neoplasm of sigmoid colon: Secondary | ICD-10-CM

## 2022-08-25 DIAGNOSIS — K2270 Barrett's esophagus without dysplasia: Secondary | ICD-10-CM | POA: Diagnosis not present

## 2022-08-25 DIAGNOSIS — I2510 Atherosclerotic heart disease of native coronary artery without angina pectoris: Secondary | ICD-10-CM | POA: Insufficient documentation

## 2022-08-25 DIAGNOSIS — K449 Diaphragmatic hernia without obstruction or gangrene: Secondary | ICD-10-CM

## 2022-08-25 DIAGNOSIS — G4730 Sleep apnea, unspecified: Secondary | ICD-10-CM | POA: Insufficient documentation

## 2022-08-25 DIAGNOSIS — Z951 Presence of aortocoronary bypass graft: Secondary | ICD-10-CM | POA: Insufficient documentation

## 2022-08-25 DIAGNOSIS — K5730 Diverticulosis of large intestine without perforation or abscess without bleeding: Secondary | ICD-10-CM | POA: Insufficient documentation

## 2022-08-25 DIAGNOSIS — K648 Other hemorrhoids: Secondary | ICD-10-CM | POA: Diagnosis not present

## 2022-08-25 DIAGNOSIS — K219 Gastro-esophageal reflux disease without esophagitis: Secondary | ICD-10-CM | POA: Diagnosis not present

## 2022-08-25 DIAGNOSIS — K5732 Diverticulitis of large intestine without perforation or abscess without bleeding: Secondary | ICD-10-CM

## 2022-08-25 DIAGNOSIS — Z8719 Personal history of other diseases of the digestive system: Secondary | ICD-10-CM

## 2022-08-25 HISTORY — PX: POLYPECTOMY: SHX5525

## 2022-08-25 HISTORY — PX: ESOPHAGOGASTRODUODENOSCOPY (EGD) WITH PROPOFOL: SHX5813

## 2022-08-25 HISTORY — PX: BIOPSY: SHX5522

## 2022-08-25 HISTORY — PX: COLONOSCOPY WITH PROPOFOL: SHX5780

## 2022-08-25 SURGERY — COLONOSCOPY WITH PROPOFOL
Anesthesia: General

## 2022-08-25 MED ORDER — PROPOFOL 500 MG/50ML IV EMUL
INTRAVENOUS | Status: DC | PRN
  Administered 2022-08-25: 100 ug/kg/min via INTRAVENOUS

## 2022-08-25 MED ORDER — PROPOFOL 10 MG/ML IV BOLUS
INTRAVENOUS | Status: DC | PRN
  Administered 2022-08-25: 120 mg via INTRAVENOUS
  Administered 2022-08-25: 30 mg via INTRAVENOUS
  Administered 2022-08-25: 40 mg via INTRAVENOUS
  Administered 2022-08-25: 50 mg via INTRAVENOUS

## 2022-08-25 MED ORDER — LIDOCAINE HCL (CARDIAC) PF 100 MG/5ML IV SOSY
PREFILLED_SYRINGE | INTRAVENOUS | Status: DC | PRN
  Administered 2022-08-25: 100 mg via INTRAVENOUS

## 2022-08-25 MED ORDER — LACTATED RINGERS IV SOLN: INTRAVENOUS | Status: DC

## 2022-08-25 MED ORDER — STERILE WATER FOR IRRIGATION IR SOLN
Status: DC | PRN
  Administered 2022-08-25: 60 mL

## 2022-08-25 NOTE — Anesthesia Procedure Notes (Signed)
Date/Time: 08/25/2022 7:33 AM  Performed by: Vista Deck, CRNAPre-anesthesia Checklist: Patient identified, Emergency Drugs available, Suction available, Timeout performed and Patient being monitored Patient Re-evaluated:Patient Re-evaluated prior to induction Oxygen Delivery Method: Nasal Cannula

## 2022-08-25 NOTE — Transfer of Care (Signed)
Immediate Anesthesia Transfer of Care Note  Patient: Justin Villa  Procedure(s) Performed: COLONOSCOPY WITH PROPOFOL ESOPHAGOGASTRODUODENOSCOPY (EGD) WITH PROPOFOL BIOPSY POLYPECTOMY  Patient Location: Endoscopy Unit  Anesthesia Type:General  Level of Consciousness: awake and patient cooperative  Airway & Oxygen Therapy: Patient Spontanous Breathing  Post-op Assessment: Report given to RN and Post -op Vital signs reviewed and stable  Post vital signs: Reviewed and stable  Last Vitals:  Vitals Value Taken Time  BP 105/76 0815  Temp 97.4 0815  Pulse 75 0815  Resp 22 0815  SpO2 96 0815    Last Pain:  Vitals:   08/25/22 0735  TempSrc:   PainSc: 0-No pain         Complications: No notable events documented.

## 2022-08-25 NOTE — H&P (Signed)
Primary Care Physician:  Johnette Abraham, MD Primary Gastroenterologist:  Dr. Abbey Chatters  Pre-Procedure History & Physical: HPI:  Justin Villa is a 57 y.o. male is here for an EGD to be performed for Chronic GERD and colonoscopy for recent diverticulitis.   Past Medical History:  Diagnosis Date   CAD (coronary artery disease) 11/27/2015   a. S/p NSTEMI 6/17 >> s/p CABG // b. LHC 6/17: oLAD 50, mLAD 100, D1 70, D2 95, oLCx 95, mLCx 65, OM1 85, OM2 90, mRCA 85, dRCA 75, RPDA 40/50, RPLB2 50    Carotid stenosis    a. Carotid US 6/17: bilat ICA 1-39%   Essential hypertension    GERD (gastroesophageal reflux disease)    History of non-ST elevation myocardial infarction (NSTEMI) 11/2015   Hyperlipidemia     Past Surgical History:  Procedure Laterality Date   BACK SURGERY     CARDIAC CATHETERIZATION N/A 11/26/2015   Procedure: Left Heart Cath and Coronary Angiography;  Surgeon: Belva Crome, MD;  Location: [facility];  Service: Cardiovascular;  Laterality: N/A;   CORONARY ARTERY BYPASS GRAFT N/A 11/27/2015   Procedure: CORONARY ARTERY BYPASS GRAFTING (CABG) x 5 (LIMA to LAD, SVG to DIAGONAL, SVG SEQUENTIALLY to OM1 and OM2, SVG to PDA) with EVH from right leg using greater saphenous vein and mammary.;  Surgeon: Grace Isaac, MD;  Location: [facility];  Service: Open Heart Surgery;  Laterality: N/A;   INCISION AND DRAINAGE ABSCESS Right 06/16/2020   Procedure: INCISION AND DRAINAGE ABSCESS RIGHT PINKY TOE (5th toe);  Surgeon: Carole Civil, MD;  Location: [facility];  Service: Orthopedics;  Laterality: Right;   INTRAOPERATIVE TRANSESOPHAGEAL ECHOCARDIOGRAM N/A 11/27/2015   Procedure: INTRAOPERATIVE TRANSESOPHAGEAL ECHOCARDIOGRAM;  Surgeon: Grace Isaac, MD;  Location: [facility];  Service: Open Heart Surgery;  Laterality: N/A;   KNEE SURGERY Left    arthroscopy    Prior to Admission medications   Medication Sig Start Date End Date Taking? Authorizing Provider  aspirin EC 81  MG EC tablet Take 1 tablet (81 mg total) by mouth daily. 12/01/15  Yes Tacy Dura, Donielle M, PA-C  atorvastatin (LIPITOR) 20 MG tablet TAKE ONE TABLET BY MOUTH DAILY 10/18/21  Yes Satira Sark, MD  Cholecalciferol (VITAMIN D) 50 MCG (2000 UT) CAPS Take 2,000 Units by mouth daily.   Yes [provider]  Elastic Bandages & Supports (WRIST SPLINT/COCK-UP/LEFT L) MISC 1 each by Does not apply route at bedtime. 06/17/22  Yes Johnette Abraham, MD  gabapentin (NEURONTIN) 100 MG capsule Take 1 capsule (100 mg total) by mouth at bedtime. 07/04/22  Yes Carole Civil, MD  lisinopril (ZESTRIL) 40 MG tablet TAKE ONE TABLET BY MOUTH DAILY 10/18/21  Yes Satira Sark, MD  methylPREDNISolone (MEDROL) 8 MG tablet Take 4 mg by mouth in the morning and at bedtime.   Yes [provider]  metoprolol succinate (TOPROL-XL) 25 MG 24 hr tablet TAKE ONE TABLET BY MOUTH DAILY 10/18/21  Yes Satira Sark, MD  nitroGLYCERIN (NITROSTAT) 0.4 MG SL tablet Place 1 tablet (0.4 mg total) under the tongue every 5 (five) minutes x 3 doses as needed for chest pain (if no relief after 2nd dose, proceed to ED or call 911). 10/06/21  Yes Satira Sark, MD  pantoprazole (PROTONIX) 40 MG tablet Take 1 tablet (40 mg total) by mouth 2 (two) times daily before a meal. 07/05/22  Yes Annitta Needs, NP  Pyridoxine HCl (B-6) 100 MG TABS  Take 100 mg by mouth in the morning and at bedtime. Patient not taking: Reported on 07/07/2022 07/04/22   Carole Civil, MD    Allergies as of 07/05/2022   (No Known Allergies)    Family History  Problem Relation Age of Onset   Heart disease Father     Social History   Socioeconomic History   Marital status: Single    Spouse name: Not on file   Number of children: Not on file   Years of education: Not on file   Highest education level: Not on file  Occupational History   Not on file  Tobacco Use   Smoking status: Never   Smokeless tobacco: Never  Vaping Use    Vaping Use: Never used  Substance and Sexual Activity   Alcohol use: Yes    Alcohol/week: 2.0 standard drinks of alcohol    Types: 2 Shots of liquor per week    Comment: everyother day   Drug use: No   Sexual activity: Yes  Other Topics Concern   Not on file  Social History Narrative   Not on file   Social Determinants of Health   Financial Resource Strain: Not on file  Food Insecurity: Not on file  Transportation Needs: Not on file  Physical Activity: Not on file  Stress: Not on file  Social Connections: Not on file  Intimate Partner Violence: Not on file    Review of Systems: General: Negative for fever, chills, fatigue, weakness. Eyes: Negative for vision changes.  ENT: Negative for hoarseness, difficulty swallowing , nasal congestion. CV: Negative for chest pain, angina, palpitations, dyspnea on exertion, peripheral edema.  Respiratory: Negative for dyspnea at rest, dyspnea on exertion, cough, sputum, wheezing.  GI: See history of present illness. GU:  Negative for dysuria, hematuria, urinary incontinence, urinary frequency, nocturnal urination.  MS: Negative for joint pain, low back pain.  Derm: Negative for rash or itching.  Neuro: Negative for weakness, abnormal sensation, seizure, frequent headaches, memory loss, confusion.  Psych: Negative for anxiety, depression Endo: Negative for unusual weight change.  Heme: Negative for bruising or bleeding. Allergy: Negative for rash or hives.  Physical Exam: Vital signs in last 24 hours: Temp:  [97.6 F (36.4 C)] 97.6 F (36.4 C) (03/21 0621) Pulse Rate:  [68] 68 (03/21 0621) Resp:  [18] 18 (03/21 0621) BP: (123)/(83) 123/83 (03/21 0621) SpO2:  [96 %] 96 % (03/21 0621) Weight:  [104.8 kg] 104.8 kg (03/21 0621)   General:   Alert,  Well-developed, well-nourished, pleasant and cooperative in NAD Head:  Normocephalic and atraumatic. Eyes:  Sclera clear, no icterus.   Conjunctiva pink. Ears:  Normal auditory  acuity. Nose:  No deformity, discharge,  or lesions. Msk:  Symmetrical without gross deformities. Normal posture. Extremities:  Without clubbing or edema. Neurologic:  Alert and  oriented x4;  grossly normal neurologically. Skin:  Intact without significant lesions or rashes. Psych:  Alert and cooperative. Normal mood and affect.   Impression/Plan: Justin Villa is here for an EGD to be performed for Chronic GERD and colonoscopy for recent diverticulitis.   Risks, benefits, limitations, imponderables and alternatives regarding EGD have been reviewed with the patient. Questions have been answered. All parties agreeable.

## 2022-08-25 NOTE — Discharge Instructions (Addendum)
EGD Discharge instructions Please read the instructions outlined below and refer to this sheet in the next few weeks. These discharge instructions provide you with general information on caring for yourself after you leave the hospital. Your doctor may also give you specific instructions. While your treatment has been planned according to the most current medical practices available, unavoidable complications occasionally occur. If you have any problems or questions after discharge, please call your doctor. ACTIVITY You may resume your regular activity but move at a slower pace for the next 24 hours.  Take frequent rest periods for the next 24 hours.  Walking will help expel (get rid of) the air and reduce the bloated feeling in your abdomen.  No driving for 24 hours (because of the anesthesia (medicine) used during the test).  You may shower.  Do not sign any important legal documents or operate any machinery for 24 hours (because of the anesthesia used during the test).  NUTRITION Drink plenty of fluids.  You may resume your normal diet.  Begin with a light meal and progress to your normal diet.  Avoid alcoholic beverages for 24 hours or as instructed by your caregiver.  MEDICATIONS You may resume your normal medications unless your caregiver tells you otherwise.  WHAT YOU CAN EXPECT TODAY You may experience abdominal discomfort such as a feeling of fullness or "gas" pains.  FOLLOW-UP Your doctor will discuss the results of your test with you.  SEEK IMMEDIATE MEDICAL ATTENTION IF ANY OF THE FOLLOWING OCCUR: Excessive nausea (feeling sick to your stomach) and/or vomiting.  Severe abdominal pain and distention (swelling).  Trouble swallowing.  Temperature over 101 F (37.8 C).  Rectal bleeding or vomiting of blood.    Colonoscopy Discharge Instructions  Read the instructions outlined below and refer to this sheet in the next few weeks. These discharge instructions provide you with  general information on caring for yourself after you leave the hospital. Your doctor may also give you specific instructions. While your treatment has been planned according to the most current medical practices available, unavoidable complications occasionally occur.   ACTIVITY You may resume your regular activity, but move at a slower pace for the next 24 hours.  Take frequent rest periods for the next 24 hours.  Walking will help get rid of the air and reduce the bloated feeling in your belly (abdomen).  No driving for 24 hours (because of the medicine (anesthesia) used during the test).   Do not sign any important legal documents or operate any machinery for 24 hours (because of the anesthesia used during the test).  NUTRITION Drink plenty of fluids.  You may resume your normal diet as instructed by your doctor.  Begin with a light meal and progress to your normal diet. Heavy or fried foods are harder to digest and may make you feel sick to your stomach (nauseated).  Avoid alcoholic beverages for 24 hours or as instructed.  MEDICATIONS You may resume your normal medications unless your doctor tells you otherwise.  WHAT YOU CAN EXPECT TODAY Some feelings of bloating in the abdomen.  Passage of more gas than usual.  Spotting of blood in your stool or on the toilet paper.  IF YOU HAD POLYPS REMOVED DURING THE COLONOSCOPY: No aspirin products for 7 days or as instructed.  No alcohol for 7 days or as instructed.  Eat a soft diet for the next 24 hours.  FINDING OUT THE RESULTS OF YOUR TEST Not all test results are available  during your visit. If your test results are not back during the visit, make an appointment with your caregiver to find out the results. Do not assume everything is normal if you have not heard from your caregiver or the medical facility. It is important for you to follow up on all of your test results.  SEEK IMMEDIATE MEDICAL ATTENTION IF: You have more than a spotting of  blood in your stool.  Your belly is swollen (abdominal distention).  You are nauseated or vomiting.  You have a temperature over 101.  You have abdominal pain or discomfort that is severe or gets worse throughout the day.   Your EGD revealed mild amount inflammation in your stomach.  I took biopsies of this to rule out infection with a bacteria called H. pylori.    You also have a medium size hiatal hernia and evidence of Barrett's esophagus.  I took samples of your esophagus as well.  Await pathology results, my office will contact you.  Continue on pantoprazole twice daily.  Your colonoscopy revealed 2 polyp(s) which I removed successfully. Await pathology results, my office will contact you. I recommend repeating colonoscopy in 7 years for surveillance purposes.   You also have diverticulosis and internal hemorrhoids. I would recommend increasing fiber in your diet or adding OTC Benefiber/Metamucil. Be sure to drink at least 4 to 6 glasses of water daily. Follow-up with GI in 3 months   I hope you have a great rest of your week!  Elon Alas. Abbey Chatters, D.O. Gastroenterology and Hepatology [facility]

## 2022-08-25 NOTE — Anesthesia Postprocedure Evaluation (Signed)
Anesthesia Post Note  Patient: Justin Villa  Procedure(s) Performed: COLONOSCOPY WITH PROPOFOL ESOPHAGOGASTRODUODENOSCOPY (EGD) WITH PROPOFOL BIOPSY POLYPECTOMY  Patient location during evaluation: Phase II Anesthesia Type: General Level of consciousness: awake and alert and oriented Pain management: pain level controlled Vital Signs Assessment: post-procedure vital signs reviewed and stable Respiratory status: spontaneous breathing, nonlabored ventilation and respiratory function stable Cardiovascular status: blood pressure returned to baseline and stable Postop Assessment: no apparent nausea or vomiting Anesthetic complications: no  No notable events documented.   Last Vitals:  Vitals:   08/25/22 0621 08/25/22 0813  BP: 123/83 105/76  Pulse: 68 72  Resp: 18 (!) 24  Temp: 36.4 C (!) 36.3 C  SpO2: 96% 96%    Last Pain:  Vitals:   08/25/22 0813  TempSrc: Oral  PainSc: 0-No pain                 Hermione Havlicek C Bobby Barton

## 2022-08-25 NOTE — Anesthesia Preprocedure Evaluation (Signed)
Anesthesia Evaluation  Patient identified by MRN, date of birth, ID band Patient awake    Reviewed: Allergy & Precautions, H&P , NPO status , Patient's Chart, lab work & pertinent test results  Airway Mallampati: II  TM Distance: >3 FB Neck ROM: Full    Dental  (+) Dental Advisory Given, Caps   Pulmonary sleep apnea (snoring?)    Pulmonary exam normal breath sounds clear to auscultation       Cardiovascular Exercise Tolerance: Good hypertension, Pt. on medications + CAD, + Past MI and + CABG  Normal cardiovascular exam Rhythm:Regular Rate:Normal     Neuro/Psych negative neurological ROS  negative psych ROS   GI/Hepatic Neg liver ROS,GERD  Medicated,,  Endo/Other  negative endocrine ROS    Renal/GU negative Renal ROS  negative genitourinary   Musculoskeletal  (+) Arthritis , Osteoarthritis,    Abdominal   Peds negative pediatric ROS (+)  Hematology negative hematology ROS (+)   Anesthesia Other Findings ARTHRITIS, WRIST CAD (coronary artery disease) Carotid stenosis Encounter for general adult medical examination with abnormal findings GERD (gastroesophageal reflux disease) HLD (hyperlipidemia) HTN (hypertension) History of diverticulitis NSTEMI (non-ST elevated myocardial infarction) (HCC) Numbness and tingling in left hand Pain in right foot Risk factors for obstructive sleep apnea S/P foot surgery, right I and D 06/16/20 Septic arthritis of right foot (HCC)    Reproductive/Obstetrics negative OB ROS                             Anesthesia Physical Anesthesia Plan  ASA: 3  Anesthesia Plan: General   Post-op Pain Management: Minimal or no pain anticipated   Induction: Intravenous  PONV Risk Score and Plan: 1 and Propofol infusion  Airway Management Planned: Nasal Cannula and Natural Airway  Additional Equipment:   Intra-op Plan:   Post-operative Plan:   Informed  Consent: I have reviewed the patients History and Physical, chart, labs and discussed the procedure including the risks, benefits and alternatives for the proposed anesthesia with the patient or authorized representative who has indicated his/her understanding and acceptance.     Dental advisory given  Plan Discussed with: CRNA and Surgeon  Anesthesia Plan Comments:         Anesthesia Quick Evaluation

## 2022-08-25 NOTE — Op Note (Signed)
[facility] Patient Name: Justin Villa Procedure Date: 08/25/2022 7:20 AM MRN: [id number] Date of Birth: 1966/01/26 Attending MD: Elon Alas. Abbey Chatters , [place], [id number] CSN: [id number] Age: 57 Admit Type: Outpatient Procedure:                Colonoscopy Indications:              Follow-up of diverticulitis Providers:                Elon Alas. Abbey Chatters, DO, Caprice Kluver, Raphael Gibney,                            Technician Referring MD:              Medicines:                See the Anesthesia note for documentation of the                            administered medications Complications:            No immediate complications. Estimated Blood Loss:     Estimated blood loss was minimal. Procedure:                Pre-Anesthesia Assessment:                           - The anesthesia plan was to use monitored                            anesthesia care (MAC).                           After obtaining informed consent, the colonoscope                            was passed under direct vision. Throughout the                            procedure, the patient's blood pressure, pulse, and                            oxygen saturations were monitored continuously. The                            [id number] scope was introduced through                            the anus and advanced to the the cecum, identified                            by appendiceal orifice and ileocecal valve. The                            colonoscopy was performed without difficulty. The                            patient tolerated the procedure well. The quality  of the bowel preparation was evaluated using the                            BBPS [facility] Bowel Preparation Scale) with scores                            of: Right Colon = 3, Transverse Colon = 3 and Left                            Colon = 3 (entire mucosa seen well with no residual                            staining, small  fragments of stool or opaque                            liquid). The total BBPS score equals 9. Scope In: 7:51:28 AM Scope Out: 8:08:14 AM Scope Withdrawal Time: 0 hours 14 minutes 7 seconds  Total Procedure Duration: 0 hours 16 minutes 46 seconds  Findings:      Non-bleeding internal hemorrhoids were found during endoscopy.      Multiple large-mouthed and small-mouthed diverticula were found in the       sigmoid colon and descending colon.      A 2 mm polyp was found in the cecum. The polyp was sessile. The polyp       was removed with a cold biopsy forceps. Resection and retrieval were       complete.      A 3 mm polyp was found in the sigmoid colon. The polyp was pedunculated.       The polyp was removed with a cold snare. Resection and retrieval were       complete. Impression:               - Non-bleeding internal hemorrhoids.                           - Diverticulosis in the sigmoid colon and in the                            descending colon.                           - One 2 mm polyp in the cecum, removed with a cold                            biopsy forceps. Resected and retrieved.                           - One 3 mm polyp in the sigmoid colon, removed with                            a cold snare. Resected and retrieved. Moderate Sedation:      Per Anesthesia Care Recommendation:           - Patient has a contact number available for  emergencies. The signs and symptoms of potential                            delayed complications were discussed with the                            patient. Return to normal activities tomorrow.                            Written discharge instructions were provided to the                            patient.                           - Resume previous diet.                           - Continue present medications.                           - Await pathology results.                           - Repeat colonoscopy in  7 years for surveillance.                           - Return to GI clinic in 3 months. Procedure Code(s):        --- Professional ---                           [phone], Colonoscopy, flexible; with removal of                            tumor(s), polyp(s), or other lesion(s) by snare                            technique                           45380, 51, Colonoscopy, flexible; with biopsy,                            single or multiple Diagnosis Code(s):        --- Professional ---                           K64.8, Other hemorrhoids                           D12.0, Benign neoplasm of cecum                           D12.5, Benign neoplasm of sigmoid colon                           K57.32, Diverticulitis of large intestine without  perforation or abscess without bleeding                           K57.30, Diverticulosis of large intestine without                            perforation or abscess without bleeding CPT copyright 2022 American Medical Association. All rights reserved. The codes documented in this report are preliminary and upon coder review may  be revised to meet current compliance requirements. Elon Alas. Abbey Chatters, DO [place] Abbey Chatters, DO 08/25/2022 8:09:04 AM This report has been signed electronically. Number of Addenda: 0

## 2022-08-25 NOTE — Op Note (Signed)
[facility] Patient Name: Justin Villa Procedure Date: 08/25/2022 7:21 AM MRN: [id number] Date of Birth: 1966/05/17 Attending MD: Elon Alas. Abbey Chatters , [place], [id number] CSN: [id number] Age: 57 Admit Type: Outpatient Procedure:                Upper GI endoscopy Indications:              Heartburn Providers:                Elon Alas. Abbey Chatters, DO, Caprice Kluver, Raphael Gibney,                            Technician Referring MD:              Medicines:                See the Anesthesia note for documentation of the                            administered medications Complications:            No immediate complications. Estimated Blood Loss:     Estimated blood loss was minimal. Procedure:                Pre-Anesthesia Assessment:                           - The anesthesia plan was to use monitored                            anesthesia care (MAC).                           After obtaining informed consent, the endoscope was                            passed under direct vision. Throughout the                            procedure, the patient's blood pressure, pulse, and                            oxygen saturations were monitored continuously. The                            [id number]) scope was introduced through the                            mouth, and advanced to the second part of duodenum.                            The upper GI endoscopy was accomplished without                            difficulty. The patient tolerated the procedure                            well. Scope In: 7:39:35 AM Scope Out:  7:45:53 AM Total Procedure Duration: 0 hours 6 minutes 18 seconds  Findings:      A 4 cm hiatal hernia was present.      There were esophageal mucosal changes consistent with short-segment       Barrett's esophagus present in the lower third of the esophagus. The       maximum longitudinal extent of these mucosal changes was 3 cm in length.       Mucosa was biopsied with a cold  forceps for histology. One specimen       bottle was sent to pathology.      Patchy mild inflammation characterized by erythema was found in the       gastric body and in the gastric antrum. Biopsies were taken with a cold       forceps for Helicobacter pylori testing.      The duodenal bulb, first portion of the duodenum and second portion of       the duodenum were normal. Impression:               - 4 cm hiatal hernia.                           - Esophageal mucosal changes consistent with                            short-segment Barrett's esophagus. Biopsied.                           - Gastritis. Biopsied.                           - Normal duodenal bulb, first portion of the                            duodenum and second portion of the duodenum. Moderate Sedation:      Per Anesthesia Care Recommendation:           - Patient has a contact number available for                            emergencies. The signs and symptoms of potential                            delayed complications were discussed with the                            patient. Return to normal activities tomorrow.                            Written discharge instructions were provided to the                            patient.                           - Resume previous diet.                           -  Continue present medications.                           - Await pathology results.                           - Repeat upper endoscopy in 3-5 years for                            surveillance.                           - Return to GI clinic in 3 months. Procedure Code(s):        --- Professional ---                           [phone], Esophagogastroduodenoscopy, flexible,                            transoral; with biopsy, single or multiple Diagnosis Code(s):        --- Professional ---                           K44.9, Diaphragmatic hernia without obstruction or                            gangrene                            K22.89, Other specified disease of esophagus                           K29.70, Gastritis, unspecified, without bleeding                           R12, Heartburn CPT copyright 2022 American Medical Association. All rights reserved. The codes documented in this report are preliminary and upon coder review may  be revised to meet current compliance requirements. Elon Alas. Abbey Chatters, DO [place] Abbey Chatters, DO 08/25/2022 7:48:50 AM This report has been signed electronically. Number of Addenda: 0

## 2022-08-26 LAB — SURGICAL PATHOLOGY

## 2022-09-06 ENCOUNTER — Encounter (HOSPITAL_COMMUNITY): Payer: Self-pay | Admitting: Internal Medicine

## 2022-09-22 ENCOUNTER — Encounter: Payer: Self-pay | Admitting: Gastroenterology

## 2022-09-23 ENCOUNTER — Ambulatory Visit: Payer: 59 | Admitting: Internal Medicine

## 2022-09-26 ENCOUNTER — Ambulatory Visit: Payer: 59 | Admitting: Orthopedic Surgery

## 2022-09-27 ENCOUNTER — Ambulatory Visit: Payer: 59 | Admitting: Internal Medicine

## 2022-10-03 ENCOUNTER — Encounter: Payer: Self-pay | Admitting: Orthopedic Surgery

## 2022-10-03 ENCOUNTER — Ambulatory Visit: Payer: 59 | Admitting: Orthopedic Surgery

## 2022-10-03 DIAGNOSIS — M25461 Effusion, right knee: Secondary | ICD-10-CM | POA: Diagnosis not present

## 2022-10-03 DIAGNOSIS — G5602 Carpal tunnel syndrome, left upper limb: Secondary | ICD-10-CM

## 2022-10-03 DIAGNOSIS — Z01818 Encounter for other preprocedural examination: Secondary | ICD-10-CM

## 2022-10-03 NOTE — Progress Notes (Signed)
Chief Complaint  Patient presents with   Hand Pain    LT hand//follow up Had NCS w/ Dr.Newton 07/12/22    Status post nerve conduction study patient with symptoms of carpal tunnel numbness tingling difficulty picking up small objects  Nerve conduction he said that he has severe left median neuropathy and he is wishing to have carpal tunnel release symptoms less than 1 year at approximately 7 months time  Also has had a right knee reconstruction ACL.  He went to [employer] did a lot of walking and then the knee started swelling is having trouble bending it  Has a large effusion.  There is a midline incision.  Plan is to do an aspiration no injection  Patient will be scheduled for left carpal tunnel release  Patient on Plavix  Right knee aspiration  Patient consented for right knee aspiration  Procedure note  Timeout was used to confirm aspiration right knee  Alcohol was used to clean the knee ethyl chloride was used to anesthetize the skin a lateral approach suprapatellar was used to aspirate about 30 cc of clear yellow fluid  No injection of steroid was done  No complications were noted

## 2022-10-03 NOTE — Patient Instructions (Signed)
Your surgery will be at  by Dr Harrison  The hospital will contact you with a preoperative appointment to discuss Anesthesia.  Please arrive on time or 15 minutes early for the preoperative appointment, they have a very tight schedule if you are late or do not come in your surgery will be cancelled.  The phone number is [phone]. Please bring your medications with you for the appointment. They will tell you the arrival time and medication instructions when you have your preoperative evaluation. Do not wear nail polish the day of your surgery and if you take Phentermine you need to stop this medication ONE WEEK prior to your surgery. If you take Invokana, Farxiga, Jardiance, or Steglatro) - Hold 72 hours before the procedure.  If you take Ozempic,  Bydureon or Trulicity do not take for 8 days before your surgery. If you take Victoza, Rybelsis, Saxenda or Adlyxi stop 24 hours before the procedure.  Please arrive at the hospital 2 hours before procedure if scheduled at 9:30 or later in the day or at the time the nurse tells you at your preoperative visit.   If you have my chart do not use the time given in my chart use the time given to you by the nurse during your preoperative visit.   Your surgery  time may change. Please be available for phone calls the day of your surgery and the day before. The Short Stay department may need to discuss changes about your surgery time. Not reaching the you could lead to procedure delays and possible cancellation.  You must have a ride home and someone to stay with you for 24 to 48 hours. The person taking you home will receive and sign for the your discharge instructions.  Please be prepared to give your support person's name and telephone number to Central Registration. Dr Harrison will need that name and phone number post procedure.   

## 2022-10-03 NOTE — Addendum Note (Signed)
Addended byCaffie Damme on: 10/03/2022 11:22 AM   Modules accepted: Orders

## 2022-10-03 NOTE — Addendum Note (Signed)
Addended byCaffie Damme on: 10/03/2022 11:24 AM   Modules accepted: Orders

## 2022-10-05 ENCOUNTER — Telehealth: Payer: Self-pay | Admitting: Orthopedic Surgery

## 2022-10-05 NOTE — Telephone Encounter (Signed)
I will call him offer him 10/19/22

## 2022-10-05 NOTE — Pre-Procedure Instructions (Signed)
  RE: plavix Received: Today Littrell, Amy W, RT  Elsie Amis, RN Dr Romeo Apple advised him to hold it before surgery told him 48 hours       Previous Messages    ----- Message ----- From: Elsie Amis, RN Sent: 10/05/2022   8:24 AM EDT To: Caffie Damme, RT Subject: plavix                                        Hey Amy! I didn't see anything in the notes about Jaishaun Mcnab holding his plavix before his procedure. Would you check that and let me know please? Thank you!

## 2022-10-05 NOTE — Telephone Encounter (Signed)
He is good with May 15th, I sent message to [facility]

## 2022-10-05 NOTE — Patient Instructions (Signed)
[facility]  10/05/2022     @[email or web address] @   Your procedure is scheduled on 10/11/2022.   Report to [facility] at  0700  A.M.   Call this number if you have problems the morning of surgery:  [phone]  If you experience any cold or flu symptoms such as cough, fever, chills, shortness of breath, etc. between now and your scheduled surgery, please notify us at the above number.   Remember:  Do not eat or drink after midnight.      Take these medicines the morning of surgery with A SIP OF WATER                                        metoprolol.    Do not wear jewelry, make-up or nail polish.  Do not wear lotions, powders, or perfumes, or deodorant.  Do not shave 48 hours prior to surgery.  Men may shave face and neck.  Do not bring valuables to the hospital.  [facility] is not responsible for any belongings or valuables.  Contacts, dentures or bridgework may not be worn into surgery.  Leave your suitcase in the car.  After surgery it may be brought to your room.  For patients admitted to the hospital, discharge time will be determined by your treatment team.  Patients discharged the day of surgery will not be allowed to drive home and must have someone with them for 24 hours.    Special instructions:   DO NOT smoke tobacco or vape for 24 hours before your procedure.  Please read over the following fact sheets that you were given. Anesthesia Post-op Instructions and Care and Recovery After Surgery      Open Carpal Tunnel Release, Care After This sheet gives you information about how to care for yourself after your procedure. Your health care provider may also give you more specific instructions. If you have problems or questions, contact your health care provider. What can I expect after the procedure? After the procedure, it is common to have: Pain. Swelling. Wrist stiffness. Bruising. Follow these instructions at home: Medicines Take  over-the-counter and prescription medicines only as told by your health care provider. Ask your health care provider if the medicine prescribed to you: Requires you to avoid driving or using machinery. Can cause constipation. You may need to take these actions to prevent or treat constipation: Drink enough fluid to keep your urine pale yellow. Take over-the-counter or prescription medicines. Eat foods that are high in fiber, such as beans, whole grains, and fresh fruits and vegetables. Limit foods that are high in fat and processed sugars, such as fried or sweet foods. Bathing Do not take baths, swim, or use a hot tub until your health care provider approves. Ask your health care provider if you may take showers. Keep your bandage (dressing) dry until your health care provider says it can be removed. Cover it with a watertight covering when you take a bath or a shower. If you have a splint or brace: Wear the splint or brace as told by your health care provider. You may need to wear it for 2-3 weeks. Remove it only as told by your health care provider. Loosen the splint or brace if your fingers tingle, become numb, or turn cold and blue. Keep the splint or brace clean.  If the splint or brace is not waterproof: Do not let it get wet. Cover it with a watertight covering when you take a bath or a shower. Incision care  After the compression bandage has been removed, follow instructions from your health care provider about how to take care of your incision. Make sure you: Wash your hands with soap and water for at least 20 seconds before and after you change your bandage (dressing). If soap and water are not available, use hand sanitizer. Change your dressing as told by your health care provider. Leave stitches (sutures), skin glue, or adhesive strips in place. These skin closures may need to stay in place for 2 weeks or longer. If adhesive strip edges start to loosen and curl up, you may trim the  loose edges. Do not remove adhesive strips completely unless your health care provider tells you to do that. Check your incision area every day for signs of infection. Check for: Redness. More swelling or pain. Fluid or blood. Warmth. Pus or a bad smell. Managing pain, stiffness, and swelling  If directed, put ice on the affected area. If you have a removable splint or brace, remove it as told by your health care provider. Put ice in a plastic bag. Place a towel between your skin and the bag. Leave the ice on for 20 minutes, 2-3 times a day. Do not fall asleep with ice pack on your skin. Remove the ice if your skin turns bright red. This is very important. If you cannot feel pain, heat, or cold, you have a greater risk of damage to the area. Move your fingers often to avoid stiffness and to lessen swelling. Raise (elevate) your wrist above the level of your heart while you are sitting or lying down. Activity Do not drive until your health care provider approves. Use your hand carefully. Do not do activities that cause pain. You should be able to do light activities with your hand. Do not lift with your affected hand until your health care provider approves. Avoid pulling and pushing with the injured arm. Return to your normal activities as told by your health care provider. Ask your health care provider what activities are safe for you. If physical therapy was prescribed, do exercises as told by your therapist. Physical therapy can help you heal faster and regain movement. General instructions Do not use any products that contain nicotine or tobacco, such as cigarettes and e-cigarettes. These can delay incision healing after surgery. If you need help quitting, ask your health care provider. Keep all follow-up visits. This is important. These include visits for physical therapy. Contact a health care provider if: You have redness around your incision. You have more swelling or pain. You  have fluid or blood coming from your incision. Your incision feels warm to the touch. You have pus or a bad smell coming from your incision. You have a fever or chills. You have pain that does not get better with medicine. Your carpal tunnel symptoms do not go away after 2 months. Your carpal tunnel symptoms go away and then come back. Get help right away if: You have pain or numbness that is getting worse. Your fingers or fingertips become very pale or bluish in color. You are not able to move your fingers. Summary It is common to have wrist stiffness and bruising after a carpal tunnel release. Icing and raising (elevating) your wrist may help to lessen swelling and pain. Call your health care provider if  you have a fever or notice any signs of infection in your incision area. This information is not intended to replace advice given to you by your health care provider. Make sure you discuss any questions you have with your health care provider. Document Revised: 09/26/2019 Document Reviewed: 09/26/2019 Elsevier Patient Education  2023 [employer] General Anesthesia, Adult, Care After The following information offers guidance on how to care for yourself after your procedure. Your health care provider may also give you more specific instructions. If you have problems or questions, contact your health care provider. What can I expect after the procedure? After the procedure, it is common for people to: Have pain or discomfort at the IV site. Have nausea or vomiting. Have a sore throat or hoarseness. Have trouble concentrating. Feel cold or chills. Feel weak, sleepy, or tired (fatigue). Have soreness and body aches. These can affect parts of the body that were not involved in surgery. Follow these instructions at home: For the time period you were told by your health care provider:  Rest. Do not participate in activities where you could fall or become injured. Do not drive or use  machinery. Do not drink alcohol. Do not take sleeping pills or medicines that cause drowsiness. Do not make important decisions or sign legal documents. Do not take care of children on your own. General instructions Drink enough fluid to keep your urine pale yellow. If you have sleep apnea, surgery and certain medicines can increase your risk for breathing problems. Follow instructions from your health care provider about wearing your sleep device: Anytime you are sleeping, including during daytime naps. While taking prescription pain medicines, sleeping medicines, or medicines that make you drowsy. Return to your normal activities as told by your health care provider. Ask your health care provider what activities are safe for you. Take over-the-counter and prescription medicines only as told by your health care provider. Do not use any products that contain nicotine or tobacco. These products include cigarettes, chewing tobacco, and vaping devices, such as e-cigarettes. These can delay incision healing after surgery. If you need help quitting, ask your health care provider. Contact a health care provider if: You have nausea or vomiting that does not get better with medicine. You vomit every time you eat or drink. You have pain that does not get better with medicine. You cannot urinate or have bloody urine. You develop a skin rash. You have a fever. Get help right away if: You have trouble breathing. You have chest pain. You vomit blood. These symptoms may be an emergency. Get help right away. Call 911. Do not wait to see if the symptoms will go away. Do not drive yourself to the hospital. Summary After the procedure, it is common to have a sore throat, hoarseness, nausea, vomiting, or to feel weak, sleepy, or fatigue. For the time period you were told by your health care provider, do not drive or use machinery. Get help right away if you have difficulty breathing, have chest pain, or  vomit blood. These symptoms may be an emergency. This information is not intended to replace advice given to you by your health care provider. Make sure you discuss any questions you have with your health care provider. Document Revised: 08/20/2021 Document Reviewed: 08/20/2021 Elsevier Patient Education  2023 [employer] How to Use Chlorhexidine Before Surgery Chlorhexidine gluconate (CHG) is a germ-killing (antiseptic) solution that is used to clean the skin. It can get rid of the bacteria that normally live  on the skin and can keep them away for about 24 hours. To clean your skin with CHG, you may be given: A CHG solution to use in the shower or as part of a sponge bath. A prepackaged cloth that contains CHG. Cleaning your skin with CHG may help lower the risk for infection: While you are staying in the intensive care unit of the hospital. If you have a vascular access, such as a central line, to provide short-term or long-term access to your veins. If you have a catheter to drain urine from your bladder. If you are on a ventilator. A ventilator is a machine that helps you breathe by moving air in and out of your lungs. After surgery. What are the risks? Risks of using CHG include: A skin reaction. Hearing loss, if CHG gets in your ears and you have a perforated eardrum. Eye injury, if CHG gets in your eyes and is not rinsed out. The CHG product catching fire. Make sure that you avoid smoking and flames after applying CHG to your skin. Do not use CHG: If you have a chlorhexidine allergy or have previously reacted to chlorhexidine. On babies younger than 15 months of age. How to use CHG solution Use CHG only as told by your health care provider, and follow the instructions on the label. Use the full amount of CHG as directed. Usually, this is one bottle. During a shower Follow these steps when using CHG solution during a shower (unless your health care provider gives you different  instructions): Start the shower. Use your normal soap and shampoo to wash your face and hair. Turn off the shower or move out of the shower stream. Pour the CHG onto a clean washcloth. Do not use any type of brush or rough-edged sponge. Starting at your neck, lather your body down to your toes. Make sure you follow these instructions: If you will be having surgery, pay special attention to the part of your body where you will be having surgery. Scrub this area for at least 1 minute. Do not use CHG on your head or face. If the solution gets into your ears or eyes, rinse them well with water. Avoid your genital area. Avoid any areas of skin that have broken skin, cuts, or scrapes. Scrub your back and under your arms. Make sure to wash skin folds. Let the lather sit on your skin for 1-2 minutes or as long as told by your health care provider. Thoroughly rinse your entire body in the shower. Make sure that all body creases and crevices are rinsed well. Dry off with a clean towel. Do not put any substances on your body afterward--such as powder, lotion, or perfume--unless you are told to do so by your health care provider. Only use lotions that are recommended by the manufacturer. Put on clean clothes or pajamas. If it is the night before your surgery, sleep in clean sheets.  During a sponge bath Follow these steps when using CHG solution during a sponge bath (unless your health care provider gives you different instructions): Use your normal soap and shampoo to wash your face and hair. Pour the CHG onto a clean washcloth. Starting at your neck, lather your body down to your toes. Make sure you follow these instructions: If you will be having surgery, pay special attention to the part of your body where you will be having surgery. Scrub this area for at least 1 minute. Do not use CHG on your head or  face. If the solution gets into your ears or eyes, rinse them well with water. Avoid your genital  area. Avoid any areas of skin that have broken skin, cuts, or scrapes. Scrub your back and under your arms. Make sure to wash skin folds. Let the lather sit on your skin for 1-2 minutes or as long as told by your health care provider. Using a different clean, wet washcloth, thoroughly rinse your entire body. Make sure that all body creases and crevices are rinsed well. Dry off with a clean towel. Do not put any substances on your body afterward--such as powder, lotion, or perfume--unless you are told to do so by your health care provider. Only use lotions that are recommended by the manufacturer. Put on clean clothes or pajamas. If it is the night before your surgery, sleep in clean sheets. How to use CHG prepackaged cloths Only use CHG cloths as told by your health care provider, and follow the instructions on the label. Use the CHG cloth on clean, dry skin. Do not use the CHG cloth on your head or face unless your health care provider tells you to. When washing with the CHG cloth: Avoid your genital area. Avoid any areas of skin that have broken skin, cuts, or scrapes. Before surgery Follow these steps when using a CHG cloth to clean before surgery (unless your health care provider gives you different instructions): Using the CHG cloth, vigorously scrub the part of your body where you will be having surgery. Scrub using a back-and-forth motion for 3 minutes. The area on your body should be completely wet with CHG when you are done scrubbing. Do not rinse. Discard the cloth and let the area air-dry. Do not put any substances on the area afterward, such as powder, lotion, or perfume. Put on clean clothes or pajamas. If it is the night before your surgery, sleep in clean sheets.  For general bathing Follow these steps when using CHG cloths for general bathing (unless your health care provider gives you different instructions). Use a separate CHG cloth for each area of your body. Make sure you  wash between any folds of skin and between your fingers and toes. Wash your body in the following order, switching to a new cloth after each step: The front of your neck, shoulders, and chest. Both of your arms, under your arms, and your hands. Your stomach and groin area, avoiding the genitals. Your right leg and foot. Your left leg and foot. The back of your neck, your back, and your buttocks. Do not rinse. Discard the cloth and let the area air-dry. Do not put any substances on your body afterward--such as powder, lotion, or perfume--unless you are told to do so by your health care provider. Only use lotions that are recommended by the manufacturer. Put on clean clothes or pajamas. Contact a health care provider if: Your skin gets irritated after scrubbing. You have questions about using your solution or cloth. You swallow any chlorhexidine. Call your local poison control center (5798666620 in the [place] Get help right away if: Your eyes itch badly, or they become very red or swollen. Your skin itches badly and is red or swollen. Your hearing changes. You have trouble seeing. You have swelling or tingling in your mouth or throat. You have trouble breathing. These symptoms may represent a serious problem that is an emergency. Do not wait to see if the symptoms will go away. Get medical help right away. Call your local emergency  services (911 in the [place] Do not drive yourself to the hospital. Summary Chlorhexidine gluconate (CHG) is a germ-killing (antiseptic) solution that is used to clean the skin. Cleaning your skin with CHG may help to lower your risk for infection. You may be given CHG to use for bathing. It may be in a bottle or in a prepackaged cloth to use on your skin. Carefully follow your health care provider's instructions and the instructions on the product label. Do not use CHG if you have a chlorhexidine allergy. Contact your health care provider if your skin gets  irritated after scrubbing. This information is not intended to replace advice given to you by your health care provider. Make sure you discuss any questions you have with your health care provider. Document Revised: 09/20/2021 Document Reviewed: 08/03/2020 Elsevier Patient Education  2023 [employer].

## 2022-10-05 NOTE — Telephone Encounter (Signed)
Dr. Mort Sawyers pt - spoke w/the patient, he is requesting that his surgery date be moved to 5/14 because he has some things he needs to get done before he starts cutting on his hand.

## 2022-10-06 ENCOUNTER — Encounter (HOSPITAL_COMMUNITY): Payer: Self-pay

## 2022-10-06 ENCOUNTER — Encounter (HOSPITAL_COMMUNITY)
Admission: RE | Admit: 2022-10-06 | Discharge: 2022-10-06 | Disposition: A | Discharge status: Home / Self Care | Payer: 59 | Source: Ambulatory Visit | Attending: Orthopedic Surgery | Admitting: Orthopedic Surgery

## 2022-10-06 DIAGNOSIS — I10 Essential (primary) hypertension: Secondary | ICD-10-CM

## 2022-10-07 ENCOUNTER — Encounter: Payer: Self-pay | Admitting: Internal Medicine

## 2022-10-07 ENCOUNTER — Ambulatory Visit (INDEPENDENT_AMBULATORY_CARE_PROVIDER_SITE_OTHER): Payer: 59 | Admitting: Internal Medicine

## 2022-10-07 VITALS — BP 106/70 | HR 65 | Ht 72.0 in | Wt 241.0 lb

## 2022-10-07 DIAGNOSIS — R7303 Prediabetes: Secondary | ICD-10-CM

## 2022-10-07 DIAGNOSIS — L989 Disorder of the skin and subcutaneous tissue, unspecified: Secondary | ICD-10-CM | POA: Diagnosis not present

## 2022-10-07 DIAGNOSIS — K2270 Barrett's esophagus without dysplasia: Secondary | ICD-10-CM | POA: Diagnosis not present

## 2022-10-07 DIAGNOSIS — Z23 Encounter for immunization: Secondary | ICD-10-CM | POA: Diagnosis not present

## 2022-10-07 DIAGNOSIS — R200 Anesthesia of skin: Secondary | ICD-10-CM

## 2022-10-07 DIAGNOSIS — R400 Somnolence: Secondary | ICD-10-CM | POA: Diagnosis not present

## 2022-10-07 DIAGNOSIS — Z9189 Other specified personal risk factors, not elsewhere classified: Secondary | ICD-10-CM | POA: Diagnosis not present

## 2022-10-07 DIAGNOSIS — R202 Paresthesia of skin: Secondary | ICD-10-CM | POA: Diagnosis not present

## 2022-10-07 DIAGNOSIS — E782 Mixed hyperlipidemia: Secondary | ICD-10-CM

## 2022-10-07 DIAGNOSIS — E559 Vitamin D deficiency, unspecified: Secondary | ICD-10-CM | POA: Diagnosis not present

## 2022-10-07 NOTE — Progress Notes (Signed)
Established Patient Office Visit  Subjective   Patient ID: Justin Villa, male    DOB: 10/24/1965  Age: 57 y.o. MRN: [id number]  Chief Complaint  Patient presents with   Hyperlipidemia    Follow up   Hypertension    Follow up   Justin Villa returns to care today for follow-up.  He was last evaluated by me on 1/22 as a new patient presenting to establish care.  At that time he endorsed numbness in the first 3 digits of his left hand and an inability to pick up small objects.  His symptoms seemed most consistent with carpal tunnel syndrome.  He was referred to orthopedic surgery for further evaluation and management.  He will undergo carpal tunnel release surgery on 5/15.  In the interim he has also been seen by gastroenterology and underwent EGD/colonoscopy on 3/21.  EGD revealed findings consistent with Barrett's esophagus.  Multiple polyps were removed during colonoscopy and were subsequently consistent with tubular adenomas.  Repeat colonoscopy is recommended for 7 years.  There have otherwise been no acute interval events.  Justin Villa reports feeling well today.  He continues to endorse chronic fatigue.  Previously ordered home sleep study was not completed.  There is a lesion under his left eye that he would like to have evaluated by dermatology and requests a referral today.   Past Medical History:  Diagnosis Date   CAD (coronary artery disease) 11/27/2015   a. S/p NSTEMI 6/17 >> s/p CABG // b. LHC 6/17: oLAD 50, mLAD 100, D1 70, D2 95, oLCx 95, mLCx 65, OM1 85, OM2 90, mRCA 85, dRCA 75, RPDA 40/50, RPLB2 50    Carotid stenosis    a. Carotid US 6/17: bilat ICA 1-39%   Essential hypertension    GERD (gastroesophageal reflux disease)    History of non-ST elevation myocardial infarction (NSTEMI) 11/2015   Hyperlipidemia    Past Surgical History:  Procedure Laterality Date   BACK SURGERY     BIOPSY  08/25/2022   Procedure: BIOPSY;  Surgeon: Lanelle Bal, DO;  Location: [facility];  Service: Endoscopy;;   CARDIAC CATHETERIZATION N/A 11/26/2015   Procedure: Left Heart Cath and Coronary Angiography;  Surgeon: Lyn Records, MD;  Location: [facility] INVASIVE CV LAB;  Service: Cardiovascular;  Laterality: N/A;   COLONOSCOPY WITH PROPOFOL N/A 08/25/2022   Procedure: COLONOSCOPY WITH PROPOFOL;  Surgeon: Lanelle Bal, DO;  Location: [facility];  Service: Endoscopy;  Laterality: N/A;  10:30am, asa 3   CORONARY ARTERY BYPASS GRAFT N/A 11/27/2015   Procedure: CORONARY ARTERY BYPASS GRAFTING (CABG) x 5 (LIMA to LAD, SVG to DIAGONAL, SVG SEQUENTIALLY to OM1 and OM2, SVG to PDA) with EVH from right leg using greater saphenous vein and mammary.;  Surgeon: Delight Ovens, MD;  Location: [facility] OR;  Service: Open Heart Surgery;  Laterality: N/A;   ESOPHAGOGASTRODUODENOSCOPY (EGD) WITH PROPOFOL N/A 08/25/2022   Procedure: ESOPHAGOGASTRODUODENOSCOPY (EGD) WITH PROPOFOL;  Surgeon: Lanelle Bal, DO;  Location: [facility];  Service: Endoscopy;  Laterality: N/A;   INCISION AND DRAINAGE ABSCESS Right 06/16/2020   Procedure: INCISION AND DRAINAGE ABSCESS RIGHT PINKY TOE (5th toe);  Surgeon: Vickki Hearing, MD;  Location: [facility] ORS;  Service: Orthopedics;  Laterality: Right;   INTRAOPERATIVE TRANSESOPHAGEAL ECHOCARDIOGRAM N/A 11/27/2015   Procedure: INTRAOPERATIVE TRANSESOPHAGEAL ECHOCARDIOGRAM;  Surgeon: Delight Ovens, MD;  Location: [facility] OR;  Service: Open Heart Surgery;  Laterality: N/A;   KNEE SURGERY Left  arthroscopy   POLYPECTOMY  08/25/2022   Procedure: POLYPECTOMY;  Surgeon: Lanelle Bal, DO;  Location: [facility];  Service: Endoscopy;;   Social History   Tobacco Use   Smoking status: Never   Smokeless tobacco: Never  Vaping Use   Vaping Use: Never used  Substance Use Topics   Alcohol use: Yes    Alcohol/week: 2.0 standard drinks of alcohol    Types: 2 Shots of liquor per week    Comment: everyother day   Drug use: No   Family History  Problem  Relation Age of Onset   Heart disease Father    No Known Allergies  Review of Systems  Constitutional:  Positive for malaise/fatigue. Negative for chills and fever.  HENT:  Negative for sore throat.   Respiratory:  Negative for cough and shortness of breath.   Cardiovascular:  Negative for chest pain, palpitations and leg swelling.  Gastrointestinal:  Negative for abdominal pain, blood in stool, constipation, diarrhea, nausea and vomiting.  Genitourinary:  Negative for dysuria and hematuria.  Musculoskeletal:  Negative for myalgias.  Skin:  Negative for itching and rash.  Neurological:  Negative for dizziness and headaches.  Psychiatric/Behavioral:  Negative for depression and suicidal ideas.       Objective:     BP 106/70   Pulse 65   Ht 6' (1.829 m)   Wt 241 lb (109.3 kg)   SpO2 97%   BMI 32.69 kg/m  BP Readings from Last 3 Encounters:  10/07/22 106/70  08/25/22 105/76  07/05/22 129/80   Physical Exam Vitals reviewed.  Constitutional:      General: He is not in acute distress.    Appearance: Normal appearance. He is obese. He is not ill-appearing.  HENT:     Head: Normocephalic and atraumatic.     Right Ear: External ear normal.     Left Ear: External ear normal.     Nose: Nose normal. No congestion or rhinorrhea.     Mouth/Throat:     Mouth: Mucous membranes are moist.     Pharynx: Oropharynx is clear.  Eyes:     General: No scleral icterus.    Extraocular Movements: Extraocular movements intact.     Conjunctiva/sclera: Conjunctivae normal.     Pupils: Pupils are equal, round, and reactive to light.  Cardiovascular:     Rate and Rhythm: Normal rate and regular rhythm.     Pulses: Normal pulses.     Heart sounds: Normal heart sounds. No murmur heard. Pulmonary:     Effort: Pulmonary effort is normal.     Breath sounds: Normal breath sounds. No wheezing, rhonchi or rales.  Abdominal:     General: Abdomen is flat. Bowel sounds are normal. There is no  distension.     Palpations: Abdomen is soft.     Tenderness: There is no abdominal tenderness.  Musculoskeletal:        General: No swelling or deformity. Normal range of motion.     Cervical back: Normal range of motion.  Skin:    General: Skin is warm and dry.     Capillary Refill: Capillary refill takes less than 2 seconds.     Findings: Lesion (dark, discolored lesion present under left eyelid) present.  Neurological:     General: No focal deficit present.     Mental Status: He is alert and oriented to person, place, and time.     Motor: No weakness.  Psychiatric:  Mood and Affect: Mood normal.        Behavior: Behavior normal.        Thought Content: Thought content normal.   Last CBC Lab Results  Component Value Date   WBC 9.1 06/07/2022   HGB 14.9 06/07/2022   HCT 42.6 06/07/2022   MCV 87.5 06/07/2022   MCH 30.6 06/07/2022   RDW 12.6 06/07/2022   PLT 227 06/07/2022   Last metabolic panel Lab Results  Component Value Date   GLUCOSE 78 06/17/2022   NA 141 06/17/2022   K 4.4 06/17/2022   CL 104 06/17/2022   CO2 21 06/17/2022   BUN 16 06/17/2022   CREATININE 0.98 06/17/2022   EGFR 89 06/17/2022   CALCIUM 9.3 06/17/2022   PROT 6.7 06/17/2022   ALBUMIN 4.2 06/17/2022   LABGLOB 2.5 06/17/2022   AGRATIO 1.7 06/17/2022   BILITOT 0.6 06/17/2022   ALKPHOS 61 06/17/2022   AST 21 06/17/2022   ALT 27 06/17/2022   ANIONGAP 7 06/07/2022   Last lipids Lab Results  Component Value Date   CHOL 156 06/17/2022   HDL 33 (L) 06/17/2022   LDLCALC 90 06/17/2022   TRIG 190 (H) 06/17/2022   CHOLHDL 4.7 06/17/2022   Last hemoglobin A1c Lab Results  Component Value Date   HGBA1C 5.7 (H) 06/17/2022   Last thyroid functions Lab Results  Component Value Date   TSH 2.190 06/17/2022   Last vitamin D Lab Results  Component Value Date   VD25OH 26.8 (L) 06/17/2022   Last vitamin B12 and Folate Lab Results  Component Value Date   VITAMINB12 352 06/17/2022    FOLATE 5.1 06/17/2022     Assessment & Plan:   Problem List Items Addressed This Visit       Barrett's esophagus    Noted on recent EGD from March.  He is currently prescribed Protonix 40 mg twice daily.  Symptoms are well-controlled.  Repeat EGD recommended for 3-5 years.      Facial skin lesion    Dark, discolored skin lesion present under left eyelid.  He reports that this has been present for multiple years but has recently increased in size.  He has requested a referral to dermatology for further evaluation and possible removal. -Dermatology referral placed today      HLD (hyperlipidemia)    Lipid panel updated in January.  Total cholesterol 156 and LDL 90.  He is currently prescribed atorvastatin 20 mg daily. -No medication changes today      Numbness and tingling in left hand    He has established care with orthopedic surgery (Dr. Romeo Apple) for management of CTS.  Surgery is scheduled for 5/15.      Risk factors for obstructive sleep apnea - Primary    Home sleep study previously ordered but not completed.  He continues to endorse fatigue.  His girlfriend states that he snores at night.  He continues to sleep late and falls asleep during the day after sitting in a comfortable position for a brief period of time. -Pulmonology referral placed today for OSA evaluation      Prediabetes    A1c 5.7 on recent labs.  We reviewed the importance of making lifestyle modifications aimed at weight loss and lowering his average blood sugar in order to limit the risk of progressing to diabetes.      Vitamin D insufficiency    Noted on recent labs.  He is currently on daily vitamin D supplementation. -Repeat vitamin D level  at follow-up in 6 months      Return in about 6 months (around 04/09/2023).   Billie Lade, MD

## 2022-10-07 NOTE — Assessment & Plan Note (Signed)
Dark, discolored skin lesion present under left eyelid.  He reports that this has been present for multiple years but has recently increased in size.  He has requested a referral to dermatology for further evaluation and possible removal. -Dermatology referral placed today

## 2022-10-07 NOTE — Patient Instructions (Addendum)
.  It was a pleasure to see you today.  Thank you for giving [place] the opportunity to be involved in your care.  Below is a brief recap of your visit and next steps.  We will plan to see you again in 6 months.  Summary No medication changes today Referral placed for sleep medicine Second shingles vaccine administered today Follow up in 6 months

## 2022-10-07 NOTE — Assessment & Plan Note (Signed)
He has established care with orthopedic surgery (Dr. Romeo Apple) for management of CTS.  Surgery is scheduled for 5/15.

## 2022-10-07 NOTE — Assessment & Plan Note (Signed)
Noted on recent EGD from March.  He is currently prescribed Protonix 40 mg twice daily.  Symptoms are well-controlled.  Repeat EGD recommended for 3-5 years.

## 2022-10-07 NOTE — Assessment & Plan Note (Signed)
A1c 5.7 on recent labs.  We reviewed the importance of making lifestyle modifications aimed at weight loss and lowering his average blood sugar in order to limit the risk of progressing to diabetes.

## 2022-10-07 NOTE — Assessment & Plan Note (Signed)
Noted on recent labs.  He is currently on daily vitamin D supplementation. -Repeat vitamin D level at follow-up in 6 months

## 2022-10-07 NOTE — Assessment & Plan Note (Signed)
Lipid panel updated in January.  Total cholesterol 156 and LDL 90.  He is currently prescribed atorvastatin 20 mg daily. -No medication changes today

## 2022-10-07 NOTE — Assessment & Plan Note (Signed)
Home sleep study previously ordered but not completed.  He continues to endorse fatigue.  His girlfriend states that he snores at night.  He continues to sleep late and falls asleep during the day after sitting in a comfortable position for a brief period of time. -Pulmonology referral placed today for OSA evaluation

## 2022-10-12 ENCOUNTER — Encounter: Payer: Self-pay | Admitting: Adult Health

## 2022-10-12 ENCOUNTER — Ambulatory Visit (INDEPENDENT_AMBULATORY_CARE_PROVIDER_SITE_OTHER): Payer: 59 | Admitting: Adult Health

## 2022-10-12 VITALS — BP 118/70 | HR 63 | Ht 72.0 in | Wt 237.0 lb

## 2022-10-12 DIAGNOSIS — G4719 Other hypersomnia: Secondary | ICD-10-CM | POA: Diagnosis not present

## 2022-10-12 DIAGNOSIS — R0683 Snoring: Secondary | ICD-10-CM | POA: Diagnosis not present

## 2022-10-12 NOTE — Assessment & Plan Note (Signed)
Loud snoring, restless sleep, daytime sleepiness, witnessed apneic events.  All concerning for underlying sleep apnea.  With History of coronary artery disease and previous stroke.   Will set patient up for a in lab split-night sleep study.  Patient education given on sleep apnea.  - discussed how weight can impact sleep and risk for sleep disordered breathing - discussed options to assist with weight loss: combination of diet modification, cardiovascular and strength training exercises   - had an extensive discussion regarding the adverse health consequences related to untreated sleep disordered breathing - specifically discussed the risks for hypertension, coronary artery disease, cardiac dysrhythmias, cerebrovascular disease, and diabetes - lifestyle modification discussed   - discussed how sleep disruption can increase risk of accidents, particularly when driving - safe driving practices were discussed   Plan  Patient Instructions  Set up for split night sleep study  Work on healthy weight loss  Do not drive if sleepy  Follow up in 6 weeks to discuss sleep study results and treatment plan .

## 2022-10-12 NOTE — Progress Notes (Signed)
@Patient  ID: Justin Villa, male    DOB: 01/17/1966, 57 y.o.   MRN: [id number]  Chief Complaint  Patient presents with   Consult    Referring provider: Billie Lade, MD  HPI: 57 year old male seen for sleep consult Oct 12, 2022 for snoring, restless sleep, witnessed apneic events, daytime sleepiness  TEST/EVENTS :   10/12/2022 Sleep consult  Patient presents for sleep consult today.  Kindly referred by his primary care provider Dr. Edilia Bo.  Patient complains over the last 2 years that he has had increased loud snoring, restless sleep, witnessed apneic events from his partner, daytime sleepiness.  Patient does not use any sleep aids.  Has never had a sleep study before.  Typically goes to sleep about 8 PM only takes a couple minutes to go to sleep.  Gets up a few times throughout the night.  And gets up at 6 AM.  Patient wakes up feeling tired and unrefreshed.  Epworth score is 21 out of 24.  Typically gets sleepy if he sits down to watch any type of TV, rest, passenger of a car or after eating lunch.  Patient does not use any sleep aids.  Drinks about 2 cups of coffee daily.  No symptoms suspicious for cataplexy or sleep paralysis.  Does not have a history of congestive heart failure.  Does have a history of coronary artery status post CABG.  Also has had a previous stroke. Has no removable dental work.  Weight is up 10 pounds over the last 2 years.  Current weight is at 237 pounds with a BMI at 32.   Medical history significant for coronary artery disease status post CABG, hypertension, carotid stenosis, GERD, Barrett's esophagus, arthritis, hyperlipidemia, prediabetes, vitamin D deficiency  Social history patient is a never smoker.  Social alcohol.  No drug use.  Patient is self-employed.  Does real estate.  Has adult children.  He is divorced.    Past Surgical History:  Procedure Laterality Date   BACK SURGERY     BIOPSY  08/25/2022   Procedure: BIOPSY;  Surgeon: Lanelle Bal, DO;  [facility]: [facility];  Service: Endoscopy;;   CARDIAC CATHETERIZATION N/A 11/26/2015   Procedure: Left Heart Cath and Coronary Angiography;  Surgeon: Lyn Records, MD;  Location: [facility] INVASIVE CV LAB;  Service: Cardiovascular;  Laterality: N/A;   COLONOSCOPY WITH PROPOFOL N/A 08/25/2022   Procedure: COLONOSCOPY WITH PROPOFOL;  Surgeon: Lanelle Bal, DO;  Location: [facility];  Service: Endoscopy;  Laterality: N/A;  10:30am, asa 3   CORONARY ARTERY BYPASS GRAFT N/A 11/27/2015   Procedure: CORONARY ARTERY BYPASS GRAFTING (CABG) x 5 (LIMA to LAD, SVG to DIAGONAL, SVG SEQUENTIALLY to OM1 and OM2, SVG to PDA) with EVH from right leg using greater saphenous vein and mammary.;  Surgeon: Delight Ovens, MD;  Location: [facility] OR;  Service: Open Heart Surgery;  Laterality: N/A;   ESOPHAGOGASTRODUODENOSCOPY (EGD) WITH PROPOFOL N/A 08/25/2022   Procedure: ESOPHAGOGASTRODUODENOSCOPY (EGD) WITH PROPOFOL;  Surgeon: Lanelle Bal, DO;  Location: [facility];  Service: Endoscopy;  Laterality: N/A;   INCISION AND DRAINAGE ABSCESS Right 06/16/2020   Procedure: INCISION AND DRAINAGE ABSCESS RIGHT PINKY TOE (5th toe);  Surgeon: Vickki Hearing, MD;  Location: [facility];  Service: Orthopedics;  Laterality: Right;   INTRAOPERATIVE TRANSESOPHAGEAL ECHOCARDIOGRAM N/A 11/27/2015   Procedure: INTRAOPERATIVE TRANSESOPHAGEAL ECHOCARDIOGRAM;  Surgeon: Delight Ovens, MD;  Location: [facility] OR;  Service: Open Heart Surgery;  Laterality: N/A;  KNEE SURGERY Left    arthroscopy   POLYPECTOMY  08/25/2022   Procedure: POLYPECTOMY;  Surgeon: Lanelle Bal, DO;  Location[facility];  Service: Endoscopy;;     No Known Allergies  Immunization History  Administered Date(s) Administered   Tdap 06/17/2022   Zoster Recombinat (Shingrix) 06/17/2022, 10/07/2022    Past Medical History:  Diagnosis Date   CAD (coronary artery disease) 11/27/2015   a. S/p NSTEMI 6/17 >> s/p CABG // b. LHC 6/17: oLAD 50,  mLAD 100, D1 70, D2 95, oLCx 95, mLCx 65, OM1 85, OM2 90, mRCA 85, dRCA 75, RPDA 40/50, RPLB2 50    Carotid stenosis    a. Carotid US 6/17: bilat ICA 1-39%   Essential hypertension    GERD (gastroesophageal reflux disease)    History of non-ST elevation myocardial infarction (NSTEMI) 11/2015   Hyperlipidemia     Tobacco History: Social History   Tobacco Use  Smoking Status Never  Smokeless Tobacco Never   Counseling given: Not Answered   Outpatient Medications Prior to Visit  Medication Sig Dispense Refill   aspirin EC 81 MG EC tablet Take 1 tablet (81 mg total) by mouth daily.     atorvastatin (LIPITOR) 20 MG tablet TAKE ONE TABLET BY MOUTH DAILY 90 tablet 3   Cholecalciferol (VITAMIN D) 50 MCG (2000 UT) CAPS Take 2,000 Units by mouth daily.     Elastic Bandages & Supports (WRIST SPLINT/COCK-UP/LEFT L) MISC 1 each by Does not apply route at bedtime. 1 each 0   lisinopril (ZESTRIL) 40 MG tablet TAKE ONE TABLET BY MOUTH DAILY 90 tablet 3   metoprolol succinate (TOPROL-XL) 25 MG 24 hr tablet TAKE ONE TABLET BY MOUTH DAILY 90 tablet 3   nitroGLYCERIN (NITROSTAT) 0.4 MG SL tablet Place 1 tablet (0.4 mg total) under the tongue every 5 (five) minutes x 3 doses as needed for chest pain (if no relief after 2nd dose, proceed to ED or call 911). 25 tablet 3   pantoprazole (PROTONIX) 40 MG tablet Take 1 tablet (40 mg total) by mouth 2 (two) times daily before a meal. 60 tablet 3   gabapentin (NEURONTIN) 100 MG capsule Take 1 capsule (100 mg total) by mouth at bedtime. 30 capsule 2   Pyridoxine HCl (B-6) 100 MG TABS Take 100 mg by mouth in the morning and at bedtime. 60 tablet 1   No facility-administered medications prior to visit.     Review of Systems:   Constitutional:   No  weight loss, night sweats,  Fevers, chills,  +fatigue, or  lassitude.  HEENT:   No headaches,  Difficulty swallowing,  Tooth/dental problems, or  Sore throat,                No sneezing, itching, ear ache, nasal  congestion, post nasal drip,   CV:  No chest pain,  Orthopnea, PND, swelling in lower extremities, anasarca, dizziness, palpitations, syncope.   GI  No heartburn, indigestion, abdominal pain, nausea, vomiting, diarrhea, change in bowel habits, loss of appetite, bloody stools.   Resp: No shortness of breath with exertion or at rest.  No excess mucus, no productive cough,  No non-productive cough,  No coughing up of blood.  No change in color of mucus.  No wheezing.  No chest wall deformity  Skin: no rash or lesions.  GU: no dysuria, change in color of urine, no urgency or frequency.  No flank pain, no hematuria   MS:  No joint pain or  swelling.  No decreased range of motion.  No back pain.    Physical Exam  BP 118/70 (BP Location: Left Arm, Patient Position: Sitting, Cuff Size: Large)   Pulse 63   Ht 6' (1.829 m)   Wt 237 lb (107.5 kg)   SpO2 97%   BMI 32.14 kg/m   GEN: A/Ox3; pleasant , NAD, well nourished    HEENT:  [place]/AT,   NOSE-clear, THROAT-clear, no lesions, no postnasal drip or exudate noted. Class 2-3 MP airway   NECK:  Supple w/ fair ROM; no JVD; normal carotid impulses w/o bruits; no thyromegaly or nodules palpated; no lymphadenopathy.    RESP  Clear  P & A; w/o, wheezes/ rales/ or rhonchi. no accessory muscle use, no dullness to percussion  CARD:  RRR, no m/r/g, no peripheral edema, pulses intact, no cyanosis or clubbing.  GI:   Soft & nt; nml bowel sounds; no organomegaly or masses detected.   Musco: Warm bil, no deformities or joint swelling noted.   Neuro: alert, no focal deficits noted.    Skin: Warm, no lesions or rashes    Lab Results:  CBC    ProBNP No results found for: "PROBNP"  Imaging: No results found.       Latest Ref Rng & Units 11/26/2015   10:52 AM  PFT Results  FVC-Pre L 3.83   FVC-Predicted Pre % 71   FVC-Post L 4.18   FVC-Predicted Post % 78   Pre FEV1/FVC % % 85   Post FEV1/FCV % % 86   FEV1-Pre L 3.26    FEV1-Predicted Pre % 79   FEV1-Post L 3.61     No results found for: "NITRICOXIDE"      Assessment & Plan:   No problem-specific Assessment & Plan notes found for this encounter.     Rubye Oaks, NP 10/12/2022

## 2022-10-12 NOTE — Progress Notes (Signed)
No flu vaccine for 2023.  Had 1st covid vaccine.

## 2022-10-12 NOTE — Patient Instructions (Signed)
Set up for split night sleep study  Work on healthy weight loss  Do not drive if sleepy  Follow up in 6 weeks to discuss sleep study results and treatment plan .

## 2022-10-13 NOTE — Progress Notes (Signed)
Reviewed and agree with assessment/plan.   Graci Hulce, MD [facility] 10/13/2022, 10:48 AM Pager:  [phone]  

## 2022-10-14 ENCOUNTER — Encounter (HOSPITAL_COMMUNITY)
Admission: RE | Admit: 2022-10-14 | Discharge: 2022-10-14 | Disposition: A | Discharge status: Home / Self Care | Payer: 59 | Source: Ambulatory Visit | Attending: Orthopedic Surgery | Admitting: Orthopedic Surgery

## 2022-10-14 ENCOUNTER — Encounter (HOSPITAL_COMMUNITY): Payer: Self-pay

## 2022-10-14 DIAGNOSIS — Z01818 Encounter for other preprocedural examination: Secondary | ICD-10-CM

## 2022-10-14 DIAGNOSIS — G5602 Carpal tunnel syndrome, left upper limb: Secondary | ICD-10-CM | POA: Insufficient documentation

## 2022-10-14 DIAGNOSIS — I10 Essential (primary) hypertension: Secondary | ICD-10-CM

## 2022-10-14 HISTORY — DX: Cerebral infarction, unspecified: I63.9

## 2022-10-14 LAB — BASIC METABOLIC PANEL
Anion gap: 10 (ref 5–15)
BUN: 19 mg/dL (ref 6–20)
CO2: 20 mmol/L — ABNORMAL LOW (ref 22–32)
Calcium: 8.8 mg/dL — ABNORMAL LOW (ref 8.9–10.3)
Chloride: 105 mmol/L (ref 98–111)
Creatinine, Ser: 1.03 mg/dL (ref 0.61–1.24)
GFR, Estimated: >60 mL/min (ref >60)
Glucose, Bld: 142 mg/dL — ABNORMAL HIGH (ref 70–99)
Potassium: 3.6 mmol/L (ref 3.5–5.1)
Sodium: 135 mmol/L (ref 135–145)

## 2022-10-14 LAB — CBC WITH DIFFERENTIAL/PLATELET
Abs Immature Granulocytes: 0.01 10*3/uL (ref 0.00–0.07)
Basophils Absolute: 0.1 10*3/uL (ref 0.0–0.1)
Basophils Relative: 1 %
Eosinophils Absolute: 0.4 10*3/uL (ref 0.0–0.5)
Eosinophils Relative: 6 %
HCT: 39.9 % (ref 39.0–52.0)
Hemoglobin: 13.9 g/dL (ref 13.0–17.0)
Immature Granulocytes: 0 %
Lymphocytes Relative: 37 %
Lymphs Abs: 2.2 10*3/uL (ref 0.7–4.0)
MCH: 30.4 pg (ref 26.0–34.0)
MCHC: 34.8 g/dL (ref 30.0–36.0)
MCV: 87.3 fL (ref 80.0–100.0)
Monocytes Absolute: 0.4 10*3/uL (ref 0.1–1.0)
Monocytes Relative: 7 %
Neutro Abs: 2.8 10*3/uL (ref 1.7–7.7)
Neutrophils Relative %: 49 %
Platelets: 278 10*3/uL (ref 150–400)
RBC: 4.57 MIL/uL (ref 4.22–5.81)
RDW: 13.9 % (ref 11.5–15.5)
WBC: 5.8 10*3/uL (ref 4.0–10.5)
nRBC: 0 % (ref 0.0–0.2)

## 2022-10-18 NOTE — H&P (Addendum)
Outpatient history and physical for left carpal tunnel release       Chief Complaint  Patient presents with   Hand Pain      Left hand pain      This is a 57 year old right-hand-dominant male presented with numbness and tingling of his thumb index and long finger for period of approximately 6 months.  The symptoms progressed to include night pain and weakness when picking up small objects  He was put on a trial of prednisone, gabapentin and vitamin B6 along with previous splinting but did not improve and is opted for left carpal tunnel release surgery  Review of systems aching toothache like pain right shoulder with night pain painful forward elevation    Past Medical History:  Diagnosis Date   CAD (coronary artery disease) 11/27/2015   a. S/p NSTEMI 6/17 >> s/p CABG // b. LHC 6/17: oLAD 50, mLAD 100, D1 70, D2 95, oLCx 95, mLCx 65, OM1 85, OM2 90, mRCA 85, dRCA 75, RPDA 40/50, RPLB2 50    Carotid stenosis    a. Carotid US 6/17: bilat ICA 1-39%   Essential hypertension    GERD (gastroesophageal reflux disease)    History of non-ST elevation myocardial infarction (NSTEMI) 11/2015   Hyperlipidemia    Stroke [facility])    Past Surgical History:  Procedure Laterality Date   BACK SURGERY     BIOPSY  08/25/2022   Procedure: BIOPSY;  Surgeon: Lanelle Bal, DO;  Location: [facility];  Service: Endoscopy;;   CARDIAC CATHETERIZATION N/A 11/26/2015   Procedure: Left Heart Cath and Coronary Angiography;  Surgeon: Lyn Records, MD;  Location: [facility] INVASIVE CV LAB;  Service: Cardiovascular;  Laterality: N/A;   COLONOSCOPY WITH PROPOFOL N/A 08/25/2022   Procedure: COLONOSCOPY WITH PROPOFOL;  Surgeon: Lanelle Bal, DO;  Location: [facility];  Service: Endoscopy;  Laterality: N/A;  10:30am, asa 3   CORONARY ARTERY BYPASS GRAFT N/A 11/27/2015   Procedure: CORONARY ARTERY BYPASS GRAFTING (CABG) x 5 (LIMA to LAD, SVG to DIAGONAL, SVG SEQUENTIALLY to OM1 and OM2, SVG to PDA) with EVH from  right leg using greater saphenous vein and mammary.;  Surgeon: Delight Ovens, MD;  Location: [facility] OR;  Service: Open Heart Surgery;  Laterality: N/A;   ESOPHAGOGASTRODUODENOSCOPY (EGD) WITH PROPOFOL N/A 08/25/2022   Procedure: ESOPHAGOGASTRODUODENOSCOPY (EGD) WITH PROPOFOL;  Surgeon: Lanelle Bal, DO;  Location: [facility];  Service: Endoscopy;  Laterality: N/A;   INCISION AND DRAINAGE ABSCESS Right 06/16/2020   Procedure: INCISION AND DRAINAGE ABSCESS RIGHT PINKY TOE (5th toe);  Surgeon: Vickki Hearing, MD;  Location: [facility] ORS;  Service: Orthopedics;  Laterality: Right;   INTRAOPERATIVE TRANSESOPHAGEAL ECHOCARDIOGRAM N/A 11/27/2015   Procedure: INTRAOPERATIVE TRANSESOPHAGEAL ECHOCARDIOGRAM;  Surgeon: Delight Ovens, MD;  Location: [facility] OR;  Service: Open Heart Surgery;  Laterality: N/A;   KNEE SURGERY Left    arthroscopy   POLYPECTOMY  08/25/2022   Procedure: POLYPECTOMY;  Surgeon: Lanelle Bal, DO;  Location: [facility];  Service: Endoscopy;;   Family History  Problem Relation Age of Onset   Heart disease Father    Social History   Tobacco Use   Smoking status: Never   Smokeless tobacco: Never  Vaping Use   Vaping Use: Never used  Substance Use Topics   Alcohol use: Yes    Alcohol/week: 2.0 standard drinks of alcohol    Types: 2 Shots of liquor per week    Comment: everyother day  Drug use: No   No Known Allergies  Physical Exam Vitals and nursing note reviewed.  Constitutional:      Appearance: Normal appearance.  HENT:     Head: Normocephalic and atraumatic.  Eyes:     General: No scleral icterus.       Right eye: No discharge.        Left eye: No discharge.     Extraocular Movements: Extraocular movements intact.     Conjunctiva/sclera: Conjunctivae normal.     Pupils: Pupils are equal, round, and reactive to light.  Cardiovascular:     Rate and Rhythm: Normal rate.     Pulses: Normal pulses.  Musculoskeletal:     Comments: Right shoulder    Tenderness in the rotator interval, nontender posterior acromion and lateral deltoid   5- out of 5 abduction flexion   Positive empty   Normal active passive range of motion     Left hand Mild tenderness over the carpal tunnel No atrophy but grip strength weakness right to left Sensory loss median nerve distribution  Skin:    General: Skin is warm and dry.     Capillary Refill: Capillary refill takes less than 2 seconds.  Neurological:     General: No focal deficit present.     Mental Status: He is alert and oriented to person, place, and time.  Psychiatric:        Mood and Affect: Mood normal.        Behavior: Behavior normal.        Thought Content: Thought content normal.        Judgment: Judgment normal.      Assessment and plan  Diagnosis left carpal tunnel syndrome  Procedure planned left carpal tunnel release

## 2022-10-19 ENCOUNTER — Ambulatory Visit (HOSPITAL_COMMUNITY)
Admission: RE | Admit: 2022-10-19 | Discharge: 2022-10-19 | Disposition: A | Discharge status: Home / Self Care | Payer: 59 | Attending: Orthopedic Surgery | Admitting: Orthopedic Surgery

## 2022-10-19 ENCOUNTER — Encounter (HOSPITAL_COMMUNITY): Payer: Self-pay | Admitting: Orthopedic Surgery

## 2022-10-19 ENCOUNTER — Encounter (HOSPITAL_COMMUNITY)
Admission: RE | Disposition: A | Discharge status: Home / Self Care | Payer: Self-pay | Source: Home / Self Care | Attending: Orthopedic Surgery

## 2022-10-19 ENCOUNTER — Other Ambulatory Visit: Payer: Self-pay

## 2022-10-19 ENCOUNTER — Ambulatory Visit (HOSPITAL_BASED_OUTPATIENT_CLINIC_OR_DEPARTMENT_OTHER): Payer: 59 | Admitting: Anesthesiology

## 2022-10-19 ENCOUNTER — Ambulatory Visit (HOSPITAL_COMMUNITY): Payer: 59 | Admitting: Anesthesiology

## 2022-10-19 DIAGNOSIS — Z951 Presence of aortocoronary bypass graft: Secondary | ICD-10-CM | POA: Insufficient documentation

## 2022-10-19 DIAGNOSIS — K219 Gastro-esophageal reflux disease without esophagitis: Secondary | ICD-10-CM | POA: Diagnosis not present

## 2022-10-19 DIAGNOSIS — I2510 Atherosclerotic heart disease of native coronary artery without angina pectoris: Secondary | ICD-10-CM

## 2022-10-19 DIAGNOSIS — G5602 Carpal tunnel syndrome, left upper limb: Secondary | ICD-10-CM

## 2022-10-19 DIAGNOSIS — I10 Essential (primary) hypertension: Secondary | ICD-10-CM | POA: Diagnosis not present

## 2022-10-19 DIAGNOSIS — Z79899 Other long term (current) drug therapy: Secondary | ICD-10-CM | POA: Insufficient documentation

## 2022-10-19 DIAGNOSIS — I252 Old myocardial infarction: Secondary | ICD-10-CM | POA: Insufficient documentation

## 2022-10-19 HISTORY — PX: CARPAL TUNNEL RELEASE: SHX101

## 2022-10-19 SURGERY — CARPAL TUNNEL RELEASE
Anesthesia: Regional | Site: Hand | Laterality: Left

## 2022-10-19 MED ORDER — ONDANSETRON HCL 4 MG/2ML IJ SOLN: 4.0000 mg | Freq: Once | INTRAMUSCULAR | Status: DC | PRN

## 2022-10-19 MED ORDER — BUPIVACAINE HCL (PF) 0.5 % IJ SOLN
INTRAMUSCULAR | Status: AC
  Filled 2022-10-19: qty 30

## 2022-10-19 MED ORDER — LACTATED RINGERS IV SOLN: INTRAVENOUS | Status: DC

## 2022-10-19 MED ORDER — FENTANYL CITRATE (PF) 100 MCG/2ML IJ SOLN
INTRAMUSCULAR | Status: DC | PRN
  Administered 2022-10-19: 25 ug via INTRAVENOUS

## 2022-10-19 MED ORDER — PROPOFOL 500 MG/50ML IV EMUL
INTRAVENOUS | Status: DC | PRN
  Administered 2022-10-19: 50 ug/kg/min via INTRAVENOUS

## 2022-10-19 MED ORDER — MIDAZOLAM HCL 5 MG/5ML IJ SOLN
INTRAMUSCULAR | Status: DC | PRN
  Administered 2022-10-19: 2 mg via INTRAVENOUS

## 2022-10-19 MED ORDER — LIDOCAINE HCL (PF) 0.5 % IJ SOLN
INTRAMUSCULAR | Status: DC | PRN
  Administered 2022-10-19: 250 mg via INTRAVENOUS

## 2022-10-19 MED ORDER — MEPERIDINE HCL 50 MG/ML IJ SOLN: 6.2500 mg | INTRAMUSCULAR | Status: DC | PRN

## 2022-10-19 MED ORDER — MIDAZOLAM HCL 2 MG/2ML IJ SOLN
INTRAMUSCULAR | Status: AC
  Filled 2022-10-19: qty 2

## 2022-10-19 MED ORDER — BUPIVACAINE HCL (PF) 0.5 % IJ SOLN
INTRAMUSCULAR | Status: DC | PRN
  Administered 2022-10-19: 10 mL

## 2022-10-19 MED ORDER — LIDOCAINE HCL (PF) 0.5 % IJ SOLN
INTRAMUSCULAR | Status: AC
  Filled 2022-10-19: qty 50

## 2022-10-19 MED ORDER — CHLORHEXIDINE GLUCONATE 0.12 % MT SOLN
15.0000 mL | Freq: Once | OROMUCOSAL | Status: AC
  Administered 2022-10-19: 15 mL via OROMUCOSAL

## 2022-10-19 MED ORDER — LIDOCAINE HCL 1 % IJ SOLN
INTRAMUSCULAR | Status: DC | PRN
  Administered 2022-10-19: 40 mg via INTRADERMAL

## 2022-10-19 MED ORDER — CEFAZOLIN SODIUM-DEXTROSE 2-4 GM/100ML-% IV SOLN
INTRAVENOUS | Status: AC
  Filled 2022-10-19: qty 100

## 2022-10-19 MED ORDER — FENTANYL CITRATE (PF) 100 MCG/2ML IJ SOLN
INTRAMUSCULAR | Status: AC
  Filled 2022-10-19: qty 2

## 2022-10-19 MED ORDER — CEFAZOLIN SODIUM-DEXTROSE 2-4 GM/100ML-% IV SOLN
2.0000 g | INTRAVENOUS | Status: AC
  Administered 2022-10-19: 2 g via INTRAVENOUS

## 2022-10-19 MED ORDER — HYDROCODONE-ACETAMINOPHEN 5-325 MG PO TABS: 1.0000 | ORAL_TABLET | ORAL | 0 refills | Status: DC | PRN

## 2022-10-19 MED ORDER — 0.9 % SODIUM CHLORIDE (POUR BTL) OPTIME
TOPICAL | Status: DC | PRN
  Administered 2022-10-19: 1000 mL

## 2022-10-19 MED ORDER — ORAL CARE MOUTH RINSE: 15.0000 mL | Freq: Once | OROMUCOSAL | Status: AC

## 2022-10-19 MED ORDER — HYDROMORPHONE HCL 1 MG/ML IJ SOLN: 0.2500 mg | INTRAMUSCULAR | Status: DC | PRN

## 2022-10-19 SURGICAL SUPPLY — 37 items
APL PRP STRL LF DISP 70% ISPRP (MISCELLANEOUS) ×1
BANDAGE ESMARK 4X12 BL STRL LF (DISPOSABLE) ×1 IMPLANT
BLADE SURG 15 STRL LF DISP TIS (BLADE) ×1 IMPLANT
BLADE SURG 15 STRL SS (BLADE) ×1
BNDG CMPR 12X4 ELC STRL LF (DISPOSABLE) ×1
BNDG CMPR STD VLCR NS LF 5.8X3 (GAUZE/BANDAGES/DRESSINGS) ×1
BNDG ELASTIC 3X5.8 VLCR NS LF (GAUZE/BANDAGES/DRESSINGS) ×1 IMPLANT
BNDG ESMARK 4X12 BLUE STRL LF (DISPOSABLE) ×1
BNDG GAUZE ELAST 4 BULKY (GAUZE/BANDAGES/DRESSINGS) ×1 IMPLANT
BNDG GAUZE ROLL STR 2.25X3YD (GAUZE/BANDAGES/DRESSINGS) IMPLANT
BNDG GZE SM 3X2.25 6 PLY (GAUZE/BANDAGES/DRESSINGS) ×1
CHLORAPREP W/TINT 26 (MISCELLANEOUS) ×1 IMPLANT
CLOTH BEACON ORANGE TIMEOUT ST (SAFETY) ×1 IMPLANT
COVER LIGHT HANDLE STERIS (MISCELLANEOUS) ×2 IMPLANT
CUFF TOURN SGL QUICK 18X4 (TOURNIQUET CUFF) ×1 IMPLANT
ELECT NDL TIP 2.8 STRL (NEEDLE) IMPLANT
ELECT NEEDLE TIP 2.8 STRL (NEEDLE) IMPLANT
ELECT REM PT RETURN 9FT ADLT (ELECTROSURGICAL) ×1
ELECTRODE REM PT RTRN 9FT ADLT (ELECTROSURGICAL) ×1 IMPLANT
GAUZE SPONGE 4X4 12PLY STRL (GAUZE/BANDAGES/DRESSINGS) ×1 IMPLANT
GAUZE XEROFORM 1X8 LF (GAUZE/BANDAGES/DRESSINGS) ×1 IMPLANT
GLOVE BIOGEL PI IND STRL 7.0 (GLOVE) ×2 IMPLANT
GLOVE SS N UNI LF 8.5 STRL (GLOVE) ×1 IMPLANT
GLOVE SURG POLYISO LF SZ8 (GLOVE) ×1 IMPLANT
GOWN STRL REUS W/TWL LRG LVL3 (GOWN DISPOSABLE) ×1 IMPLANT
GOWN STRL REUS W/TWL XL LVL3 (GOWN DISPOSABLE) ×1 IMPLANT
KIT TURNOVER KIT A (KITS) ×1 IMPLANT
MANIFOLD NEPTUNE II (INSTRUMENTS) ×1 IMPLANT
NDL HYPO 21X1.5 SAFETY (NEEDLE) ×1 IMPLANT
NEEDLE HYPO 21X1.5 SAFETY (NEEDLE) ×1 IMPLANT
NS IRRIG 1000ML POUR BTL (IV SOLUTION) ×1 IMPLANT
PACK BASIC LIMB (CUSTOM PROCEDURE TRAY) ×1 IMPLANT
PAD ARMBOARD 7.5X6 YLW CONV (MISCELLANEOUS) ×1 IMPLANT
POSITIONER HAND ALUMI XLG (MISCELLANEOUS) ×1 IMPLANT
SET BASIN LINEN APH (SET/KITS/TRAYS/PACK) ×1 IMPLANT
SUT ETHILON 3 0 FSL (SUTURE) ×1 IMPLANT
SYR CONTROL 10ML LL (SYRINGE) ×1 IMPLANT

## 2022-10-19 NOTE — Op Note (Signed)
10/19/2022  9:16 AM  PATIENT:  Justin Villa  57 y.o. male  PRE-OPERATIVE DIAGNOSIS:  left carpal tunnel syndrome  POST-OPERATIVE DIAGNOSIS:  left carpal tunnel syndrome  PROCEDURE:  Procedure(s): CARPAL TUNNEL RELEASE (Left) 64721  FINDINGS:   -Color Gray  -Shape flat  -Synovitis none  The patient was seen in preop the surgical site was confirmed as left hand wrist and marked.  Chart review was completed.  Patient was taken to the operating room for Bier block.  He was in the spine position  The hand and wrist and forearm in the supine position.  After sterile prep and drape and timeout the incision was made on the radial side of the ring finger divided down to the fascia a blunt instrument was placed under the transverse carpal ligament and then the transverse carpal ligament was sharply divided the wound was irrigated no space-occupying lesions were found there was no synovitis.  The nerve was just flat and gray  It was also very small  The wound was irrigated and closed with interrupted 3-0 nylon sutures with 9 sutures  We injected Marcaine plain 10 cc in the wound  Sterile dressing was applied and the patient was taken to recovery in stable condition  SURGEON:  Surgeon(s) and Role:    * Vickki Hearing, MD - Primary  PHYSICIAN ASSISTANT:   ASSISTANTS: none   ANESTHESIA:   regional BIER BLOCK   EBL:  0 mL   BLOOD ADMINISTERED:none  DRAINS: none   LOCAL MEDICATIONS USED:  MARCAINE     SPECIMEN:  No Specimen  DISPOSITION OF SPECIMEN:  N/A  COUNTS:  YES  TOURNIQUET:   Total Tourniquet Time Documented: Upper Arm (Left) - 30 minutes Total: Upper Arm (Left) - 30 minutes   DICTATION: .Reubin Milan Dictation  PLAN OF CARE: Discharge to home after PACU  PATIENT DISPOSITION:  PACU - hemodynamically stable.   Delay start of Pharmacological VTE agent (>24hrs) due to surgical blood loss or risk of bleeding: not applicable

## 2022-10-19 NOTE — Interval H&P Note (Signed)
History and Physical Interval Note:  10/19/2022 8:23 AM  Justin Villa  has presented today for surgery, with the diagnosis of left carpal tunnel syndrome.  The various methods of treatment have been discussed with the patient and family. After consideration of risks, benefits and other options for treatment, the patient has consented to  Procedure(s): CARPAL TUNNEL RELEASE (Left) as a surgical intervention.  The patient's history has been reviewed, patient examined, no change in status, stable for surgery.  I have reviewed the patient's chart and labs.  Questions were answered to the patient's satisfaction.     Fuller Canada

## 2022-10-19 NOTE — Transfer of Care (Addendum)
Immediate Anesthesia Transfer of Care Note  Patient: Justin Villa  Procedure(s) Performed: CARPAL TUNNEL RELEASE (Left: Hand)  Patient Location: PACU  Anesthesia Type:MAC and Regional  Level of Consciousness: awake  Airway & Oxygen Therapy: Patient Spontanous Breathing  Post-op Assessment: Report given to RN  Post vital signs: Reviewed and stable  Last Vitals:  Vitals Value Taken Time  BP 140/80 10/19/22 0920  Temp    Pulse 48 10/19/22 0924  Resp 18 10/19/22 0924  SpO2 97 % 10/19/22 0924  Vitals shown include unvalidated device data.  Last Pain:  Vitals:   10/19/22 0748  PainSc: 0-No pain         Complications: No notable events documented.

## 2022-10-19 NOTE — Anesthesia Preprocedure Evaluation (Addendum)
Anesthesia Evaluation  Patient identified by MRN, date of birth, ID band Patient awake    Reviewed: Allergy & Precautions, H&P , NPO status , Patient's Chart, lab work & pertinent test results, reviewed documented beta blocker date and time   Airway Mallampati: II  TM Distance: >3 FB Neck ROM: Full    Dental  (+) Caps, Dental Advisory Given   Pulmonary neg pulmonary ROS   Pulmonary exam normal breath sounds clear to auscultation       Cardiovascular hypertension, Pt. on medications and Pt. on home beta blockers + CAD, + Past MI and + CABG  Normal cardiovascular exam Rhythm:Regular Rate:Normal     Neuro/Psych CVA (denied h/o stroke)  negative psych ROS   GI/Hepatic Neg liver ROS,GERD  Medicated,,  Endo/Other  negative endocrine ROS    Renal/GU negative Renal ROS  negative genitourinary   Musculoskeletal  (+) Arthritis ,    Abdominal   Peds negative pediatric ROS (+)  Hematology negative hematology ROS (+)   Anesthesia Other Findings Numbness and tingling in left hand  Reproductive/Obstetrics negative OB ROS                             Anesthesia Physical Anesthesia Plan  ASA: 3  Anesthesia Plan: Bier Block and Bier Block-Lidocaine Only   Post-op Pain Management: Dilaudid IV and Minimal or no pain anticipated   Induction:   PONV Risk Score and Plan: 1 and Ondansetron  Airway Management Planned: Nasal Cannula, Natural Airway and Simple Face Mask  Additional Equipment:   Intra-op Plan:   Post-operative Plan:   Informed Consent: I have reviewed the patients History and Physical, chart, labs and discussed the procedure including the risks, benefits and alternatives for the proposed anesthesia with the patient or authorized representative who has indicated his/her understanding and acceptance.     Dental advisory given  Plan Discussed with: CRNA and Surgeon  Anesthesia Plan  Comments:        Anesthesia Quick Evaluation

## 2022-10-19 NOTE — Anesthesia Postprocedure Evaluation (Signed)
Anesthesia Post Note  Patient: Justin Villa  Procedure(s) Performed: CARPAL TUNNEL RELEASE (Left: Hand)  Patient location during evaluation: Phase II Anesthesia Type: Bier Block Level of consciousness: awake and alert and oriented Pain management: pain level controlled Vital Signs Assessment: post-procedure vital signs reviewed and stable Respiratory status: spontaneous breathing, nonlabored ventilation and respiratory function stable Cardiovascular status: blood pressure returned to baseline and stable Postop Assessment: no apparent nausea or vomiting Anesthetic complications: no  No notable events documented.   Last Vitals:  Vitals:   10/19/22 0930 10/19/22 0937  BP: (!) 150/93   Pulse: (!) 42 (!) 43  Resp: 15 12  Temp:    SpO2: 100% 98%    Last Pain:  Vitals:   10/19/22 0921  PainSc: 0-No pain                 Keishawn Darsey C Dnasia Gauna

## 2022-10-19 NOTE — Anesthesia Procedure Notes (Signed)
Anesthesia Regional Block: Bier block (IV Regional)   Pre-Anesthetic Checklist: , timeout performed,  Correct Patient, Correct Site, Correct Laterality,  Correct Procedure,, site marked,  Surgical consent,  At surgeon's request  Laterality: Left         Needles:  Injection technique: Single-shot  Needle Type: Other      Needle Gauge: 22     Additional Needles:   Procedures:,,,,, intact distal pulses, Esmarch exsanguination,  Single tourniquet utilized     Nerve Stimulator or Paresthesia:   Additional Responses:  Pulse checked post tourniquet inflation. IV NSL discontinued post injection. Narrative:  Start time: 10/19/2022 8:35 AM End time: 10/19/2022 8:45 AM  Performed by: Personally

## 2022-10-19 NOTE — Brief Op Note (Signed)
10/19/2022  9:16 AM  PATIENT:  [name]  57 y.o. male  PRE-OPERATIVE DIAGNOSIS:  left carpal tunnel syndrome  POST-OPERATIVE DIAGNOSIS:  left carpal tunnel syndrome  PROCEDURE:  Procedure(s): CARPAL TUNNEL RELEASE (Left)  SURGEON:  Surgeon(s) and Role:    Vickki Hearing, MD - Primary  PHYSICIAN ASSISTANT:   ASSISTANTS: none   ANESTHESIA:   regional BIER BLOCK   EBL:  0 mL   BLOOD ADMINISTERED:none  DRAINS: none   LOCAL MEDICATIONS USED:  MARCAINE     SPECIMEN:  No Specimen  DISPOSITION OF SPECIMEN:  N/A  COUNTS:  YES  TOURNIQUET:   Total Tourniquet Time Documented: Upper Arm (Left) - 30 minutes Total: Upper Arm (Left) - 30 minutes   DICTATION: .Reubin Milan Dictation  PLAN OF CARE: Discharge to home after PACU  PATIENT DISPOSITION:  PACU - hemodynamically stable.   Delay start of Pharmacological VTE agent (>24hrs) due to surgical blood loss or risk of bleeding: not applicable

## 2022-10-25 ENCOUNTER — Encounter (HOSPITAL_COMMUNITY): Payer: Self-pay | Admitting: Orthopedic Surgery

## 2022-10-25 NOTE — Addendum Note (Signed)
Addendum  created 10/25/22 0915 by Lorin Glass, CRNA   Clinical Note Signed

## 2022-11-03 ENCOUNTER — Encounter: Payer: Self-pay | Admitting: Orthopedic Surgery

## 2022-11-03 ENCOUNTER — Ambulatory Visit (INDEPENDENT_AMBULATORY_CARE_PROVIDER_SITE_OTHER): Payer: 59 | Admitting: Orthopedic Surgery

## 2022-11-03 VITALS — BP 118/74 | HR 57 | Ht 72.0 in | Wt 236.0 lb

## 2022-11-03 DIAGNOSIS — G5602 Carpal tunnel syndrome, left upper limb: Secondary | ICD-10-CM

## 2022-11-03 DIAGNOSIS — T8149XA Infection following a procedure, other surgical site, initial encounter: Secondary | ICD-10-CM

## 2022-11-03 MED ORDER — DOXYCYCLINE HYCLATE 100 MG PO TABS
100.0000 mg | ORAL_TABLET | Freq: Two times a day (BID) | ORAL | 1 refills | Status: DC
Start: 2022-11-03 — End: 2023-01-29

## 2022-11-03 NOTE — Progress Notes (Signed)
  Postop carpal tunnel release surgery 10/19/2022.  Patient noticed some oozing around his incision and some erythema  He was scheduled to come in tomorrow for suture removal  The proximal portion of the wound shows some skin breakdown there is some erythema surrounding no purulence  The patient says his hand feels better and the numbness is less he is not having nighttime pain that is waking him up or numbness at night  Recommend suture removal  Antibiotics  Wound check in 1 week  Meds ordered this encounter  Medications   doxycycline (VIBRA-TABS) 100 MG tablet    Sig: Take 1 tablet (100 mg total) by mouth 2 (two) times daily.    Dispense:  28 tablet    Refill:  1

## 2022-11-04 ENCOUNTER — Other Ambulatory Visit: Payer: Self-pay | Admitting: Cardiology

## 2022-11-11 ENCOUNTER — Ambulatory Visit (INDEPENDENT_AMBULATORY_CARE_PROVIDER_SITE_OTHER): Payer: 59 | Admitting: Orthopedic Surgery

## 2022-11-11 ENCOUNTER — Encounter: Payer: Self-pay | Admitting: Orthopedic Surgery

## 2022-11-11 VITALS — BP 125/75 | HR 67

## 2022-11-11 DIAGNOSIS — G8929 Other chronic pain: Secondary | ICD-10-CM

## 2022-11-11 DIAGNOSIS — M25511 Pain in right shoulder: Secondary | ICD-10-CM

## 2022-11-11 NOTE — Progress Notes (Signed)
Postop status post carpal tunnel release left hand on 10/19/2022  Patient placed on antibiotics doxycycline 100 mg twice a day wound has improved with slight redness.  Patient will continue with antibiotics until they run out  Patient is complaining of right shoulder pain status post fall a year and a half ago.  He did undergo therapy had an injection still complains of pain popping and weakness in the right shoulder  He has decreased range of motion painful forward elevation crepitance on range of motion and weakness in the empty can position  Recommend MRI right shoulder rule out rotator cuff tear

## 2022-11-21 ENCOUNTER — Other Ambulatory Visit: Payer: Self-pay | Admitting: Orthopedic Surgery

## 2022-11-21 DIAGNOSIS — G8929 Other chronic pain: Secondary | ICD-10-CM

## 2022-11-25 ENCOUNTER — Ambulatory Visit (HOSPITAL_BASED_OUTPATIENT_CLINIC_OR_DEPARTMENT_OTHER): Payer: 59

## 2022-11-28 ENCOUNTER — Telehealth: Payer: Self-pay | Admitting: Internal Medicine

## 2022-11-28 NOTE — Telephone Encounter (Signed)
Patient called said dermatologist Dr Nita Sells does not accept his insurance [employer]. Patient is asking can the office verify in network with his insurance first before sending out the referral and contact patient first before scheduling the appointment to make sure it is okay with the patient for the dermatologist office being referred to. Patient call back # [phone] .

## 2022-12-02 ENCOUNTER — Ambulatory Visit: Payer: 59 | Admitting: Orthopedic Surgery

## 2022-12-12 ENCOUNTER — Encounter: Payer: 59 | Admitting: Pulmonary Disease

## 2023-01-29 ENCOUNTER — Emergency Department (HOSPITAL_BASED_OUTPATIENT_CLINIC_OR_DEPARTMENT_OTHER)
Admission: EM | Admit: 2023-01-29 | Discharge: 2023-01-29 | Disposition: A | Discharge status: Home / Self Care | Payer: 59 | Attending: Emergency Medicine | Admitting: Emergency Medicine

## 2023-01-29 ENCOUNTER — Emergency Department (HOSPITAL_BASED_OUTPATIENT_CLINIC_OR_DEPARTMENT_OTHER): Payer: 59

## 2023-01-29 DIAGNOSIS — J40 Bronchitis, not specified as acute or chronic: Secondary | ICD-10-CM | POA: Insufficient documentation

## 2023-01-29 DIAGNOSIS — U071 COVID-19: Secondary | ICD-10-CM

## 2023-01-29 DIAGNOSIS — I2510 Atherosclerotic heart disease of native coronary artery without angina pectoris: Secondary | ICD-10-CM | POA: Diagnosis not present

## 2023-01-29 DIAGNOSIS — R0781 Pleurodynia: Secondary | ICD-10-CM | POA: Diagnosis not present

## 2023-01-29 DIAGNOSIS — R918 Other nonspecific abnormal finding of lung field: Secondary | ICD-10-CM | POA: Diagnosis not present

## 2023-01-29 DIAGNOSIS — Z7982 Long term (current) use of aspirin: Secondary | ICD-10-CM | POA: Diagnosis not present

## 2023-01-29 DIAGNOSIS — I10 Essential (primary) hypertension: Secondary | ICD-10-CM | POA: Diagnosis not present

## 2023-01-29 DIAGNOSIS — Z951 Presence of aortocoronary bypass graft: Secondary | ICD-10-CM | POA: Diagnosis not present

## 2023-01-29 DIAGNOSIS — K209 Esophagitis, unspecified without bleeding: Secondary | ICD-10-CM | POA: Diagnosis not present

## 2023-01-29 DIAGNOSIS — R0602 Shortness of breath: Secondary | ICD-10-CM | POA: Diagnosis not present

## 2023-01-29 LAB — CBC WITH DIFFERENTIAL/PLATELET
Abs Immature Granulocytes: 0.06 10*3/uL (ref 0.00–0.07)
Basophils Absolute: 0 10*3/uL (ref 0.0–0.1)
Basophils Relative: 0 %
Eosinophils Absolute: 0.3 10*3/uL (ref 0.0–0.5)
Eosinophils Relative: 4 %
HCT: 45.7 % (ref 39.0–52.0)
Hemoglobin: 15.5 g/dL (ref 13.0–17.0)
Immature Granulocytes: 1 %
Lymphocytes Relative: 27 %
Lymphs Abs: 1.9 10*3/uL (ref 0.7–4.0)
MCH: 28.4 pg (ref 26.0–34.0)
MCHC: 33.9 g/dL (ref 30.0–36.0)
MCV: 83.7 fL (ref 80.0–100.0)
Monocytes Absolute: 0.5 10*3/uL (ref 0.1–1.0)
Monocytes Relative: 6 %
Neutro Abs: 4.4 10*3/uL (ref 1.7–7.7)
Neutrophils Relative %: 62 %
Platelets: 319 10*3/uL (ref 150–400)
RBC: 5.46 MIL/uL (ref 4.22–5.81)
RDW: 14.7 % (ref 11.5–15.5)
WBC: 7.2 10*3/uL (ref 4.0–10.5)
nRBC: 0 % (ref 0.0–0.2)

## 2023-01-29 LAB — COMPREHENSIVE METABOLIC PANEL
ALT: 27 U/L (ref 0–44)
AST: 24 U/L (ref 15–41)
Albumin: 4.1 g/dL (ref 3.5–5.0)
Alkaline Phosphatase: 52 U/L (ref 38–126)
Anion gap: 10 (ref 5–15)
BUN: 14 mg/dL (ref 6–20)
CO2: 22 mmol/L (ref 22–32)
Calcium: 8.4 mg/dL — ABNORMAL LOW (ref 8.9–10.3)
Chloride: 104 mmol/L (ref 98–111)
Creatinine, Ser: 1.07 mg/dL (ref 0.61–1.24)
GFR, Estimated: >60 mL/min (ref >60)
Glucose, Bld: 161 mg/dL — ABNORMAL HIGH (ref 70–99)
Potassium: 3.8 mmol/L (ref 3.5–5.1)
Sodium: 136 mmol/L (ref 135–145)
Total Bilirubin: 1 mg/dL (ref 0.3–1.2)
Total Protein: 6.6 g/dL (ref 6.5–8.1)

## 2023-01-29 LAB — TROPONIN I (HIGH SENSITIVITY)
Troponin I (High Sensitivity): 14 ng/L (ref <18)
Troponin I (High Sensitivity): 13 ng/L (ref <18)

## 2023-01-29 LAB — BRAIN NATRIURETIC PEPTIDE: B Natriuretic Peptide: 27.4 pg/mL (ref 0.0–100.0)

## 2023-01-29 MED ORDER — ALUM & MAG HYDROXIDE-SIMETH 200-200-20 MG/5ML PO SUSP
30.0000 mL | Freq: Once | ORAL | Status: AC
  Administered 2023-01-29: 30 mL via ORAL
  Filled 2023-01-29: qty 30

## 2023-01-29 MED ORDER — FAMOTIDINE 20 MG PO TABS: 20.0000 mg | ORAL_TABLET | Freq: Two times a day (BID) | ORAL | 0 refills | Status: DC

## 2023-01-29 MED ORDER — BENZONATATE 100 MG PO CAPS: 100.0000 mg | ORAL_CAPSULE | Freq: Three times a day (TID) | ORAL | 0 refills | Status: DC

## 2023-01-29 MED ORDER — PREDNISONE 10 MG PO TABS: 40.0000 mg | ORAL_TABLET | Freq: Every day | ORAL | 0 refills | Status: DC

## 2023-01-29 MED ORDER — IOHEXOL 350 MG/ML SOLN
75.0000 mL | Freq: Once | INTRAVENOUS | Status: AC | PRN
  Administered 2023-01-29: 75 mL via INTRAVENOUS

## 2023-01-29 MED ORDER — LIDOCAINE VISCOUS HCL 2 % MT SOLN
15.0000 mL | Freq: Once | OROMUCOSAL | Status: AC
  Administered 2023-01-29: 15 mL via ORAL
  Filled 2023-01-29: qty 15

## 2023-01-29 MED ORDER — FAMOTIDINE IN NACL 20-0.9 MG/50ML-% IV SOLN
20.0000 mg | Freq: Once | INTRAVENOUS | Status: AC
  Administered 2023-01-29: 20 mg via INTRAVENOUS
  Filled 2023-01-29: qty 50

## 2023-01-29 NOTE — ED Provider Notes (Signed)
[place] EMERGENCY DEPARTMENT AT [facility] Provider Note   CSN: [id number] Arrival date & time: 01/29/23  1009     History  Chief Complaint  Patient presents with   Shortness of Breath    Justin Villa is a 57 y.o. male.   Shortness of Breath    57 year old male with medical history significant for CAD status post CABG, HTN, HLD, GERD, CVA who presents to the emergency department with shortness of breath.  The patient states that he was diagnosed with COVID by home test 14 days ago.  He states that over the last 3 to 4 days he has been having increasing shortness of breath.  He endorses sharp pain in his left upper back, worse when taking a deep breath.  He endorses worsening productive cough.  He denies any history of DVT or PVD.  He is not on anticoagulation.  He denies any fevers or chills.  He denies any lower extremity swelling.  He has had some cramping in his left calf.  Home Medications Prior to Admission medications   Medication Sig Start Date End Date Taking? Authorizing Provider  benzonatate (TESSALON) 100 MG capsule Take 1 capsule (100 mg total) by mouth every 8 (eight) hours. 01/29/23  Yes Ernie Avena, MD  famotidine (PEPCID) 20 MG tablet Take 1 tablet (20 mg total) by mouth 2 (two) times daily. 01/29/23  Yes Ernie Avena, MD  predniSONE (DELTASONE) 10 MG tablet Take 4 tablets (40 mg total) by mouth daily for 5 days. 01/29/23 02/03/23 Yes Ernie Avena, MD  aspirin EC 81 MG EC tablet Take 1 tablet (81 mg total) by mouth daily. 12/01/15   Doree Fudge M, PA-C  atorvastatin (LIPITOR) 20 MG tablet TAKE ONE TABLET BY MOUTH DAILY 11/04/22   Jonelle Sidle, MD  Cholecalciferol (VITAMIN D) 50 MCG (2000 UT) CAPS Take 2,000 Units by mouth daily.    [provider]  clopidogrel (PLAVIX) 75 MG tablet TAKE ONE TABLET BY MOUTH DAILY 11/04/22   Jonelle Sidle, MD  Elastic Bandages & Supports (WRIST SPLINT/COCK-UP/LEFT L) MISC 1 each by Does not  apply route at bedtime. 06/17/22   Billie Lade, MD  lisinopril (ZESTRIL) 40 MG tablet TAKE ONE TABLET BY MOUTH DAILY 11/04/22   Jonelle Sidle, MD  metoprolol succinate (TOPROL-XL) 25 MG 24 hr tablet TAKE ONE TABLET BY MOUTH DAILY 11/04/22   Jonelle Sidle, MD  nitroGLYCERIN (NITROSTAT) 0.4 MG SL tablet Place 1 tablet (0.4 mg total) under the tongue every 5 (five) minutes x 3 doses as needed for chest pain (if no relief after 2nd dose, proceed to ED or call 911). 10/06/21   Jonelle Sidle, MD  pantoprazole (PROTONIX) 40 MG tablet Take 1 tablet (40 mg total) by mouth 2 (two) times daily before a meal. 07/05/22   Gelene Mink, NP      Allergies    Patient has no known allergies.    Review of Systems   Review of Systems  Respiratory:  Positive for shortness of breath.   All other systems reviewed and are negative.   Physical Exam Updated Vital Signs BP 113/87   Pulse 62   Temp 98.3 F (36.8 C) (Oral)   Resp 14   Ht 6' (1.829 m)   Wt 106.6 kg   SpO2 98%   BMI 31.87 kg/m  Physical Exam Vitals and nursing note reviewed.  Constitutional:      General: He is not in acute distress.  Appearance: He is well-developed.  HENT:     Head: Normocephalic and atraumatic.  Eyes:     Conjunctiva/sclera: Conjunctivae normal.  Cardiovascular:     Rate and Rhythm: Normal rate and regular rhythm.     Heart sounds: No murmur heard. Pulmonary:     Effort: Pulmonary effort is normal. No respiratory distress.     Breath sounds: Normal breath sounds.  Abdominal:     Palpations: Abdomen is soft.     Tenderness: There is no abdominal tenderness.  Musculoskeletal:        General: No swelling.     Cervical back: Neck supple.     Right lower leg: No edema.     Left lower leg: No edema.  Skin:    General: Skin is warm and dry.     Capillary Refill: Capillary refill takes less than 2 seconds.  Neurological:     Mental Status: He is alert.  Psychiatric:        Mood and Affect: Mood  normal.     ED Results / Procedures / Treatments   Labs (all labs ordered are listed, but only abnormal results are displayed) Labs Reviewed  COMPREHENSIVE METABOLIC PANEL - Abnormal; Notable for the following components:      Result Value   Glucose, Bld 161 (*)    Calcium 8.4 (*)    All other components within normal limits  CBC WITH DIFFERENTIAL/PLATELET  BRAIN NATRIURETIC PEPTIDE  TROPONIN I (HIGH SENSITIVITY)  TROPONIN I (HIGH SENSITIVITY)    EKG EKG Interpretation Date/Time:  Sunday January 29 2023 10:18:49 EDT Ventricular Rate:  83 PR Interval:  166 QRS Duration:  96 QT Interval:  372 QTC Calculation: 438 R Axis:   -51  Text Interpretation: Sinus rhythm Left anterior fascicular block Low voltage, precordial leads ST elevation, consider inferior injury Confirmed by Ernie Avena ([id number]) on 01/29/2023 10:21:50 AM  Radiology CT Angio Chest PE W and/or Wo Contrast  Result Date: 01/29/2023 CLINICAL DATA:  Shortness of breath with productive cough. COVID diagnosis 2 weeks ago. Pleuritic left chest pain. EXAM: CT ANGIOGRAPHY CHEST WITH CONTRAST TECHNIQUE: Multidetector CT imaging of the chest was performed using the standard protocol during bolus administration of intravenous contrast. Multiplanar CT image reconstructions and MIPs were obtained to evaluate the vascular anatomy. RADIATION DOSE REDUCTION: This exam was performed according to the departmental dose-optimization program which includes automated exposure control, adjustment of the mA and/or kV according to patient size and/or use of iterative reconstruction technique. CONTRAST:  75mL OMNIPAQUE IOHEXOL 350 MG/ML SOLN COMPARISON:  Overlapping portions CT abdomen 06/07/2022 FINDINGS: Cardiovascular: No filling defect is identified in the pulmonary arterial tree to suggest pulmonary embolus. Prior CABG. Coronary, aortic arch, and branch vessel atherosclerotic vascular disease. Mild cardiomegaly. Mediastinum/Nodes: Suspected small  type 1 hiatal hernia. Distal esophageal circumferential wall thickening, nonspecific although esophagitis would be the most common cause. Lungs/Pleura: Airway thickening is present, suggesting bronchitis or reactive airways disease. Upper Abdomen: Unremarkable Musculoskeletal: Unremarkable Review of the MIP images confirms the above findings. IMPRESSION: 1. No filling defect is identified in the pulmonary arterial tree to suggest pulmonary embolus. 2. Airway thickening is present, suggesting bronchitis or reactive airways disease. 3. Distal esophageal circumferential wall thickening, nonspecific although esophagitis would be the most common cause. 4. Aortic and coronary atherosclerosis, prior CABG. Aortic Atherosclerosis (ICD10-I70.0). Electronically Signed   By: Gaylyn Rong M.D.   On: 01/29/2023 13:19   US Venous Img Lower Bilateral  Result Date: 01/29/2023 CLINICAL DATA:  Right  calf cramping for 2 weeks. Shortness of breath for 1 week. Recent COVID infection. EXAM: BILATERAL LOWER EXTREMITY VENOUS DOPPLER ULTRASOUND TECHNIQUE: Gray-scale sonography with graded compression, as well as color Doppler and duplex ultrasound were performed to evaluate the lower extremity deep venous systems from the level of the common femoral vein and including the common femoral, femoral, profunda femoral, popliteal and calf veins including the posterior tibial, peroneal and gastrocnemius veins when visible. The superficial great saphenous vein was also interrogated. Spectral Doppler was utilized to evaluate flow at rest and with distal augmentation maneuvers in the common femoral, femoral and popliteal veins. COMPARISON:  None Available. FINDINGS: RIGHT LOWER EXTREMITY Common Femoral Vein: No evidence of thrombus. Normal compressibility, respiratory phasicity and response to augmentation. Saphenofemoral Junction: No evidence of thrombus. Normal compressibility and flow on color Doppler imaging. Profunda Femoral Vein: No  evidence of thrombus. Normal compressibility and flow on color Doppler imaging. Femoral Vein: No evidence of thrombus. Normal compressibility, respiratory phasicity and response to augmentation. Popliteal Vein: No evidence of thrombus. Normal compressibility, respiratory phasicity and response to augmentation. Calf Veins: No evidence of thrombus. Normal compressibility and flow on color Doppler imaging. Superficial Great Saphenous Vein: No evidence of thrombus. Normal compressibility. Venous Reflux:  None. Other Findings:  None. LEFT LOWER EXTREMITY Common Femoral Vein: No evidence of thrombus. Normal compressibility, respiratory phasicity and response to augmentation. Saphenofemoral Junction: No evidence of thrombus. Normal compressibility and flow on color Doppler imaging. Profunda Femoral Vein: No evidence of thrombus. Normal compressibility and flow on color Doppler imaging. Femoral Vein: No evidence of thrombus. Normal compressibility, respiratory phasicity and response to augmentation. Popliteal Vein: No evidence of thrombus. Normal compressibility, respiratory phasicity and response to augmentation. Calf Veins: No evidence of thrombus. Normal compressibility and flow on color Doppler imaging. Superficial Great Saphenous Vein: No evidence of thrombus. Normal compressibility. Venous Reflux:  None. Other Findings:  None. IMPRESSION: No evidence of deep venous thrombosis in either lower extremity. Electronically Signed   By: Marin Roberts M.D.   On: 01/29/2023 12:29    Procedures Procedures    Medications Ordered in ED Medications  famotidine (PEPCID) IVPB 20 mg premix (20 mg Intravenous New Bag/Given 01/29/23 1432)  iohexol (OMNIPAQUE) 350 MG/ML injection 75 mL (75 mLs Intravenous Contrast Given 01/29/23 1235)  alum & mag hydroxide-simeth (MAALOX/MYLANTA) 200-200-20 MG/5ML suspension 30 mL (30 mLs Oral Given 01/29/23 1433)    And  lidocaine (XYLOCAINE) 2 % viscous mouth solution 15 mL (15 mLs  Oral Given 01/29/23 1433)    ED Course/ Medical Decision Making/ A&P                                 Medical Decision Making Amount and/or Complexity of Data Reviewed Labs: ordered. Radiology: ordered.  Risk OTC drugs. Prescription drug management.    57 year old male with medical history significant for CAD status post CABG, HTN, HLD, GERD, CVA who presents to the emergency department with shortness of breath.  The patient states that he was diagnosed with COVID by home test 14 days ago.  He states that over the last 3 to 4 days he has been having increasing shortness of breath.  He endorses sharp pain in his left upper back, worse when taking a deep breath.  He endorses worsening productive cough.  He denies any history of DVT or PVD.  He is not on anticoagulation.  He denies any fevers or chills.  He  denies any lower extremity swelling.  He has had some cramping in his left calf.  On arrival, the patient was afebrile, not tachycardic heart rate 82, tachypneic RR 29, saturating 98% on room air, BP 160/104.  Physical exam significant for clear lungs to auscultation bilaterally.  Differential diagnosis includes superimposed bacterial pneumonia following the patient's COVID-19 infection, pneumothorax, also considered DVT/PE.  Considered ACS although feel less bronchitis with airway.  Lower concern for acute aortic dissection.  Initial EKG revealed sinus rhythm, ventricular rate 83, nonspecific ST changes in the inferior leads.  Labs: Initial troponin 14, BNP normal, CBC without a leukocytosis or anemia, CMP with hyperglycemia 161 otherwise unremarkable, repeat troponin normal and flat.  DVT US:  IMPRESSION:  No evidence of deep venous thrombosis in either lower extremity.    CTA PE:  IMPRESSION:  1. No filling defect is identified in the pulmonary arterial tree to  suggest pulmonary embolus.  2. Airway thickening is present, suggesting bronchitis or reactive  airways disease.  3.  Distal esophageal circumferential wall thickening, nonspecific  although esophagitis would be the most common cause.  4. Aortic and coronary atherosclerosis, prior CABG.    Aortic Atherosclerosis (ICD10-I70.0).   Suspect likely bronchitis associated with the patient's COVID-19 infection.  No evidence of bacterial pneumonia.  Patient has evidence of esophagitis on CT, administered Maalox and lidocaine in addition to IV Pepcid.  Tolerating oral intake, troponins normal, vitals stable.  Pt tolerating oral intake, vitally stable and well appearing. Will add OTC Malox and Pepcid to the pt's outpatient regimen given the finding of esophagitis. Advised PCP follow-up for a recheck. Will treat for bronchitis with a steroid burst and tessalon.    Final Clinical Impression(s) / ED Diagnoses Final diagnoses:  COVID-19  Esophagitis  Bronchitis    Rx / DC Orders ED Discharge Orders          Ordered    predniSONE (DELTASONE) 10 MG tablet  Daily        01/29/23 1447    benzonatate (TESSALON) 100 MG capsule  Every 8 hours        01/29/23 1447    famotidine (PEPCID) 20 MG tablet  2 times daily        01/29/23 1448              Ernie Avena, MD 01/29/23 1451

## 2023-01-29 NOTE — Discharge Instructions (Addendum)
Your labs and CT imaging were reassuring: IMPRESSION:  1. No filling defect is identified in the pulmonary arterial tree to  suggest pulmonary embolus.  2. Airway thickening is present, suggesting bronchitis or reactive  airways disease.  3. Distal esophageal circumferential wall thickening, nonspecific  although esophagitis would be the most common cause.  4. Aortic and coronary atherosclerosis, prior CABG.    Aortic Atherosclerosis (ICD10-I70.0).   For the CT evidence of esophagitis, this could be causing your pain. Treat with OTC Malox, keep taking your Protonix, and Pepcid has been prescribed. Follow-up with your PCP. Your labs, EKG, and CT imaging was overall reassuring.

## 2023-01-29 NOTE — ED Triage Notes (Signed)
Pt caox4 c/o SOB with productive cough since being dx with COVID 2 weeks ago (s/s started a few days prior to COVID+ home test 14 days ago). Pt reports increased SOB over the past 3-4 days stating he has been getting SOB just from talking. Tachypneic. Denies pulmonary hx. Significant cardiac PMH. Denies pain at present. Pt denies fever in the past week. Pt c/o sharp pain behind L shoulder blade when taking deep breaths that started approx 3-4 days ago as well. Pt was taken off plavix in Jan. Denies hx DVT/PE.

## 2023-01-29 NOTE — ED Notes (Signed)
Blood obtained and IV placed by Triage RN.Marland KitchenMarland Kitchen

## 2023-01-29 NOTE — ED Notes (Signed)
Discharge paperwork given and verbally understood. 

## 2023-02-01 NOTE — Progress Notes (Unsigned)
    Cardiology Office Note  Date: 02/02/2023   ID: Justin Villa, DOB 06/23/1965, MRN [id number]  History of Present Illness: Justin Villa is a 57 y.o. male last seen in December 2023.  He is here today for a follow-up visit.  States that he had COVID-19 diagnosed by home testing about 2 weeks ago.  Had cough and chest congestion, ultimately a fever.  Started to feel better and then he got more short of breath resulting in an ER visit on August 25.  High-sensitivity troponin I levels were normal, ECG showed no acute changes.  BNP was normal.  Lower extremity venous Dopplers were negative for DVT and chest CTA showed no evidence of pulmonary embolus with findings otherwise suggestive of bronchitis or reactive airways disease.  Also incidentally evidence of esophagitis.  He states that even before having COVID-19 he had noticed some dyspnea on exertion, not specifically angina or nitroglycerin use.  Reports compliance with his medications.  Last ischemic evaluation was in 2021.  Physical Exam: VS:  BP 136/80 (BP Location: Left Arm, Patient Position: Sitting, Cuff Size: Normal)   Pulse 64   Ht 6' (1.829 m)   Wt 243 lb 3.2 oz (110.3 kg)   SpO2 97%   BMI 32.98 kg/m , BMI Body mass index is 32.98 kg/m.  Wt Readings from Last 3 Encounters:  02/02/23 243 lb 3.2 oz (110.3 kg)  01/29/23 235 lb (106.6 kg)  11/03/22 236 lb (107 kg)    General: Patient appears comfortable at rest. HEENT: Conjunctiva and lids normal. Neck: Supple, no elevated JVP or carotid bruits. Lungs: Clear to auscultation, nonlabored breathing at rest. Cardiac: Regular rate and rhythm, no S3 or significant systolic murmur, no pericardial rub. Extremities: No pitting edema.  ECG:  An ECG dated 01/29/2023 was personally reviewed today and demonstrated:  Sinus rhythm with left anterior fascicular block.  Labwork: 06/17/2022: TSH 2.190 01/29/2023: ALT 27; AST 24; B Natriuretic Peptide 27.4; BUN 14; Creatinine, Ser 1.07;  Hemoglobin 15.5; Platelets 319; Potassium 3.8; Sodium 136     Component Value Date/Time   CHOL 156 06/17/2022 1011   TRIG 190 (H) 06/17/2022 1011   HDL 33 (L) 06/17/2022 1011   CHOLHDL 4.7 06/17/2022 1011   CHOLHDL 5.4 10/22/2019 0915   VLDL 32 10/22/2019 0915   LDLCALC 90 06/17/2022 1011   Other Studies Reviewed Today:  No interval cardiac testing for review today.  Assessment and Plan:  1.  Multivessel CAD status post CABG in 2017 with LIMA to LAD, SVG to diagonal, SVG to OM1 and OM 2, and SVG to PDA.  Follow-up Myoview in 2021 was low risk with no significant ischemia and normal LVEF.  He reports dyspnea on exertion as discussed above, even to some degree before he had COVID-19 a few weeks ago.  ER workup reassuring overall.  Plan to obtain a follow-up echocardiogram to ensure no change in LVEF and also a Lexiscan Myoview to reassess ischemic burden.  Continue aspirin, Lipitor, Toprol-XL, and as needed nitroglycerin.  2.  Essential hypertension.  No change in present regimen.  3.  Mixed hyperlipidemia.  LDL 90 in January.  Disposition:  Follow up  test results.  Signed, Jonelle Sidle, M.D., F.A.C.C. [facility] at [facility]

## 2023-02-02 ENCOUNTER — Encounter: Payer: Self-pay | Admitting: Cardiology

## 2023-02-02 ENCOUNTER — Ambulatory Visit: Payer: 59 | Attending: Cardiology | Admitting: Cardiology

## 2023-02-02 VITALS — BP 136/80 | HR 64 | Ht 72.0 in | Wt 243.2 lb

## 2023-02-02 DIAGNOSIS — I10 Essential (primary) hypertension: Secondary | ICD-10-CM | POA: Diagnosis not present

## 2023-02-02 DIAGNOSIS — E782 Mixed hyperlipidemia: Secondary | ICD-10-CM

## 2023-02-02 DIAGNOSIS — I25119 Atherosclerotic heart disease of native coronary artery with unspecified angina pectoris: Secondary | ICD-10-CM | POA: Diagnosis not present

## 2023-02-02 DIAGNOSIS — R0602 Shortness of breath: Secondary | ICD-10-CM | POA: Diagnosis not present

## 2023-02-02 NOTE — Patient Instructions (Signed)
Medication Instructions:   Your physician recommends that you continue on your current medications as directed. Please refer to the Current Medication list given to you today.   Labwork: None today  Testing/Procedures: Your physician recommends that you continue on your current medications as directed. Please refer to the Current Medication list given to you today.   Your physician has requested that you have an echocardiogram. Echocardiography is a painless test that uses sound waves to create images of your heart. It provides your doctor with information about the size and shape of your heart and how well your heart's chambers and valves are working. This procedure takes approximately one hour. There are no restrictions for this procedure. Please do NOT wear cologne, perfume, aftershave, or lotions (deodorant is allowed). Please arrive 15 minutes prior to your appointment time.   Follow-Up: To be determined after testing  Any Other Special Instructions Will Be Listed Below (If Applicable).  If you need a refill on your cardiac medications before your next appointment, please call your pharmacy.

## 2023-02-07 ENCOUNTER — Telehealth: Payer: Self-pay | Admitting: Internal Medicine

## 2023-02-07 MED ORDER — ALBUTEROL SULFATE HFA 108 (90 BASE) MCG/ACT IN AERS: 2.0000 | INHALATION_SPRAY | Freq: Four times a day (QID) | RESPIRATORY_TRACT | 0 refills | Status: DC | PRN

## 2023-02-07 NOTE — Telephone Encounter (Signed)
Patient spouse called asking to speak to nurse patient had COVID 3 day ago, patient is better but still having breathing issues needs some advice. Teams message sent Urgent! I have have Justin Villa MRN [id number] (dixon patient )spouse holding line concern patient had covid 3 days ago he is btter but having breathing issues, asking speak to nurse park on [phone]

## 2023-02-14 ENCOUNTER — Telehealth: Payer: Self-pay | Admitting: Orthopedic Surgery

## 2023-02-14 DIAGNOSIS — G8929 Other chronic pain: Secondary | ICD-10-CM

## 2023-02-14 DIAGNOSIS — M75101 Unspecified rotator cuff tear or rupture of right shoulder, not specified as traumatic: Secondary | ICD-10-CM

## 2023-02-14 NOTE — Telephone Encounter (Addendum)
Yes he needs MRI looks like it was approved authorization ended end of July he never scheduled.  We will have to order MRI again  I asked him to call [facility] imaging to schedule and put in order again.

## 2023-02-14 NOTE — Telephone Encounter (Signed)
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  He is thinking that when he last saw Dr. [name] that he mentioned him getting a MRI first.  Please advise.  [phone]

## 2023-02-24 ENCOUNTER — Other Ambulatory Visit: Payer: Self-pay | Admitting: Gastroenterology

## 2023-02-28 ENCOUNTER — Encounter: Payer: Self-pay | Admitting: Orthopedic Surgery

## 2023-03-01 ENCOUNTER — Encounter: Payer: Self-pay | Admitting: Orthopedic Surgery

## 2023-03-01 ENCOUNTER — Telehealth: Payer: Self-pay | Admitting: Orthopedic Surgery

## 2023-03-01 NOTE — Telephone Encounter (Signed)
Dr. Lucila Maine from [facility]  She is calling on a patient Justin Villa DOB 12/19/1965. There is no precert on file for his MRI R Shoulder w/o contrast.  If there is no precert by 10:00 on 03/03/23 they will cancel the MRI.    You can call her back at [phone]

## 2023-03-01 NOTE — Telephone Encounter (Signed)
I submitted everything today. They needed records.

## 2023-03-02 ENCOUNTER — Encounter: Payer: Self-pay | Admitting: Orthopedic Surgery

## 2023-03-02 ENCOUNTER — Telehealth: Payer: Self-pay | Admitting: Orthopedic Surgery

## 2023-03-02 NOTE — Telephone Encounter (Signed)
Dr. Mort Sawyers pt Justin Villa w/[facility] [phone] lvm stating the pt's MRI has not been approved, the insurance is requesting additional information.  She states this needs to be done by 10am tomorrow or they'll have to cancel the MRI.

## 2023-03-02 NOTE — Telephone Encounter (Signed)
Ok I emailed Pam about that yesterday, he likely will not be approved since he has not had PT if they deny I will call him and let him know we are sending for therapy, if they approve we can RS

## 2023-03-03 ENCOUNTER — Encounter: Payer: Self-pay | Admitting: Orthopedic Surgery

## 2023-03-04 ENCOUNTER — Other Ambulatory Visit: Payer: 59

## 2023-03-06 ENCOUNTER — Telehealth (HOSPITAL_COMMUNITY): Payer: Self-pay | Admitting: Surgery

## 2023-03-06 NOTE — Telephone Encounter (Signed)
I called the pt to remind him of his stress tomorrow at 8 am.  The pt was instructed to not eat or drink 6 hours prior to the test (no caffeine or chocolate), to not smoke 8 hours prior to the test, and to dress comfortable and not wear any lotions or oils.  He verbalized understanding of these instructions.

## 2023-03-07 ENCOUNTER — Ambulatory Visit (HOSPITAL_COMMUNITY)
Admission: RE | Admit: 2023-03-07 | Discharge: 2023-03-07 | Disposition: A | Discharge status: Home / Self Care | Payer: 59 | Source: Ambulatory Visit | Attending: Cardiology | Admitting: Cardiology

## 2023-03-07 ENCOUNTER — Encounter (HOSPITAL_BASED_OUTPATIENT_CLINIC_OR_DEPARTMENT_OTHER)
Admission: RE | Admit: 2023-03-07 | Discharge: 2023-03-07 | Disposition: A | Discharge status: Home / Self Care | Payer: 59 | Source: Ambulatory Visit | Attending: Cardiology | Admitting: Cardiology

## 2023-03-07 ENCOUNTER — Encounter (HOSPITAL_COMMUNITY): Payer: Self-pay

## 2023-03-07 DIAGNOSIS — I25119 Atherosclerotic heart disease of native coronary artery with unspecified angina pectoris: Secondary | ICD-10-CM

## 2023-03-07 DIAGNOSIS — R0602 Shortness of breath: Secondary | ICD-10-CM | POA: Diagnosis not present

## 2023-03-07 LAB — NM MYOCAR MULTI W/SPECT W/WALL MOTION / EF
LV dias vol: 97 mL (ref 62–150)
LV sys vol: 41 mL
Nuc Stress EF: 58 %
Peak HR: 71 {beats}/min
RATE: 0.4
Rest HR: 52 {beats}/min
Rest Nuclear Isotope Dose: 11 mCi
SDS: 3
SRS: 1
SSS: 4
ST Depression (mm): 0 mm
Stress Nuclear Isotope Dose: 32 mCi
TID: 0.97

## 2023-03-07 LAB — ECHOCARDIOGRAM COMPLETE
Area-P 1/2: 3.17 cm2
Calc EF: 64 %
S' Lateral: 3.1 cm
Single Plane A2C EF: 74.5 %
Single Plane A4C EF: 57.7 %

## 2023-03-07 MED ORDER — TECHNETIUM TC 99M TETROFOSMIN IV KIT
10.0000 | PACK | Freq: Once | INTRAVENOUS | Status: AC | PRN
  Administered 2023-03-07: 11 via INTRAVENOUS

## 2023-03-07 MED ORDER — TECHNETIUM TC 99M TETROFOSMIN IV KIT
30.0000 | PACK | Freq: Once | INTRAVENOUS | Status: AC | PRN
  Administered 2023-03-07: 32 via INTRAVENOUS

## 2023-03-07 MED ORDER — REGADENOSON 0.4 MG/5ML IV SOLN
INTRAVENOUS | Status: AC
  Administered 2023-03-07: 0.4 mg via INTRAVENOUS
  Filled 2023-03-07: qty 5

## 2023-03-07 MED ORDER — SODIUM CHLORIDE FLUSH 0.9 % IV SOLN
INTRAVENOUS | Status: AC
  Administered 2023-03-07: 10 mL via INTRAVENOUS
  Filled 2023-03-07: qty 10

## 2023-03-07 NOTE — Progress Notes (Signed)
  Echocardiogram 2D Echocardiogram has been performed.  Justin Villa 03/07/2023, 10:27 AM

## 2023-03-08 DIAGNOSIS — D1801 Hemangioma of skin and subcutaneous tissue: Secondary | ICD-10-CM | POA: Diagnosis not present

## 2023-03-08 DIAGNOSIS — L538 Other specified erythematous conditions: Secondary | ICD-10-CM | POA: Diagnosis not present

## 2023-03-13 ENCOUNTER — Telehealth: Payer: Self-pay | Admitting: Orthopedic Surgery

## 2023-03-13 NOTE — Telephone Encounter (Addendum)
MRI Denied, no xray no PT   Will call him to advise.

## 2023-03-14 NOTE — Telephone Encounter (Signed)
I spoke to patient he states he will go to ER tell them he had injury and they will MR it. He declines offer of physical therapy, states if he changes his mind he will let me know.

## 2023-03-14 NOTE — Telephone Encounter (Signed)
I called patient to advise of MRI denial and to see if he wants to proceed with physical theray. No answer. Left message for him to call back.

## 2023-03-14 NOTE — Telephone Encounter (Signed)
Dr. Mort Sawyers pt - pt lvm stating he was returning a call.

## 2023-03-25 ENCOUNTER — Other Ambulatory Visit: Payer: Self-pay | Admitting: Cardiology

## 2023-04-10 ENCOUNTER — Encounter: Payer: Self-pay | Admitting: Internal Medicine

## 2023-04-10 ENCOUNTER — Ambulatory Visit (INDEPENDENT_AMBULATORY_CARE_PROVIDER_SITE_OTHER): Payer: 59 | Admitting: Internal Medicine

## 2023-04-10 VITALS — BP 118/76 | HR 80 | Resp 16 | Ht 72.0 in | Wt 252.6 lb

## 2023-04-10 DIAGNOSIS — Z9189 Other specified personal risk factors, not elsewhere classified: Secondary | ICD-10-CM

## 2023-04-10 DIAGNOSIS — I2510 Atherosclerotic heart disease of native coronary artery without angina pectoris: Secondary | ICD-10-CM

## 2023-04-10 DIAGNOSIS — K2270 Barrett's esophagus without dysplasia: Secondary | ICD-10-CM

## 2023-04-10 DIAGNOSIS — I10 Essential (primary) hypertension: Secondary | ICD-10-CM

## 2023-04-10 NOTE — Assessment & Plan Note (Signed)
History of CABG x 5 in June 2017.  Recently evaluated by cardiology (Dr. Diona Browner) for follow-up.  Underwent Lexi scan in the setting of dyspnea on exertion that was reassuring.  He remains on ASA 81 mg daily, atorvastatin 20 mg daily, and Toprol-XL 25 mg daily.

## 2023-04-10 NOTE — Progress Notes (Signed)
Established Patient Office Visit  Subjective   Patient ID: Justin Villa, male    DOB: 1965/10/17  Age: 57 y.o. MRN: [id number]  Chief Complaint  Patient presents with   Hypertension    6 month follow up visit. Wants to discuss labs    Mr. Pagliarulo returns to care today for routine follow-up.  He was last evaluated by me on 5/3.  No medication changes were made at that time and referrals were placed to dermatology and pulmonology for evaluation of a facial lesion and OSA respectively.  In the interim, he has been evaluated by pulmonology but canceled his appointment for a split-night study.  He underwent left carpal tunnel release with Dr. Romeo Apple on 5/15.  ED presentation in August for COVID-19.  He has also been evaluated by cardiology and underwent Lexi scan, which was reassuring.  There have otherwise been no acute interval events.  Mr. Gentle reports feeling well today.  He is asymptomatic and has no acute concerns to discuss.  Past Medical History:  Diagnosis Date   CAD (coronary artery disease) 11/27/2015   a. S/p NSTEMI 6/17 >> s/p CABG // b. LHC 6/17: oLAD 50, mLAD 100, D1 70, D2 95, oLCx 95, mLCx 65, OM1 85, OM2 90, mRCA 85, dRCA 75, RPDA 40/50, RPLB2 50    Carotid stenosis    a. Carotid US 6/17: bilat ICA 1-39%   Essential hypertension    GERD (gastroesophageal reflux disease)    History of non-ST elevation myocardial infarction (NSTEMI) 11/2015   Hyperlipidemia    Stroke [facility])    Past Surgical History:  Procedure Laterality Date   BACK SURGERY     BIOPSY  08/25/2022   Procedure: BIOPSY;  Surgeon: Lanelle Bal, DO;  Location: [facility];  Service: Endoscopy;;   CARDIAC CATHETERIZATION N/A 11/26/2015   Procedure: Left Heart Cath and Coronary Angiography;  Surgeon: Lyn Records, MD;  Location: [facility] INVASIVE CV LAB;  Service: Cardiovascular;  Laterality: N/A;   CARPAL TUNNEL RELEASE Left 10/19/2022   Procedure: CARPAL TUNNEL RELEASE;  Surgeon: Vickki Hearing,  MD;  Location: [facility] ORS;  Service: Orthopedics;  Laterality: Left;   COLONOSCOPY WITH PROPOFOL N/A 08/25/2022   Procedure: COLONOSCOPY WITH PROPOFOL;  Surgeon: Lanelle Bal, DO;  Location: [facility];  Service: Endoscopy;  Laterality: N/A;  10:30am, asa 3   CORONARY ARTERY BYPASS GRAFT N/A 11/27/2015   Procedure: CORONARY ARTERY BYPASS GRAFTING (CABG) x 5 (LIMA to LAD, SVG to DIAGONAL, SVG SEQUENTIALLY to OM1 and OM2, SVG to PDA) with EVH from right leg using greater saphenous vein and mammary.;  Surgeon: Delight Ovens, MD;  Location: [facility] OR;  Service: Open Heart Surgery;  Laterality: N/A;   ESOPHAGOGASTRODUODENOSCOPY (EGD) WITH PROPOFOL N/A 08/25/2022   Procedure: ESOPHAGOGASTRODUODENOSCOPY (EGD) WITH PROPOFOL;  Surgeon: Lanelle Bal, DO;  Location: [facility];  Service: Endoscopy;  Laterality: N/A;   INCISION AND DRAINAGE ABSCESS Right 06/16/2020   Procedure: INCISION AND DRAINAGE ABSCESS RIGHT PINKY TOE (5th toe);  Surgeon: Vickki Hearing, MD;  Location: [facility];  Service: Orthopedics;  Laterality: Right;   INTRAOPERATIVE TRANSESOPHAGEAL ECHOCARDIOGRAM N/A 11/27/2015   Procedure: INTRAOPERATIVE TRANSESOPHAGEAL ECHOCARDIOGRAM;  Surgeon: Delight Ovens, MD;  Location: [facility] OR;  Service: Open Heart Surgery;  Laterality: N/A;   KNEE SURGERY Left    arthroscopy   POLYPECTOMY  08/25/2022   Procedure: POLYPECTOMY;  Surgeon: Lanelle Bal, DO;  Location: [facility];  Service: Endoscopy;;  Social History   Tobacco Use   Smoking status: Never   Smokeless tobacco: Never  Vaping Use   Vaping status: Never Used  Substance Use Topics   Alcohol use: Yes    Alcohol/week: 2.0 standard drinks of alcohol    Types: 2 Shots of liquor per week    Comment: everyother day   Drug use: No   Family History  Problem Relation Age of Onset   Heart disease Father    No Known Allergies  Review of Systems  Constitutional:  Negative for chills and fever.  HENT:  Negative for sore  throat.   Respiratory:  Negative for cough and shortness of breath.   Cardiovascular:  Negative for chest pain, palpitations and leg swelling.  Gastrointestinal:  Negative for abdominal pain, blood in stool, constipation, diarrhea, nausea and vomiting.  Genitourinary:  Negative for dysuria and hematuria.  Musculoskeletal:  Negative for myalgias.  Skin:  Negative for itching and rash.  Neurological:  Negative for dizziness and headaches.  Psychiatric/Behavioral:  Negative for depression and suicidal ideas.      Objective:     BP 118/76   Pulse 80   Resp 16   Ht 6' (1.829 m)   Wt 252 lb 9.6 oz (114.6 kg)   SpO2 95%   BMI 34.26 kg/m  BP Readings from Last 3 Encounters:  04/10/23 118/76  02/02/23 136/80  01/29/23 (!) 154/104   Physical Exam Vitals reviewed.  Constitutional:      General: He is not in acute distress.    Appearance: Normal appearance. He is obese. He is not ill-appearing.  HENT:     Head: Normocephalic and atraumatic.     Right Ear: External ear normal.     Left Ear: External ear normal.     Nose: Nose normal. No congestion or rhinorrhea.     Mouth/Throat:     Mouth: Mucous membranes are moist.     Pharynx: Oropharynx is clear.  Eyes:     General: No scleral icterus.    Extraocular Movements: Extraocular movements intact.     Conjunctiva/sclera: Conjunctivae normal.     Pupils: Pupils are equal, round, and reactive to light.  Cardiovascular:     Rate and Rhythm: Normal rate and regular rhythm.     Pulses: Normal pulses.     Heart sounds: Normal heart sounds. No murmur heard. Pulmonary:     Effort: Pulmonary effort is normal.     Breath sounds: Normal breath sounds. No wheezing, rhonchi or rales.  Abdominal:     General: Abdomen is flat. Bowel sounds are normal. There is no distension.     Palpations: Abdomen is soft.     Tenderness: There is no abdominal tenderness.  Musculoskeletal:        General: No swelling or deformity. Normal range of motion.      Cervical back: Normal range of motion.  Skin:    General: Skin is warm and dry.     Capillary Refill: Capillary refill takes less than 2 seconds.  Neurological:     General: No focal deficit present.     Mental Status: He is alert and oriented to person, place, and time.     Motor: No weakness.  Psychiatric:        Mood and Affect: Mood normal.        Behavior: Behavior normal.        Thought Content: Thought content normal.   Last CBC Lab Results  Component Value  Date   WBC 7.2 01/29/2023   HGB 15.5 01/29/2023   HCT 45.7 01/29/2023   MCV 83.7 01/29/2023   MCH 28.4 01/29/2023   RDW 14.7 01/29/2023   PLT 319 01/29/2023   Last metabolic panel Lab Results  Component Value Date   GLUCOSE 161 (H) 01/29/2023   NA 136 01/29/2023   K 3.8 01/29/2023   CL 104 01/29/2023   CO2 22 01/29/2023   BUN 14 01/29/2023   CREATININE 1.07 01/29/2023   GFRNONAA >60 01/29/2023   CALCIUM 8.4 (L) 01/29/2023   PROT 6.6 01/29/2023   ALBUMIN 4.1 01/29/2023   LABGLOB 2.5 06/17/2022   AGRATIO 1.7 06/17/2022   BILITOT 1.0 01/29/2023   ALKPHOS 52 01/29/2023   AST 24 01/29/2023   ALT 27 01/29/2023   ANIONGAP 10 01/29/2023   Last lipids Lab Results  Component Value Date   CHOL 156 06/17/2022   HDL 33 (L) 06/17/2022   LDLCALC 90 06/17/2022   TRIG 190 (H) 06/17/2022   CHOLHDL 4.7 06/17/2022   Last hemoglobin A1c Lab Results  Component Value Date   HGBA1C 5.7 (H) 06/17/2022   Last thyroid functions Lab Results  Component Value Date   TSH 2.190 06/17/2022   Last vitamin D Lab Results  Component Value Date   VD25OH 26.8 (L) 06/17/2022   Last vitamin B12 and Folate Lab Results  Component Value Date   VITAMINB12 352 06/17/2022   FOLATE 5.1 06/17/2022     Assessment & Plan:   Problem List Items Addressed This Visit       CAD (coronary artery disease)    History of CABG x 5 in June 2017.  Recently evaluated by cardiology (Dr. Diona Browner) for follow-up.  Underwent Lexi scan  in the setting of dyspnea on exertion that was reassuring.  He remains on ASA 81 mg daily, atorvastatin 20 mg daily, and Toprol-XL 25 mg daily.      HTN (hypertension) - Primary    Remains adequately controlled on current antihypertensive regimen.  No medication changes are indicated today.      Barrett's esophagus    EGD completed in March 2024.  Symptoms remain well-controlled with Protonix 40 mg twice daily.  Repeat EGD recommended for 3-5 years.      Risk factors for obstructive sleep apnea    Previously referred to sleep medicine for OSA evaluation.  Split-night sleep study recommended, however he canceled this appointment because he is not interested in wearing a mask.  I recommended that he schedule follow-up with sleep medicine to discuss his concerns.  We discussed the potential consequences of untreated OSA.      Hypocalcemia    Corrected calcium 8.3 on labs from August.  This does not appear to be a new issue.  He is asymptomatic currently.  We discussed repeating labs today for further workup, however he would like to defer this until follow-up in 6 months.       Return in about 6 months (around 10/08/2023).    Billie Lade, MD

## 2023-04-10 NOTE — Assessment & Plan Note (Signed)
EGD completed in March 2024.  Symptoms remain well-controlled with Protonix 40 mg twice daily.  Repeat EGD recommended for 3-5 years.

## 2023-04-10 NOTE — Assessment & Plan Note (Signed)
Corrected calcium 8.3 on labs from August.  This does not appear to be a new issue.  He is asymptomatic currently.  We discussed repeating labs today for further workup, however he would like to defer this until follow-up in 6 months.

## 2023-04-10 NOTE — Patient Instructions (Signed)
It was a pleasure to see you today.  Thank you for giving [place] the opportunity to be involved in your care.  Below is a brief recap of your visit and next steps.  We will plan to see you again in 6 months.  Summary No medication changes today We will plan for follow up in 6 months and plan to repeat labs at that time I recommend scheduling follow up with sleep medicine as we discussed

## 2023-04-10 NOTE — Assessment & Plan Note (Signed)
Previously referred to sleep medicine for OSA evaluation.  Split-night sleep study recommended, however he canceled this appointment because he is not interested in wearing a mask.  I recommended that he schedule follow-up with sleep medicine to discuss his concerns.  We discussed the potential consequences of untreated OSA.

## 2023-04-10 NOTE — Assessment & Plan Note (Signed)
 Remains adequately controlled on current antihypertensive regimen.  No medication changes are indicated today.

## 2023-05-08 ENCOUNTER — Ambulatory Visit: Payer: 59 | Admitting: Orthopedic Surgery

## 2023-05-08 ENCOUNTER — Other Ambulatory Visit (INDEPENDENT_AMBULATORY_CARE_PROVIDER_SITE_OTHER): Payer: Self-pay

## 2023-05-08 ENCOUNTER — Other Ambulatory Visit (INDEPENDENT_AMBULATORY_CARE_PROVIDER_SITE_OTHER): Payer: 59

## 2023-05-08 DIAGNOSIS — M79671 Pain in right foot: Secondary | ICD-10-CM | POA: Diagnosis not present

## 2023-05-08 DIAGNOSIS — G8929 Other chronic pain: Secondary | ICD-10-CM

## 2023-05-08 DIAGNOSIS — M25561 Pain in right knee: Secondary | ICD-10-CM | POA: Diagnosis not present

## 2023-05-08 DIAGNOSIS — M170 Bilateral primary osteoarthritis of knee: Secondary | ICD-10-CM

## 2023-05-08 MED ORDER — CIPROFLOXACIN HCL 500 MG PO TABS: 500.0000 mg | ORAL_TABLET | Freq: Two times a day (BID) | ORAL | 1 refills | Status: DC

## 2023-05-08 NOTE — Patient Instructions (Signed)
Central scheduling [phone] 

## 2023-05-08 NOTE — Progress Notes (Signed)
Chief Complaint  Patient presents with   Foot Pain    Blister on right foot with pain and pain in the right knee     Justin Villa is 57 years old he has a blister on his right foot over the area where he had a previous incision and drainage which grew back Pseudomonas back in January 2022.  He developed a blister over the same area and pain over the plantar aspect of the right foot under the fifth metatarsal head  There is no erythema around the area it is just tender the incision looks good.  There is an area which perhaps was a blister but is now defervesced there is no open wound or drainage  Imaging shows deformity of the distal fifth metatarsal head and degenerative arthritis of the joint and some cystic changes of the bone  This is worrisome for possible osteomyelitis  Recommend CBC sed rate C-reactive protein start antibiotics  His right knee is also bothering him he has had several aspirations and injections but no formal workup.  We took x-rays of his knee today he has end-stage arthritis of both knees and both knees need replacement  However, as I discussed with him first we have to find out if this area in his right foot is infected because that will require treatment first  Meds ordered this encounter  Medications   ciprofloxacin (CIPRO) 500 MG tablet    Sig: Take 1 tablet (500 mg total) by mouth 2 (two) times daily.    Dispense:  20 tablet    Refill:  1

## 2023-05-09 LAB — CBC WITH DIFFERENTIAL/PLATELET
Absolute Lymphocytes: 2323 {cells}/uL (ref 850–3900)
Absolute Monocytes: 845 {cells}/uL (ref 200–950)
Basophils Absolute: 79 {cells}/uL (ref 0–200)
Basophils Relative: 1 %
Eosinophils Absolute: 324 {cells}/uL (ref 15–500)
Eosinophils Relative: 4.1 %
HCT: 51.1 % — ABNORMAL HIGH (ref 38.5–50.0)
Hemoglobin: 16.9 g/dL (ref 13.2–17.1)
MCH: 28.6 pg (ref 27.0–33.0)
MCHC: 33.1 g/dL (ref 32.0–36.0)
MCV: 86.5 fL (ref 80.0–100.0)
MPV: 10.6 fL (ref 7.5–12.5)
Monocytes Relative: 10.7 %
Neutro Abs: 4329 {cells}/uL (ref 1500–7800)
Neutrophils Relative %: 54.8 %
Platelets: 306 10*3/uL (ref 140–400)
RBC: 5.91 10*6/uL — ABNORMAL HIGH (ref 4.20–5.80)
RDW: 15.1 % — ABNORMAL HIGH (ref 11.0–15.0)
Total Lymphocyte: 29.4 %
WBC: 7.9 10*3/uL (ref 3.8–10.8)

## 2023-05-09 LAB — C-REACTIVE PROTEIN: CRP: <3 mg/L (ref <8.0)

## 2023-05-09 LAB — SEDIMENTATION RATE: Sed Rate: 2 mm/h (ref 0–20)

## 2023-05-22 ENCOUNTER — Ambulatory Visit (HOSPITAL_COMMUNITY): Payer: 59

## 2023-05-26 ENCOUNTER — Telehealth: Payer: Self-pay

## 2023-05-26 NOTE — Telephone Encounter (Signed)
Justin Villa called to find out if patient's MRI had been authorized and where he needed to go.  I told her what I read from Leta's note and suggested her to call [facility] and get the MRI setup.

## 2023-06-15 NOTE — Progress Notes (Deleted)
 Cardiology Office Note    Date:  06/15/2023  ID:  TREQUAN MARSOLEK, DOB 1966-03-11, MRN [id number] Cardiologist: Jayson Sierras, MD    History of Present Illness:    Justin Villa is a 58 y.o. male with past medical history of CAD (s/p CABG in 11/2015 with LIMA-LAD, SVG-Diag, SVG-OM1-LCx and SVG-PDA, low-risk NST in 2021), HTN, HLD and carotid artery stenosis who presents to the office today for 31-month follow-up.  He was last examined by Dr. Sierras in 01/2023 and had recently been diagnosed with COVID-19 approximately 2 weeks prior to his office visit which had led to ED evaluation. Reported that prior to having COVID-19, he had still been experiencing worsening dyspnea on exertion but no specific chest pain. A follow-up echocardiogram and Lexiscan  Myoview  were recommended for further assessment and he was continued on his current cardiac medications with ASA 81 mg daily, Atorvastatin  20 mg daily, Lisinopril  40 mg daily, Toprol -XL 25 mg daily and PRN SL NTG.   His echocardiogram showed a preserved EF of 60 to 65% with no regional wall motion abnormalities.  He did have moderate LVH, grade 2 diastolic dysfunction, mildly reduced RV function, no significant valve abnormalities and mild dilatation of the ascending aorta at 38 mm. NST showed soft tissue attenuation but no clear ischemia and was overall a low-risk study.  -Titrate statin? -Carotid Dopplers?  ROS: ***  Studies Reviewed:   EKG: EKG is*** ordered today and demonstrates ***   EKG Interpretation Date/Time:    Ventricular Rate:    PR Interval:    QRS Duration:    QT Interval:    QTC Calculation:   R Axis:      Text Interpretation:         Echocardiogram: 03/2023 IMPRESSIONS     1. Left ventricular ejection fraction, by estimation, is 60 to 65%. The  left ventricle has normal function. The left ventricle has no regional  wall motion abnormalities. There is moderate concentric left ventricular  hypertrophy. Left  ventricular  diastolic parameters are consistent with Grade II diastolic dysfunction  (pseudonormalization). The average left ventricular global longitudinal  strain is -22.1 %. The global longitudinal strain is normal.   2. Right ventricular systolic function is mildly reduced. The right  ventricular size is normal. Tricuspid regurgitation signal is inadequate  for assessing PA pressure.   3. The mitral valve is grossly normal, mildly calcified. Trivial mitral  valve regurgitation.   4. The aortic valve is tricuspid. There is mild calcification of the  aortic valve. Aortic valve regurgitation is not visualized. No aortic  stenosis is present.   5. Aortic dilatation noted. There is mild dilatation of the ascending  aorta, measuring 38 mm.   6. The inferior vena cava is normal in size with greater than 50%  respiratory variability, suggesting right atrial pressure of 3 mmHg.   Comparison(s): Prior images unable to be directly viewed.   NST: 03/2023   Findings are consistent with no ischemia. The study is low risk.   No ST deviation was noted. The ECG was negative for ischemia.   LV perfusion is normal.  Small, mild intensity, fixed apical defect most consistent with soft tissue attenuation given normal wall motion on gated imaging.  No clear ischemic territories.   Left ventricular function is normal. Nuclear stress EF: 58%. End diastolic cavity size is normal. End systolic cavity size is normal.   Low risk study with soft tissue attenuation affecting the apex, no definite ischemia, LVEF  58%.   Risk Assessment/Calculations:   {Does this patient have ATRIAL FIBRILLATION?:[id number]} No BP recorded.  {Refresh Note OR Click here to enter BP  :1}***         Physical Exam:   VS:  There were no vitals taken for this visit.   Wt Readings from Last 3 Encounters:  04/10/23 252 lb 9.6 oz (114.6 kg)  02/02/23 243 lb 3.2 oz (110.3 kg)  01/29/23 235 lb (106.6 kg)     GEN: Well  nourished, well developed in no acute distress NECK: No JVD; No carotid bruits CARDIAC: ***RRR, no murmurs, rubs, gallops RESPIRATORY:  Clear to auscultation without rales, wheezing or rhonchi  ABDOMEN: Appears non-distended. No obvious abdominal masses. EXTREMITIES: No clubbing or cyanosis. No edema.  Distal pedal pulses are 2+ bilaterally.   Assessment and Plan:   1. CAD - He underwent CABG in 11/2015 with LIMA-LAD, SVG-Diag, SVG-OM1-LCx and SVG-PDA and recent NST in 03/2023 showed no evidence of ischemia as discussed above.  ***  2. HTN - ***  3. HLD - FLP in 06/2022 showed total cholesterol 156, triglycerides 190, HDL 33 and LDL 90.  4. Carotid Artery Stenosis - Prior dopplers in 2017 prior to CABG showed 1 to 39% plaque bilaterally.    Signed, Justin CHRISTELLA Qua, PA-C

## 2023-06-16 ENCOUNTER — Ambulatory Visit: Payer: 59 | Admitting: Student

## 2023-06-16 ENCOUNTER — Ambulatory Visit: Payer: Self-pay | Admitting: Student

## 2023-06-20 ENCOUNTER — Telehealth: Payer: Self-pay | Admitting: Orthopedic Surgery

## 2023-06-20 NOTE — Telephone Encounter (Signed)
 Dr. Mort Sawyers pt - spoke w/Alicia w/[facility] [phone] ext [phone], she stated that the pt is scheduled for a MRI of his rt foot and they are not in network w/his insurance.  They can either cx this appt or he can be self pay.

## 2023-06-21 NOTE — Telephone Encounter (Signed)
 Insurance changed was [employer] not its [employer] will need to do another authorization

## 2023-06-21 NOTE — Telephone Encounter (Signed)
 We do take [employer], it's a form of Medicaid

## 2023-06-21 NOTE — Telephone Encounter (Signed)
 I thought he was self pay Do we take it? Looks like [employer]

## 2023-06-22 ENCOUNTER — Telehealth: Payer: Self-pay | Admitting: Orthopedic Surgery

## 2023-06-22 NOTE — Telephone Encounter (Signed)
I can't pull up hs insurance on [employer] I need his card, called him to let him know / he will bring card after lunch

## 2023-06-29 ENCOUNTER — Other Ambulatory Visit: Payer: Self-pay

## 2023-07-05 ENCOUNTER — Other Ambulatory Visit: Payer: Self-pay | Admitting: Gastroenterology

## 2023-07-05 NOTE — Telephone Encounter (Signed)
Mandy please schedule this pt for an ov before refills can be given

## 2023-07-05 NOTE — Telephone Encounter (Signed)
Appointment has been made

## 2023-07-05 NOTE — Telephone Encounter (Signed)
Pt needs ov before anymore refills

## 2023-07-06 ENCOUNTER — Ambulatory Visit (INDEPENDENT_AMBULATORY_CARE_PROVIDER_SITE_OTHER): Payer: No Typology Code available for payment source | Admitting: Gastroenterology

## 2023-07-06 ENCOUNTER — Encounter: Payer: Self-pay | Admitting: Gastroenterology

## 2023-07-06 VITALS — BP 138/90 | HR 67 | Temp 98.0°F | Ht 72.0 in | Wt 250.6 lb

## 2023-07-06 DIAGNOSIS — Z8719 Personal history of other diseases of the digestive system: Secondary | ICD-10-CM | POA: Diagnosis not present

## 2023-07-06 DIAGNOSIS — K2270 Barrett's esophagus without dysplasia: Secondary | ICD-10-CM | POA: Diagnosis not present

## 2023-07-06 DIAGNOSIS — K219 Gastro-esophageal reflux disease without esophagitis: Secondary | ICD-10-CM | POA: Diagnosis not present

## 2023-07-06 DIAGNOSIS — Z860100 Personal history of colon polyps, unspecified: Secondary | ICD-10-CM | POA: Diagnosis not present

## 2023-07-06 NOTE — Progress Notes (Signed)
Gastroenterology Office Note     Primary Care Physician:  Billie Lade, MD  Primary Gastroenterologist: Dr. Marletta Lor   Chief Complaint   Chief Complaint  Patient presents with   Gastroesophageal Reflux    Follow up on GERD. Takes protonix once a day instead of twice daily and also famotidine once a day instead of twice. States doing well taking meds once a day      History of Present Illness   Justin Villa is a 58 y.o. male presenting today with a history of Barrett's esophagus diagnosed in March 2024, presenting for routine yearly visit.   No abdominal pain, N/V, changes in bowel habits, constipation, diarrhea, overt GI bleeding, uncontrolled GERD, dysphagia, unexplained weight loss, lack of appetite, unexplained weight gain.   He is taking pantoprazole once a day and famotidine once a day. He also has documented fatty liver on past imaging, but he is declining further intensive work-up. LFTs normal.    EGD March 2024: suspect short segment Barrett's s/p biopsy, gastritis s/p biopsy, normal duodenum. Path with Barrett's. Surveillance in 3 years.   Colonoscopy March 2024: diverticulosis, one 2 mm polyp in cecum, one 3 mm polyp in sigmoid. Tubular adenomas. 7 year surveillance   Past Medical History:  Diagnosis Date   CAD (coronary artery disease) 11/27/2015   a. S/p NSTEMI 6/17 >> s/p CABG // b. LHC 6/17: oLAD 50, mLAD 100, D1 70, D2 95, oLCx 95, mLCx 65, OM1 85, OM2 90, mRCA 85, dRCA 75, RPDA 40/50, RPLB2 50    Carotid stenosis    a. Carotid US 6/17: bilat ICA 1-39%   Essential hypertension    GERD (gastroesophageal reflux disease)    History of non-ST elevation myocardial infarction (NSTEMI) 11/2015   Hyperlipidemia    Stroke [facility])     Past Surgical History:  Procedure Laterality Date   BACK SURGERY     BIOPSY  08/25/2022   Procedure: BIOPSY;  Surgeon: Lanelle Bal, DO;  Location: [facility];  Service: Endoscopy;;   CARDIAC CATHETERIZATION N/A  11/26/2015   Procedure: Left Heart Cath and Coronary Angiography;  Surgeon: Lyn Records, MD;  Location[facility] INVASIVE CV LAB;  Service: Cardiovascular;  Laterality: N/A;   CARPAL TUNNEL RELEASE Left 10/19/2022   Procedure: CARPAL TUNNEL RELEASE;  Surgeon: Vickki Hearing, MD;  Location: [facility] ORS;  Service: Orthopedics;  Laterality: Left;   COLONOSCOPY WITH PROPOFOL N/A 08/25/2022   Procedure: COLONOSCOPY WITH PROPOFOL;  Surgeon: Lanelle Bal, DO;  Location: [facility];  Service: Endoscopy;  Laterality: N/A;  10:30am, asa 3   CORONARY ARTERY BYPASS GRAFT N/A 11/27/2015   Procedure: CORONARY ARTERY BYPASS GRAFTING (CABG) x 5 (LIMA to LAD, SVG to DIAGONAL, SVG SEQUENTIALLY to OM1 and OM2, SVG to PDA) with EVH from right leg using greater saphenous vein and mammary.;  Surgeon: Delight Ovens, MD;  Location: [facility] OR;  Service: Open Heart Surgery;  Laterality: N/A;   ESOPHAGOGASTRODUODENOSCOPY (EGD) WITH PROPOFOL N/A 08/25/2022   Procedure: ESOPHAGOGASTRODUODENOSCOPY (EGD) WITH PROPOFOL;  Surgeon: Lanelle Bal, DO;  Location: [facility];  Service: Endoscopy;  Laterality: N/A;   INCISION AND DRAINAGE ABSCESS Right 06/16/2020   Procedure: INCISION AND DRAINAGE ABSCESS RIGHT PINKY TOE (5th toe);  Surgeon: Vickki Hearing, MD;  Location: [facility] ORS;  Service: Orthopedics;  Laterality: Right;   INTRAOPERATIVE TRANSESOPHAGEAL ECHOCARDIOGRAM N/A 11/27/2015   Procedure: INTRAOPERATIVE TRANSESOPHAGEAL ECHOCARDIOGRAM;  Surgeon: Delight Ovens, MD;  Location: [facility] OR;  Service: Open Heart Surgery;  Laterality: N/A;   KNEE SURGERY Left    arthroscopy   POLYPECTOMY  08/25/2022   Procedure: POLYPECTOMY;  Surgeon: Lanelle Bal, DO;  Location: [facility];  Service: Endoscopy;;    Current Outpatient Medications  Medication Sig Dispense Refill   aspirin EC 81 MG EC tablet Take 1 tablet (81 mg total) by mouth daily.     atorvastatin (LIPITOR) 20 MG tablet TAKE 1 TABLET BY MOUTH DAILY 30 tablet  3   Cholecalciferol (VITAMIN D) 50 MCG (2000 UT) CAPS Take 2,000 Units by mouth daily.     famotidine (PEPCID) 20 MG tablet Take 1 tablet (20 mg total) by mouth 2 (two) times daily. 30 tablet 0   lisinopril (ZESTRIL) 40 MG tablet TAKE 1 TABLET BY MOUTH DAILY 30 tablet 3   metoprolol succinate (TOPROL-XL) 25 MG 24 hr tablet TAKE 1 TABLET BY MOUTH DAILY 30 tablet 3   nitroGLYCERIN (NITROSTAT) 0.4 MG SL tablet Place 1 tablet (0.4 mg total) under the tongue every 5 (five) minutes x 3 doses as needed for chest pain (if no relief after 2nd dose, proceed to ED or call 911). 25 tablet 3   pantoprazole (PROTONIX) 40 MG tablet TAKE 1 TABLET BY MOUTH 2 TIMES A DAY BEFORE A MEAL 60 tablet 0   No current facility-administered medications for this visit.    Allergies as of 07/06/2023   (No Known Allergies)    Family History  Problem Relation Age of Onset   Heart disease Father    Colon cancer Neg Hx     Social History   Socioeconomic History   Marital status: Single    Spouse name: Not on file   Number of children: Not on file   Years of education: Not on file   Highest education level: Not on file  Occupational History   Not on file  Tobacco Use   Smoking status: Never   Smokeless tobacco: Never  Vaping Use   Vaping status: Never Used  Substance and Sexual Activity   Alcohol use: Yes    Alcohol/week: 2.0 standard drinks of alcohol    Types: 2 Shots of liquor per week    Comment: everyother day   Drug use: No   Sexual activity: Yes  Other Topics Concern   Not on file  Social History Narrative   Not on file   Social Drivers of Health   Financial Resource Strain: Not on file  Food Insecurity: Not on file  Transportation Needs: Not on file  Physical Activity: Not on file  Stress: Not on file  Social Connections: Not on file  Intimate Partner Violence: Not on file     Review of Systems   Gen: Denies any fever, chills, fatigue, weight loss, lack of appetite.  CV: Denies  chest pain, heart palpitations, peripheral edema, syncope.  Resp: Denies shortness of breath at rest or with exertion. Denies wheezing or cough.  GI: Denies dysphagia or odynophagia. Denies jaundice, hematemesis, fecal incontinence. GU : Denies urinary burning, urinary frequency, urinary hesitancy MS: Denies joint pain, muscle weakness, cramps, or limitation of movement.  Derm: Denies rash, itching, dry skin Psych: Denies depression, anxiety, memory loss, and confusion Heme: Denies bruising, bleeding, and enlarged lymph nodes.   Physical Exam   BP (!) 138/90   Pulse 67   Temp 98 F (36.7 C) (Oral)   Ht 6' (1.829 m)   Wt 250 lb 9.6 oz (113.7 kg)   BMI  33.99 kg/m  General:   Alert and oriented. Pleasant and cooperative. Well-nourished and well-developed.  Head:  Normocephalic and atraumatic. Eyes:  Without icterus Abdomen:  +BS, soft, non-tender and non-distended. No HSM noted. No guarding or rebound. No masses appreciated.  Rectal:  Deferred  Msk:  Symmetrical without gross deformities. Normal posture. Extremities:  Without edema. Neurologic:  Alert and  oriented x4;  grossly normal neurologically. Skin:  Intact without significant lesions or rashes. Psych:  Alert and cooperative. Normal mood and affect.   Assessment   Justin Villa is a 58 y.o. male presenting today with a history of Barrett's esophagus diagnosed in March 2024, presenting for routine yearly visit.   Barrett's: remains on PPI once daily with good GERD control. No alarm signs/symptoms. Next surveillance in 2027.   History of colon polyps: initial screening in 2024. Due to tubular adenomas, will need in 2031.   Hx of fatty liver: declining further evaluation. LFTs normal. Discussed briefly avoidance of alcohol, healthy diet, weight modification.    PLAN    Continue pantoprazole once daily Return in 1 year EGD in 2027, colonoscopy 2031   Justin Mink, PhD, ANP-BC [facility] Gastroenterology

## 2023-07-06 NOTE — Patient Instructions (Signed)
Continue pantoprazole once daily, 30 minutes before breakfast, as it is best absorbed this way. When you need refills, just let us know! It's important to take this every day due to your history of Barrett's esophagus ( I have included a handout).  Your next endoscopy will be around 2027, and your next colonoscopy 2031 unless clinical changes.  We will see you in 1 year!  Happy early birthday!   I enjoyed seeing you again today! I value our relationship and want to provide genuine, compassionate, and quality care. You may receive a survey regarding your visit with me, and I welcome your feedback! Thanks so much for taking the time to complete this. I look forward to seeing you again.      Gelene Mink, PhD, ANP-BC [facility] Gastroenterology

## 2023-07-10 ENCOUNTER — Telehealth: Payer: Self-pay | Admitting: Orthopedic Surgery

## 2023-07-10 NOTE — Telephone Encounter (Signed)
Dr. Mort Sawyers pt - Justin Villa [phone] lvm stating that she was calling for the pt, stated that he brought his new ins card by so we can see if the MRI will be approved.  She stated that they have not heard anything.  She would like a call back.

## 2023-07-10 NOTE — Telephone Encounter (Signed)
I dont know what to do with this one,  His incurance can't be found I called and tried but insurance company couldn't find either  Can you help with this and tell me what to do.   I did create a case and faxed information but havent heard back if he has coverage.

## 2023-07-11 ENCOUNTER — Telehealth: Payer: Self-pay | Admitting: Orthopedic Surgery

## 2023-07-11 NOTE — Telephone Encounter (Signed)
 Dr. Areatha pt - Hadassah Siskin [phone] lvm stating that she was calling for the pt, stated that his knees are getting worse, he is having difficulty getting around.  He would like to have knee replacement.  Wants to know if we can put in a referral to [place] for that or if Dr. Margrette would do the procedure.

## 2023-07-12 NOTE — Telephone Encounter (Signed)
 Can you help me see if Justin Villa is on his HIPAA please, I dont see one in system and she keeps calling We are trying to get MRI of his foot The first auth expired his insurance changed and it took me a while to get the MR approved with is new coverage The approval for it will likely expire too.   But I dont know who she is.

## 2023-07-12 NOTE — Telephone Encounter (Signed)
 Thanks that's what I saw too  I left message for him to call me back

## 2023-07-14 ENCOUNTER — Ambulatory Visit (HOSPITAL_COMMUNITY)
Admission: RE | Admit: 2023-07-14 | Discharge: 2023-07-14 | Disposition: A | Discharge status: Home / Self Care | Payer: No Typology Code available for payment source | Source: Ambulatory Visit | Attending: Orthopedic Surgery | Admitting: Orthopedic Surgery

## 2023-07-14 DIAGNOSIS — M79671 Pain in right foot: Secondary | ICD-10-CM | POA: Diagnosis present

## 2023-07-14 MED ORDER — GADOBUTROL 1 MMOL/ML IV SOLN
10.0000 mL | Freq: Once | INTRAVENOUS | Status: AC | PRN
  Administered 2023-07-14: 10 mL via INTRAVENOUS

## 2023-07-19 ENCOUNTER — Ambulatory Visit: Payer: No Typology Code available for payment source | Attending: Cardiology | Admitting: Cardiology

## 2023-07-19 ENCOUNTER — Encounter: Payer: Self-pay | Admitting: Cardiology

## 2023-07-19 VITALS — BP 140/84 | HR 69 | Ht 72.0 in | Wt 253.6 lb

## 2023-07-19 DIAGNOSIS — I10 Essential (primary) hypertension: Secondary | ICD-10-CM

## 2023-07-19 DIAGNOSIS — E782 Mixed hyperlipidemia: Secondary | ICD-10-CM | POA: Diagnosis not present

## 2023-07-19 DIAGNOSIS — I25119 Atherosclerotic heart disease of native coronary artery with unspecified angina pectoris: Secondary | ICD-10-CM | POA: Diagnosis not present

## 2023-07-19 MED ORDER — ATORVASTATIN CALCIUM 40 MG PO TABS
40.0000 mg | ORAL_TABLET | Freq: Every day | ORAL | 3 refills | Status: DC
Start: 2023-07-19 — End: 2024-03-05

## 2023-07-19 MED ORDER — OZEMPIC (0.25 OR 0.5 MG/DOSE) 2 MG/1.5ML ~~LOC~~ SOPN: PEN_INJECTOR | SUBCUTANEOUS | 0 refills | Status: AC

## 2023-07-19 NOTE — Progress Notes (Signed)
Cardiology Office Note  Date: 07/19/2023   ID: Justin Villa, DOB 02-05-1966, MRN [id number]  History of Present Illness: Justin Villa is a 58 y.o. male last seen in August 2024.  He is here for a routine visit.  Reports no angina, stable dyspnea on exertion, no palpitations or syncope.  Has not been exercising as regularly due to bilateral knee pain.  He anticipates knee replacement surgery with Dr. Romeo Apple.  I reviewed his medications.  Current regimen includes aspirin, Lipitor, lisinopril, Toprol-XL, and as needed nitroglycerin.  Today we discussed consideration of Ozempic to reduce cardiovascular risk and also help him lose some weight.  We also discussed uptitrating his Lipitor.  Last LDL was 90.  I reviewed his cardiovascular testing from October 2024 as noted below.  Physical Exam: VS:  BP (!) 140/84 (BP Location: Left Arm, Patient Position: Sitting, Cuff Size: Normal)   Pulse 69   Ht 6' (1.829 m)   Wt 253 lb 9.6 oz (115 kg)   SpO2 96%   BMI 34.39 kg/m , BMI Body mass index is 34.39 kg/m.  Wt Readings from Last 3 Encounters:  07/19/23 253 lb 9.6 oz (115 kg)  07/06/23 250 lb 9.6 oz (113.7 kg)  04/10/23 252 lb 9.6 oz (114.6 kg)    General: Patient appears comfortable at rest. HEENT: Conjunctiva and lids normal. Neck: Supple, no elevated JVP or carotid bruits. Lungs: Clear to auscultation, nonlabored breathing at rest. Cardiac: Regular rate and rhythm, no S3 or significant systolic murmur. Extremities: No pitting edema.  ECG:  An ECG dated 01/29/2023 was personally reviewed today and demonstrated:  Sinus rhythm with left anterior fascicular block.  Labwork: 01/29/2023: ALT 27; AST 24; B Natriuretic Peptide 27.4; BUN 14; Creatinine, Ser 1.07; Potassium 3.8; Sodium 136 05/08/2023: Hemoglobin 16.9; Platelets 306     Component Value Date/Time   CHOL 156 06/17/2022 1011   TRIG 190 (H) 06/17/2022 1011   HDL 33 (L) 06/17/2022 1011   CHOLHDL 4.7 06/17/2022 1011    CHOLHDL 5.4 10/22/2019 0915   VLDL 32 10/22/2019 0915   LDLCALC 90 06/17/2022 1011   Other Studies Reviewed Today:  Echocardiogram 03/07/2023:  1. Left ventricular ejection fraction, by estimation, is 60 to 65%. The  left ventricle has normal function. The left ventricle has no regional  wall motion abnormalities. There is moderate concentric left ventricular  hypertrophy. Left ventricular  diastolic parameters are consistent with Grade II diastolic dysfunction  (pseudonormalization). The average left ventricular global longitudinal  strain is -22.1 %. The global longitudinal strain is normal.   2. Right ventricular systolic function is mildly reduced. The right  ventricular size is normal. Tricuspid regurgitation signal is inadequate  for assessing PA pressure.   3. The mitral valve is grossly normal, mildly calcified. Trivial mitral  valve regurgitation.   4. The aortic valve is tricuspid. There is mild calcification of the  aortic valve. Aortic valve regurgitation is not visualized. No aortic  stenosis is present.   5. Aortic dilatation noted. There is mild dilatation of the ascending  aorta, measuring 38 mm.   6. The inferior vena cava is normal in size with greater than 50%  respiratory variability, suggesting right atrial pressure of 3 mmHg.   Lexiscan Myoview 03/07/2023:   Findings are consistent with no ischemia. The study is low risk.   No ST deviation was noted. The ECG was negative for ischemia.   LV perfusion is normal.  Small, mild intensity, fixed apical defect  most consistent with soft tissue attenuation given normal wall motion on gated imaging.  No clear ischemic territories.   Left ventricular function is normal. Nuclear stress EF: 58%. End diastolic cavity size is normal. End systolic cavity size is normal.   Low risk study with soft tissue attenuation affecting the apex, no definite ischemia, LVEF 58%.  Assessment and Plan:  1.  Multivessel CAD status post CABG  in 2017 with LIMA to LAD, SVG to diagonal, SVG to OM1 and OM 2, and SVG to PDA.  LVEF 60 to 65% by echocardiogram in October 2024.  Lexiscan Myoview at that time was also low risk with no definite ischemia.  Reports chronic dyspnea exertion but no angina.  We discussed medical therapy, weight loss if possible.  He is currently on aspirin, Toprol-XL, lisinopril, Lipitor, and as needed nitroglycerin.  Plan to start Ozempic as well.   2.  Primary hypertension.  Blood pressure is mildly elevated today.  Discussed weight loss, continue lisinopril and Toprol-XL.   3.  Mixed hyperlipidemia.  LDL 90 in January 2024.  Increase Lipitor to 40 mg daily.  Disposition:  Follow up  6 months.  Signed, Jonelle Sidle, M.D., F.A.C.C. [facility]

## 2023-07-19 NOTE — Patient Instructions (Signed)
Medication Instructions:  INCREASE Atorvastatin to 40 mg daily  Take Semaglutide (ozempic) as directed:  Inject 0.25 mg into skin weekly for 4 weeks, then take 0.5 mg weekly for 4 weeks, then take 1 mg weekly thereafter     Labwork: None today  Testing/Procedures: None today  Follow-Up: 6 months  Any Other Special Instructions Will Be Listed Below (If Applicable).  If you need a refill on your cardiac medications before your next appointment, please call your pharmacy.

## 2023-07-26 ENCOUNTER — Telehealth: Payer: Self-pay | Admitting: Orthopedic Surgery

## 2023-07-26 NOTE — Telephone Encounter (Signed)
His insurance has denied coverage of the MRI needs conservative care first I called him to advise  He already had the MRI done  I scanned denial in The approval hopefully is also in, he states it was approved  The denial isdated  06/29/23 it appeared in my box up front  He is angry because he said he doesn't know what is going on he needs appointment to review the study, he said we need to get it together he has left several messages to get appointment to review the study  I told him we can call him back with appointment to review study   He said we need to get it together, I agreed with him, and told him we would try to get appointment for him to review the MRI, he said nobody knows what's going on, I told him again would get him in.  Can you make appointment to review MRI  To Dr Fran Lowes

## 2023-07-27 ENCOUNTER — Telehealth: Payer: Self-pay | Admitting: Orthopedic Surgery

## 2023-07-27 DIAGNOSIS — M869 Osteomyelitis, unspecified: Secondary | ICD-10-CM

## 2023-07-27 NOTE — Addendum Note (Signed)
Addended by: Michaele Offer on: 07/27/2023 12:44 PM   Modules accepted: Orders

## 2023-07-27 NOTE — Telephone Encounter (Signed)
Patient called to give results of MRI.  MRI shows osteomyelitis of the small toe and part of the fifth metatarsal  Patient advised that we would schedule him an appointment with Dr. Lajoyce Corners

## 2023-08-03 ENCOUNTER — Other Ambulatory Visit: Payer: Self-pay | Admitting: Cardiology

## 2023-08-07 ENCOUNTER — Other Ambulatory Visit: Payer: Self-pay | Admitting: Cardiology

## 2023-08-07 ENCOUNTER — Other Ambulatory Visit: Payer: Self-pay | Admitting: Gastroenterology

## 2023-08-07 ENCOUNTER — Encounter: Payer: Self-pay | Admitting: Orthopedic Surgery

## 2023-08-07 ENCOUNTER — Ambulatory Visit: Payer: No Typology Code available for payment source | Admitting: Orthopedic Surgery

## 2023-08-07 DIAGNOSIS — M722 Plantar fascial fibromatosis: Secondary | ICD-10-CM | POA: Diagnosis not present

## 2023-08-07 DIAGNOSIS — M79671 Pain in right foot: Secondary | ICD-10-CM | POA: Diagnosis not present

## 2023-08-07 DIAGNOSIS — M869 Osteomyelitis, unspecified: Secondary | ICD-10-CM | POA: Diagnosis not present

## 2023-08-07 NOTE — Progress Notes (Signed)
 Office Visit Note   Patient: Justin Villa           Date of Birth: 03-Nov-1965           MRN: [id number] Visit Date: 08/07/2023              Requested by: Vickki Hearing, MD [address] [place],  [place] [address] PCP: Billie Lade, MD  Chief Complaint  Patient presents with   Right Foot - Pain      HPI: Patient is a 58 year old gentleman who is seen for initial evaluation for osteomyelitis fifth metatarsal right foot.  Patient is status post debridement of a ulcer fifth toe MTPJ in January 2022  Assessment & Plan: Visit Diagnoses:  1. Pain in right foot   2. Osteomyelitis of fifth toe of right foot (HCC)     Plan: With the extensive osteomyelitis of the fifth metatarsal have recommended proceeding with 1/5 ray amputation.  Patient will be out of town next week and will plan for surgical intervention when he returns.  Risk and benefits were discussed including postoperative care risk of the wound not healing need for additional surgery.  Recommended Achilles stretching with his knee extended.  Recommended a stiff soled walking sneaker and sole orthotics.  Follow-Up Instructions: Return if symptoms worsen or fail to improve.   Ortho Exam  Patient is alert, oriented, no adenopathy, well-dressed, normal affect, normal respiratory effort. Examination patient has a strong dorsalis pedis pulse.  He is tender to palpation along the fifth metatarsal right foot.  There is no sausage digit swelling of the toe.  Review of the MRI scan shows edema within the entire metatarsal consistent with osteomyelitis.  There is also edema in the proximal phalanx of the little toe also consistent with osteomyelitis.  Patient's most recent hemoglobin A1c is 5.7 with prediabetes.  Patient also complains of pain in the origin of the plantar fascia medially.  This is tender to palpation lateral compression is not tender.  Patient has dorsiflexion to neutral with Achilles  tightness. Imaging: No results found. No images are attached to the encounter.  Labs: Lab Results  Component Value Date   HGBA1C 5.7 (H) 06/17/2022   HGBA1C 5.1 11/27/2015   HGBA1C 5.6 11/26/2015   ESRSEDRATE 2 05/08/2023   ESRSEDRATE 14 06/12/2020   CRP <3.0 05/08/2023   CRP 1.7 (H) 06/12/2020   REPTSTATUS 06/21/2020 FINAL 06/16/2020   REPTSTATUS 06/21/2020 FINAL 06/16/2020   GRAMSTAIN  06/16/2020    FEW WBC PRESENT,BOTH PMN AND MONONUCLEAR NO ORGANISMS SEEN    GRAMSTAIN  06/16/2020    RARE WBC PRESENT, PREDOMINANTLY PMN NO ORGANISMS SEEN    CULT  06/16/2020    RARE PSEUDOMONAS AERUGINOSA NO ANAEROBES ISOLATED Performed at [facility], [address] [place], [place] [address]    CULT  06/16/2020    RARE PSEUDOMONAS AERUGINOSA NO ANAEROBES ISOLATED Performed at [facility], [address], [place], [place] [address]    LABORGA PSEUDOMONAS AERUGINOSA 06/16/2020   LABORGA PSEUDOMONAS AERUGINOSA 06/16/2020     Lab Results  Component Value Date   ALBUMIN 4.1 01/29/2023   ALBUMIN 4.2 06/17/2022   ALBUMIN 4.0 06/07/2022    Lab Results  Component Value Date   MG 2.5 (H) 11/28/2015   MG 2.4 11/28/2015   MG 2.6 (H) 11/27/2015   Lab Results  Component Value Date   VD25OH 26.8 (L) 06/17/2022    No results found for: "PREALBUMIN"  Latest Ref Rng & Units 05/08/2023    3:12 PM 01/29/2023   10:29 AM 10/14/2022    2:45 PM  CBC EXTENDED  WBC 3.8 - 10.8 Thousand/uL 7.9  7.2  5.8   RBC 4.20 - 5.80 Million/uL 5.91  5.46  4.57   Hemoglobin 13.2 - 17.1 g/dL 82.9  56.2  13.0   HCT 38.5 - 50.0 % 51.1  45.7  39.9   Platelets 140 - 400 Thousand/uL 306  319  278   NEUT# 1,500 - 7,800 cells/uL 4,329  4.4  2.8   Lymph# 0.7 - 4.0 K/uL  1.9  2.2      There is no height or weight on file to calculate BMI.  Orders:  Orders Placed This Encounter  Procedures   Uric acid   No orders of the defined types were placed in this encounter.    Procedures: No  procedures performed  Clinical Data: No additional findings.  ROS:  All other systems negative, except as noted in the HPI. Review of Systems  Objective: Vital Signs: There were no vitals taken for this visit.  Specialty Comments:  No specialty comments available.  PMFS History: Patient Active Problem List   Diagnosis Date Noted   History of colonic polyps 07/06/2023   Hypocalcemia 04/10/2023   Left carpal tunnel syndrome 10/19/2022   Loud snoring 10/12/2022   Facial skin lesion 10/07/2022   Prediabetes 10/07/2022   Vitamin D insufficiency 10/07/2022   Barrett's esophagus 10/07/2022   GERD (gastroesophageal reflux disease) 07/05/2022   Numbness and tingling in left hand 06/19/2022   History of diverticulitis 06/19/2022   Encounter for general adult medical examination with abnormal findings 06/19/2022   Risk factors for obstructive sleep apnea 06/19/2022   S/P foot surgery, right I and D 06/16/20  06/18/2020   Septic arthritis of right foot (HCC)    Pain in right foot 06/15/2020   HTN (hypertension) 12/10/2015   HLD (hyperlipidemia) 12/10/2015   Carotid stenosis    CAD (coronary artery disease) 11/27/2015   NSTEMI (non-ST elevated myocardial infarction) (HCC) 11/26/2015   Arthropathy, forearm 07/19/2010   Past Medical History:  Diagnosis Date   CAD (coronary artery disease) 11/27/2015   a. S/p NSTEMI 6/17 >> s/p CABG // b. LHC 6/17: oLAD 50, mLAD 100, D1 70, D2 95, oLCx 95, mLCx 65, OM1 85, OM2 90, mRCA 85, dRCA 75, RPDA 40/50, RPLB2 50    Carotid stenosis    a. Carotid US 6/17: bilat ICA 1-39%   Essential hypertension    GERD (gastroesophageal reflux disease)    History of non-ST elevation myocardial infarction (NSTEMI) 11/2015   Hyperlipidemia    Stroke [facility])     Family History  Problem Relation Age of Onset   Heart disease Father    Colon cancer Neg Hx     Past Surgical History:  Procedure Laterality Date   BACK SURGERY     BIOPSY  08/25/2022    Procedure: BIOPSY;  Surgeon: Lanelle Bal, DO;  [facility];  Service: Endoscopy;;   CARDIAC CATHETERIZATION N/A 11/26/2015   Procedure: Left Heart Cath and Coronary Angiography;  Surgeon: Lyn Records, MD;  Location: [facility] INVASIVE CV LAB;  Service: Cardiovascular;  Laterality: N/A;   CARPAL TUNNEL RELEASE Left 10/19/2022   Procedure: CARPAL TUNNEL RELEASE;  Surgeon: Vickki Hearing, MD;  Location: [facility] ORS;  Service: Orthopedics;  Laterality: Left;   COLONOSCOPY WITH PROPOFOL N/A 08/25/2022   Procedure: COLONOSCOPY WITH PROPOFOL;  Surgeon: Lanelle Bal, DO;  [facility];  Service: Endoscopy;  Laterality: N/A;  10:30am, asa 3   CORONARY ARTERY BYPASS GRAFT N/A 11/27/2015   Procedure: CORONARY ARTERY BYPASS GRAFTING (CABG) x 5 (LIMA to LAD, SVG to DIAGONAL, SVG SEQUENTIALLY to OM1 and OM2, SVG to PDA) with EVH from right leg using greater saphenous vein and mammary.;  Surgeon: Delight Ovens, MD;  Location: [facility] OR;  Service: Open Heart Surgery;  Laterality: N/A;   ESOPHAGOGASTRODUODENOSCOPY (EGD) WITH PROPOFOL N/A 08/25/2022   Procedure: ESOPHAGOGASTRODUODENOSCOPY (EGD) WITH PROPOFOL;  Surgeon: Lanelle Bal, DO;  [facility];  Service: Endoscopy;  Laterality: N/A;   INCISION AND DRAINAGE ABSCESS Right 06/16/2020   Procedure: INCISION AND DRAINAGE ABSCESS RIGHT PINKY TOE (5th toe);  Surgeon: Vickki Hearing, MD;  Location: [facility] ORS;  Service: Orthopedics;  Laterality: Right;   INTRAOPERATIVE TRANSESOPHAGEAL ECHOCARDIOGRAM N/A 11/27/2015   Procedure: INTRAOPERATIVE TRANSESOPHAGEAL ECHOCARDIOGRAM;  Surgeon: Delight Ovens, MD;  Location: [facility] OR;  Service: Open Heart Surgery;  Laterality: N/A;   KNEE SURGERY Left    arthroscopy   POLYPECTOMY  08/25/2022   Procedure: POLYPECTOMY;  Surgeon: Lanelle Bal, DO;  [facility];  Service: Endoscopy;;   Social History   Occupational History   Not on file  Tobacco Use   Smoking status:  Never   Smokeless tobacco: Never  Vaping Use   Vaping status: Never Used  Substance and Sexual Activity   Alcohol use: Yes    Alcohol/week: 2.0 standard drinks of alcohol    Types: 2 Shots of liquor per week    Comment: everyother day   Drug use: No   Sexual activity: Yes

## 2023-08-08 LAB — URIC ACID: Uric Acid, Serum: 5.7 mg/dL (ref 4.0–8.0)

## 2023-08-09 ENCOUNTER — Telehealth: Payer: Self-pay

## 2023-08-09 NOTE — Telephone Encounter (Signed)
-----   Message from Nadara Mustard sent at 08/08/2023  7:31 AM EST ----- Uric acid 5.7.  No gout.  Call patient. ----- Message ----- From: Janace Hoard Lab Results In Sent: 08/08/2023   5:30 AM EST To: Nadara Mustard, MD

## 2023-08-09 NOTE — Telephone Encounter (Signed)
Called pt to advise of result

## 2023-08-17 ENCOUNTER — Telehealth: Payer: Self-pay | Admitting: Pharmacy Technician

## 2023-08-17 ENCOUNTER — Other Ambulatory Visit (HOSPITAL_COMMUNITY): Payer: Self-pay

## 2023-08-17 NOTE — Telephone Encounter (Signed)
 Pharmacy Patient Advocate Encounter   Received notification from Fax that prior authorization for [facility] is required/requested.   Insurance verification completed.   The patient is insured through [facility] .   Per test claim: PA required; PA submitted to above mentioned insurance via CoverMyMeds Key/confirmation #/EOC [id number] Status is pending    REJECT SAID NEEDS TO TRY METFORMIN, HE HAS NOT. HE DOES NOT HAVE DIABETES. USING FOR CARDIO BENEFIT

## 2023-08-18 NOTE — Telephone Encounter (Signed)
 Ozempic denied by [employer].   Please advise.

## 2023-08-18 NOTE — Telephone Encounter (Signed)
 Pharmacy Patient Advocate Encounter  Received notification from [facility] that Prior Authorization for ozempic has been DENIED.  Full denial letter will be uploaded to the media tab. See denial reason below.  Even though we put in this would be to reduce cardiovascular risk they denied this saying: Denied. This drug is not covered because drugs used to treat weight gain are not covered by [employer] [facility]. Please refer to your Member Handbook for details.

## 2023-08-21 ENCOUNTER — Encounter (HOSPITAL_COMMUNITY): Payer: Self-pay | Admitting: Orthopedic Surgery

## 2023-08-21 ENCOUNTER — Other Ambulatory Visit: Payer: Self-pay

## 2023-08-21 NOTE — Telephone Encounter (Signed)
 MyChart message sent to patient with Dr.McDowell's comment.

## 2023-08-21 NOTE — Progress Notes (Signed)
 SDW call  Patient was given pre-op instructions over the phone. Patient verbalized understanding of instructions provided.     PCP - Dr. Christel Mormon Cardiologist - Dr. Nona Dell Pulmonary:    PPM/ICD - denies Device Orders - na Rep Notified - na   Chest x-ray - na EKG -  01/29/2023 Stress Test - 03/07/2023 ECHO - 03/07/2023 Cardiac Cath - 11/26/2015  Sleep Study/sleep apnea/CPAP: denies  Non-diabetic   Blood Thinner Instructions: denies Aspirin Instructions:hold DOS   ERAS Protcol - Clears until 0650   Anesthesia review: Yes. CAD, HTN, stroke, HLD, heart history   Patient denies shortness of breath, fever, cough and chest pain over the phone call  Your procedure is scheduled on Wednesday August 23, 2023  Report to [facility] Main Entrance "A" at  0720  A.M., then check in with the Admitting office.  Call this number if you have problems the morning of surgery:  [phone]   If you have any questions prior to your surgery date call [phone]: Open Monday-Friday 8am-4pm If you experience any cold or flu symptoms such as cough, fever, chills, shortness of breath, etc. between now and your scheduled surgery, please notify us at the above number    Remember:  Do not eat after midnight the night before your surgery  You may drink clear liquids until  0680   the morning of your surgery.   Clear liquids allowed are: Water, Non-Citrus Juices (without pulp), Carbonated Beverages, Clear Tea, Black Coffee ONLY (NO MILK, CREAM OR POWDERED CREAMER of any kind), and Gatorade   Take these medicines the morning of surgery with A SIP OF WATER:  Atorvastatin, pepcid, metoprolol, protonix  As needed: Nitroglycerin   As of today, STOP taking any Aleve, Naproxen, Ibuprofen, Motrin, Advil, Goody's, BC's, all herbal medications, fish oil, and all vitamins.

## 2023-08-22 ENCOUNTER — Encounter (HOSPITAL_COMMUNITY): Payer: Self-pay | Admitting: Orthopedic Surgery

## 2023-08-22 NOTE — Progress Notes (Signed)
 Case: [id number] Date/Time: 08/23/23 0935   Procedure: AMPUTATION, FOOT, RAY (Right) - RIGHT 5TH RAY AMPUTATION   Anesthesia type: Choice   Diagnosis: Acute osteomyelitis of metatarsal bone of right foot (HCC) [M86.171]   Pre-op diagnosis: Osteomyelitis Right 5th Metatarsal   [facility] / [facility]   Surgeons: Nadara Mustard, MD       DISCUSSION: Justin Villa is a 58 year old male who is a SDW prior to surgery above.  Past medical history significant for history of NSTEMI and CAD s/p CABG (2017), carotid artery stenosis, hypertension, hyperlipidemia, possible OSA, history of CVA, GERD, prediabetes, arthritis.  Patient follows with cardiology for history of NSTEMI, CAD status post CABG in 2017.  Last seen in clinic on 07/19/2023.  Dr. Diona Browner attempted to start patient on Ozempic however not covered by insurance.  Stress test in October 2024 was low risk.  Echo in October 2024 showed normal LVEF, grade 2 diastolic dysfunction, and no significant valve disorder.  Patient had an evaluation by pulmonology in May 2024 for snoring and fatigue.  He was set up for a sleep study which has yet to be completed.  Patient with history of CVA seen on CTA head in 2020 (Small chronic right occipital cortical infarct.).  Patient denies history of symptoms.  VS: Ht 6' (1.829 m)   Wt 102.1 kg   BMI 30.52 kg/m   PROVIDERS: Billie Lade, MD Cardiologist - Dr. Nona Dell  LABS: n/a   IMAGES: CTA chest 01/29/2023:  IMPRESSION: 1. No filling defect is identified in the pulmonary arterial tree to suggest pulmonary embolus. 2. Airway thickening is present, suggesting bronchitis or reactive airways disease. 3. Distal esophageal circumferential wall thickening, nonspecific although esophagitis would be the most common cause. 4. Aortic and coronary atherosclerosis, prior CABG.   Aortic Atherosclerosis (ICD10-I70.0).     EKG:   CV: Stress test 03/07/2023:  Narrative &  Impression     Findings are consistent with no ischemia. The study is low risk.   No ST deviation was noted. The ECG was negative for ischemia.   LV perfusion is normal.  Small, mild intensity, fixed apical defect most consistent with soft tissue attenuation given normal wall motion on gated imaging.  No clear ischemic territories.   Left ventricular function is normal. Nuclear stress EF: 58%. End diastolic cavity size is normal. End systolic cavity size is normal.   Low risk study with soft tissue attenuation affecting the apex, no definite ischemia, LVEF 58%.  Echo 03/07/2023:   IMPRESSIONS    1. Left ventricular ejection fraction, by estimation, is 60 to 65%. The left ventricle has normal function. The left ventricle has no regional wall motion abnormalities. There is moderate concentric left ventricular hypertrophy. Left ventricular diastolic parameters are consistent with Grade II diastolic dysfunction (pseudonormalization). The average left ventricular global longitudinal strain is -22.1 %. The global longitudinal strain is normal.  2. Right ventricular systolic function is mildly reduced. The right ventricular size is normal. Tricuspid regurgitation signal is inadequate for assessing PA pressure.  3. The mitral valve is grossly normal, mildly calcified. Trivial mitral valve regurgitation.  4. The aortic valve is tricuspid. There is mild calcification of the aortic valve. Aortic valve regurgitation is not visualized. No aortic stenosis is present.  5. Aortic dilatation noted. There is mild dilatation of the ascending aorta, measuring 38 mm.  6. The inferior vena cava is normal in size with greater than 50% respiratory variability, suggesting right atrial pressure  of 3 mmHg.  Comparison(s): Prior images unable to be directly viewed. Past Medical History:  Diagnosis Date   CAD (coronary artery disease) 11/27/2015   a. S/p NSTEMI 6/17 >> s/p CABG // b. LHC 6/17: oLAD 50, mLAD  100, D1 70, D2 95, oLCx 95, mLCx 65, OM1 85, OM2 90, mRCA 85, dRCA 75, RPDA 40/50, RPLB2 50    Carotid stenosis    a. Carotid US 6/17: bilat ICA 1-39%   Essential hypertension    GERD (gastroesophageal reflux disease)    History of non-ST elevation myocardial infarction (NSTEMI) 11/2015   Hyperlipidemia    Stroke [facility])     Past Surgical History:  Procedure Laterality Date   BACK SURGERY     BIOPSY  08/25/2022   Procedure: BIOPSY;  Surgeon: Lanelle Bal, DO;  [facility];  Service: Endoscopy;;   CARDIAC CATHETERIZATION N/A 11/26/2015   Procedure: Left Heart Cath and Coronary Angiography;  Surgeon: Lyn Records, MD;  Location[facility] INVASIVE CV LAB;  Service: Cardiovascular;  Laterality: N/A;   CARPAL TUNNEL RELEASE Left 10/19/2022   Procedure: CARPAL TUNNEL RELEASE;  Surgeon: Vickki Hearing, MD;  Location: [facility];  Service: Orthopedics;  Laterality: Left;   COLONOSCOPY WITH PROPOFOL N/A 08/25/2022   Procedure: COLONOSCOPY WITH PROPOFOL;  Surgeon: Lanelle Bal, DO;  [facility];  Service: Endoscopy;  Laterality: N/A;  10:30am, asa 3   CORONARY ARTERY BYPASS GRAFT N/A 11/27/2015   Procedure: CORONARY ARTERY BYPASS GRAFTING (CABG) x 5 (LIMA to LAD, SVG to DIAGONAL, SVG SEQUENTIALLY to OM1 and OM2, SVG to PDA) with EVH from right leg using greater saphenous vein and mammary.;  Surgeon: Delight Ovens, MD;  Location: [facility] OR;  Service: Open Heart Surgery;  Laterality: N/A;   ESOPHAGOGASTRODUODENOSCOPY (EGD) WITH PROPOFOL N/A 08/25/2022   Procedure: ESOPHAGOGASTRODUODENOSCOPY (EGD) WITH PROPOFOL;  Surgeon: Lanelle Bal, DO;  [facility];  Service: Endoscopy;  Laterality: N/A;   INCISION AND DRAINAGE ABSCESS Right 06/16/2020   Procedure: INCISION AND DRAINAGE ABSCESS RIGHT PINKY TOE (5th toe);  Surgeon: Vickki Hearing, MD;  Location: [facility];  Service: Orthopedics;  Laterality: Right;   INTRAOPERATIVE TRANSESOPHAGEAL ECHOCARDIOGRAM N/A  11/27/2015   Procedure: INTRAOPERATIVE TRANSESOPHAGEAL ECHOCARDIOGRAM;  Surgeon: Delight Ovens, MD;  Location: [facility] OR;  Service: Open Heart Surgery;  Laterality: N/A;   KNEE SURGERY Left    arthroscopy   POLYPECTOMY  08/25/2022   Procedure: POLYPECTOMY;  Surgeon: Lanelle Bal, DO;  [facility];  Service: Endoscopy;;    MEDICATIONS: No current facility-administered medications for this encounter.    atorvastatin (LIPITOR) 40 MG tablet   aspirin EC 81 MG EC tablet   Cholecalciferol (VITAMIN D) 50 MCG (2000 UT) CAPS   famotidine (PEPCID) 20 MG tablet   lisinopril (ZESTRIL) 40 MG tablet   metoprolol succinate (TOPROL-XL) 25 MG 24 hr tablet   nitroGLYCERIN (NITROSTAT) 0.4 MG SL tablet   pantoprazole (PROTONIX) 40 MG tablet   Semaglutide,0.25 or 0.5MG /DOS, (OZEMPIC, 0.25 OR 0.5 MG/DOSE,) 2 MG/1.5ML SOPN   Ubaldo Glassing, PA-C [facility] Surgical Short Stay/Anesthesiology [facility] Phone [phone] 08/22/2023 9:10 AM

## 2023-08-22 NOTE — Anesthesia Preprocedure Evaluation (Signed)
 Anesthesia Evaluation  Patient identified by MRN, date of birth, ID band Patient awake    Reviewed: Allergy & Precautions, H&P , NPO status , Patient's Chart, lab work & pertinent test results, reviewed documented beta blocker date and time   Airway Mallampati: II  TM Distance: >3 FB Neck ROM: Full    Dental  (+) Caps, Dental Advisory Given   Pulmonary neg pulmonary ROS   Pulmonary exam normal breath sounds clear to auscultation       Cardiovascular hypertension, Pt. on medications and Pt. on home beta blockers + CAD, + Past MI and + CABG  Normal cardiovascular exam Rhythm:Regular Rate:Normal     Neuro/Psych CVA (denied h/o stroke)  negative psych ROS   GI/Hepatic Neg liver ROS,GERD  Medicated,,  Endo/Other  negative endocrine ROS    Renal/GU negative Renal ROS  negative genitourinary   Musculoskeletal  (+) Arthritis ,    Abdominal   Peds negative pediatric ROS (+)  Hematology negative hematology ROS (+)   Anesthesia Other Findings Numbness and tingling in left hand  Reproductive/Obstetrics negative OB ROS                             Anesthesia Physical Anesthesia Plan  ASA: 3  Anesthesia Plan: General   Post-op Pain Management: Regional block*   Induction:   PONV Risk Score and Plan: 1 and Ondansetron  Airway Management Planned: LMA  Additional Equipment:   Intra-op Plan:   Post-operative Plan:   Informed Consent: I have reviewed the patients History and Physical, chart, labs and discussed the procedure including the risks, benefits and alternatives for the proposed anesthesia with the patient or authorized representative who has indicated his/her understanding and acceptance.     Dental advisory given  Plan Discussed with: CRNA and Surgeon  Anesthesia Plan Comments: (See PAT note from 3/18 by Sherlie Ban PA-C )       Anesthesia Quick Evaluation

## 2023-08-23 ENCOUNTER — Ambulatory Visit (HOSPITAL_COMMUNITY): Payer: Self-pay | Admitting: Medical

## 2023-08-23 ENCOUNTER — Ambulatory Visit (HOSPITAL_COMMUNITY)
Admission: RE | Admit: 2023-08-23 | Discharge: 2023-08-23 | Disposition: A | Discharge status: Home / Self Care | Attending: Orthopedic Surgery | Admitting: Orthopedic Surgery

## 2023-08-23 ENCOUNTER — Other Ambulatory Visit: Payer: Self-pay

## 2023-08-23 ENCOUNTER — Ambulatory Visit (HOSPITAL_BASED_OUTPATIENT_CLINIC_OR_DEPARTMENT_OTHER): Payer: Self-pay | Admitting: Medical

## 2023-08-23 ENCOUNTER — Encounter (HOSPITAL_COMMUNITY)
Admission: RE | Disposition: A | Discharge status: Home / Self Care | Payer: Self-pay | Source: Home / Self Care | Attending: Orthopedic Surgery

## 2023-08-23 ENCOUNTER — Encounter (HOSPITAL_COMMUNITY): Payer: Self-pay | Admitting: Orthopedic Surgery

## 2023-08-23 DIAGNOSIS — M1990 Unspecified osteoarthritis, unspecified site: Secondary | ICD-10-CM | POA: Diagnosis not present

## 2023-08-23 DIAGNOSIS — K219 Gastro-esophageal reflux disease without esophagitis: Secondary | ICD-10-CM | POA: Diagnosis not present

## 2023-08-23 DIAGNOSIS — I2510 Atherosclerotic heart disease of native coronary artery without angina pectoris: Secondary | ICD-10-CM | POA: Insufficient documentation

## 2023-08-23 DIAGNOSIS — M79671 Pain in right foot: Secondary | ICD-10-CM | POA: Diagnosis not present

## 2023-08-23 DIAGNOSIS — Z8673 Personal history of transient ischemic attack (TIA), and cerebral infarction without residual deficits: Secondary | ICD-10-CM | POA: Diagnosis not present

## 2023-08-23 DIAGNOSIS — M86171 Other acute osteomyelitis, right ankle and foot: Secondary | ICD-10-CM | POA: Diagnosis present

## 2023-08-23 DIAGNOSIS — I10 Essential (primary) hypertension: Secondary | ICD-10-CM | POA: Insufficient documentation

## 2023-08-23 DIAGNOSIS — Z951 Presence of aortocoronary bypass graft: Secondary | ICD-10-CM | POA: Diagnosis not present

## 2023-08-23 DIAGNOSIS — I252 Old myocardial infarction: Secondary | ICD-10-CM

## 2023-08-23 DIAGNOSIS — I700 Atherosclerosis of aorta: Secondary | ICD-10-CM | POA: Insufficient documentation

## 2023-08-23 HISTORY — PX: AMPUTATION: SHX166

## 2023-08-23 LAB — COMPREHENSIVE METABOLIC PANEL
ALT: 65 U/L — ABNORMAL HIGH (ref 0–44)
AST: 62 U/L — ABNORMAL HIGH (ref 15–41)
Albumin: 3.8 g/dL (ref 3.5–5.0)
Alkaline Phosphatase: 42 U/L (ref 38–126)
Anion gap: 10 (ref 5–15)
BUN: 13 mg/dL (ref 6–20)
CO2: 20 mmol/L — ABNORMAL LOW (ref 22–32)
Calcium: 8.8 mg/dL — ABNORMAL LOW (ref 8.9–10.3)
Chloride: 106 mmol/L (ref 98–111)
Creatinine, Ser: 1.06 mg/dL (ref 0.61–1.24)
GFR, Estimated: >60 mL/min (ref >60)
Glucose, Bld: 112 mg/dL — ABNORMAL HIGH (ref 70–99)
Potassium: 4.5 mmol/L (ref 3.5–5.1)
Sodium: 136 mmol/L (ref 135–145)
Total Bilirubin: 2 mg/dL — ABNORMAL HIGH (ref 0.0–1.2)
Total Protein: 6.5 g/dL (ref 6.5–8.1)

## 2023-08-23 LAB — CBC
HCT: 53.1 % — ABNORMAL HIGH (ref 39.0–52.0)
Hemoglobin: 17.7 g/dL — ABNORMAL HIGH (ref 13.0–17.0)
MCH: 27.3 pg (ref 26.0–34.0)
MCHC: 33.3 g/dL (ref 30.0–36.0)
MCV: 81.9 fL (ref 80.0–100.0)
Platelets: 255 10*3/uL (ref 150–400)
RBC: 6.48 MIL/uL — ABNORMAL HIGH (ref 4.22–5.81)
RDW: 16.4 % — ABNORMAL HIGH (ref 11.5–15.5)
WBC: 6.9 10*3/uL (ref 4.0–10.5)
nRBC: 0 % (ref 0.0–0.2)

## 2023-08-23 SURGERY — AMPUTATION, FOOT, RAY
Anesthesia: General | Site: Fifth Toe | Laterality: Right

## 2023-08-23 MED ORDER — FENTANYL CITRATE (PF) 250 MCG/5ML IJ SOLN
INTRAMUSCULAR | Status: AC
  Filled 2023-08-23: qty 5

## 2023-08-23 MED ORDER — DEXAMETHASONE SODIUM PHOSPHATE 10 MG/ML IJ SOLN
INTRAMUSCULAR | Status: DC | PRN
  Administered 2023-08-23: 10 mg via INTRAVENOUS

## 2023-08-23 MED ORDER — CEFAZOLIN SODIUM-DEXTROSE 2-4 GM/100ML-% IV SOLN
2.0000 g | INTRAVENOUS | Status: AC
  Administered 2023-08-23: 2 g via INTRAVENOUS
  Filled 2023-08-23: qty 100

## 2023-08-23 MED ORDER — OXYCODONE HCL 5 MG PO TABS: 5.0000 mg | ORAL_TABLET | Freq: Once | ORAL | Status: DC | PRN

## 2023-08-23 MED ORDER — METOPROLOL SUCCINATE ER 25 MG PO TB24
25.0000 mg | ORAL_TABLET | Freq: Once | ORAL | Status: AC
  Administered 2023-08-23: 25 mg via ORAL
  Filled 2023-08-23: qty 1

## 2023-08-23 MED ORDER — PROPOFOL 10 MG/ML IV BOLUS
INTRAVENOUS | Status: DC | PRN
  Administered 2023-08-23: 200 mg via INTRAVENOUS

## 2023-08-23 MED ORDER — 0.9 % SODIUM CHLORIDE (POUR BTL) OPTIME
TOPICAL | Status: DC | PRN
  Administered 2023-08-23: 1000 mL

## 2023-08-23 MED ORDER — PROPOFOL 10 MG/ML IV BOLUS
INTRAVENOUS | Status: AC
  Filled 2023-08-23: qty 20

## 2023-08-23 MED ORDER — CHLORHEXIDINE GLUCONATE 0.12 % MT SOLN
15.0000 mL | Freq: Once | OROMUCOSAL | Status: AC
  Administered 2023-08-23: 15 mL via OROMUCOSAL
  Filled 2023-08-23: qty 15

## 2023-08-23 MED ORDER — ONDANSETRON HCL 4 MG/2ML IJ SOLN
INTRAMUSCULAR | Status: AC
  Filled 2023-08-23: qty 2

## 2023-08-23 MED ORDER — FENTANYL CITRATE (PF) 100 MCG/2ML IJ SOLN
INTRAMUSCULAR | Status: AC
  Administered 2023-08-23: 100 ug via INTRAVENOUS
  Filled 2023-08-23: qty 2

## 2023-08-23 MED ORDER — VASHE WOUND IRRIGATION OPTIME
TOPICAL | Status: DC | PRN
  Administered 2023-08-23: 34 [oz_av]

## 2023-08-23 MED ORDER — FENTANYL CITRATE (PF) 250 MCG/5ML IJ SOLN
INTRAMUSCULAR | Status: DC | PRN
  Administered 2023-08-23 (×2): 25 ug via INTRAVENOUS

## 2023-08-23 MED ORDER — OXYCODONE-ACETAMINOPHEN 5-325 MG PO TABS: 1.0000 | ORAL_TABLET | ORAL | 0 refills | Status: DC | PRN

## 2023-08-23 MED ORDER — MIDAZOLAM HCL 2 MG/2ML IJ SOLN
INTRAMUSCULAR | Status: AC
Start: 2023-08-23 — End: 2023-08-23
  Administered 2023-08-23: 2 mg via INTRAVENOUS
  Filled 2023-08-23: qty 2

## 2023-08-23 MED ORDER — ONDANSETRON HCL 4 MG/2ML IJ SOLN
INTRAMUSCULAR | Status: DC | PRN
  Administered 2023-08-23: 4 mg via INTRAVENOUS

## 2023-08-23 MED ORDER — ORAL CARE MOUTH RINSE: 15.0000 mL | Freq: Once | OROMUCOSAL | Status: AC

## 2023-08-23 MED ORDER — LACTATED RINGERS IV SOLN
INTRAVENOUS | Status: DC
Start: 2023-08-23 — End: 2023-08-23

## 2023-08-23 MED ORDER — MEPERIDINE HCL 25 MG/ML IJ SOLN: 6.2500 mg | INTRAMUSCULAR | Status: DC | PRN

## 2023-08-23 MED ORDER — ROPIVACAINE HCL 5 MG/ML IJ SOLN
INTRAMUSCULAR | Status: DC | PRN
  Administered 2023-08-23: 30 mL via PERINEURAL

## 2023-08-23 MED ORDER — SODIUM CHLORIDE 0.9 % IV SOLN: 12.5000 mg | INTRAVENOUS | Status: DC | PRN

## 2023-08-23 MED ORDER — MIDAZOLAM HCL 2 MG/2ML IJ SOLN: 2.0000 mg | Freq: Once | INTRAMUSCULAR | Status: AC

## 2023-08-23 MED ORDER — AMISULPRIDE (ANTIEMETIC) 5 MG/2ML IV SOLN: 10.0000 mg | Freq: Once | INTRAVENOUS | Status: DC | PRN

## 2023-08-23 MED ORDER — DEXAMETHASONE SODIUM PHOSPHATE 10 MG/ML IJ SOLN
INTRAMUSCULAR | Status: AC
  Filled 2023-08-23: qty 1

## 2023-08-23 MED ORDER — LIDOCAINE 2% (20 MG/ML) 5 ML SYRINGE
INTRAMUSCULAR | Status: DC | PRN
  Administered 2023-08-23: 40 mg via INTRAVENOUS

## 2023-08-23 MED ORDER — LIDOCAINE 2% (20 MG/ML) 5 ML SYRINGE
INTRAMUSCULAR | Status: AC
  Filled 2023-08-23: qty 5

## 2023-08-23 MED ORDER — HYDROMORPHONE HCL 1 MG/ML IJ SOLN: 0.2500 mg | INTRAMUSCULAR | Status: DC | PRN

## 2023-08-23 MED ORDER — FENTANYL CITRATE (PF) 100 MCG/2ML IJ SOLN: 100.0000 ug | Freq: Once | INTRAMUSCULAR | Status: AC

## 2023-08-23 MED ORDER — OXYCODONE HCL 5 MG/5ML PO SOLN: 5.0000 mg | Freq: Once | ORAL | Status: DC | PRN

## 2023-08-23 SURGICAL SUPPLY — 28 items
BAG COUNTER SPONGE SURGICOUNT (BAG) ×1 IMPLANT
BLADE AVERAGE 25X9 (BLADE) IMPLANT
BLADE SAW SGTL MED 73X18.5 STR (BLADE) IMPLANT
BLADE SURG 21 STRL SS (BLADE) ×1 IMPLANT
BNDG COHESIVE 4X5 TAN STRL LF (GAUZE/BANDAGES/DRESSINGS) ×1 IMPLANT
BNDG GAUZE DERMACEA FLUFF 4 (GAUZE/BANDAGES/DRESSINGS) ×1 IMPLANT
CLEANSER WND VASHE INSTL 34OZ (WOUND CARE) IMPLANT
COVER SURGICAL LIGHT HANDLE (MISCELLANEOUS) ×2 IMPLANT
DRAPE U-SHAPE 47X51 STRL (DRAPES) ×2 IMPLANT
DRSG ADAPTIC 3X8 NADH LF (GAUZE/BANDAGES/DRESSINGS) ×1 IMPLANT
DURAPREP 26ML APPLICATOR (WOUND CARE) ×1 IMPLANT
ELECT REM PT RETURN 9FT ADLT (ELECTROSURGICAL) ×1 IMPLANT
ELECTRODE REM PT RTRN 9FT ADLT (ELECTROSURGICAL) ×1 IMPLANT
GAUZE PAD ABD 8X10 STRL (GAUZE/BANDAGES/DRESSINGS) ×2 IMPLANT
GAUZE SPONGE 4X4 12PLY STRL (GAUZE/BANDAGES/DRESSINGS) ×1 IMPLANT
GLOVE BIOGEL PI IND STRL 9 (GLOVE) ×1 IMPLANT
GLOVE SURG ORTHO 9.0 STRL STRW (GLOVE) ×1 IMPLANT
GOWN STRL REUS W/ TWL XL LVL3 (GOWN DISPOSABLE) ×2 IMPLANT
KIT BASIN OR (CUSTOM PROCEDURE TRAY) ×1 IMPLANT
KIT TURNOVER KIT B (KITS) ×1 IMPLANT
NS IRRIG 1000ML POUR BTL (IV SOLUTION) ×1 IMPLANT
PACK ORTHO EXTREMITY (CUSTOM PROCEDURE TRAY) ×1 IMPLANT
PAD ARMBOARD POSITIONER FOAM (MISCELLANEOUS) ×2 IMPLANT
STOCKINETTE IMPERVIOUS LG (DRAPES) IMPLANT
SUT ETHILON 2 0 PSLX (SUTURE) ×1 IMPLANT
TOWEL GREEN STERILE (TOWEL DISPOSABLE) ×1 IMPLANT
TUBE CONNECTING 12X1/4 (SUCTIONS) ×1 IMPLANT
YANKAUER SUCT BULB TIP NO VENT (SUCTIONS) ×1 IMPLANT

## 2023-08-23 NOTE — Op Note (Signed)
 08/23/2023  11:06 AM  PATIENT:  Justin Villa    PRE-OPERATIVE DIAGNOSIS:  Osteomyelitis Right 5th Metatarsal  POST-OPERATIVE DIAGNOSIS:  Same  PROCEDURE: Amputation right foot fifth ray. Local tissue transfer for wound closure 7 x 3 cm.  SURGEON:  Nadara Mustard, MD  PHYSICIAN ASSISTANT:None ANESTHESIA:   General  PREOPERATIVE INDICATIONS:  [name] is a  58 y.o. male with a diagnosis of Osteomyelitis Right 5th Metatarsal who failed conservative measures and elected for surgical management.    The risks benefits and alternatives were discussed with the patient preoperatively including but not limited to the risks of infection, bleeding, nerve injury, cardiopulmonary complications, the need for revision surgery, among others, and the patient was willing to proceed.  OPERATIVE IMPLANTS:   * No implants in log *  @ENCIMAGES @  OPERATIVE FINDINGS: Tissue margins were clear.  Wound irrigated with Vashe.  OPERATIVE PROCEDURE: Patient was brought the operating room after undergoing a regional block then a general anesthetic.  After adequate levels anesthesia were obtained patient's right lower extremity was prepped using DuraPrep draped into a sterile field a timeout was called.  A racquet incision was made around the ulcerative tissue carried along the lines of the previous surgical incision.  A oscillating saw was used to perform a ray amputation to the base of the fifth metatarsal.  The ulcerative tissue and fifth ray were resected in 1 block of tissue.  Electrocautery was used hemostasis.  Wound was irrigated with Vashe.  Local tissue transfer was used to close the wound 3 x 7 cm.  Wound closed with 2-0 nylon.  Tissue margins were undermined to allow for tissue advancement.  The wound was covered with a dry sterile dressing patient was extubated taken the PACU in stable condition   DISCHARGE PLANNING:  Antibiotic duration: Preoperative antibiotics  Weightbearing: Touchdown  weightbearing on the right  Pain medication: Prescription for Percocet  Dressing care/ Wound VAC: Dry dressing  Ambulatory devices: Crutches and postoperative shoe  Discharge to: Home.  Follow-up: In the office 1 week post operative.

## 2023-08-23 NOTE — Progress Notes (Addendum)
 Orthopedic Tech Progress Note Patient Details:  Justin Villa Dec 16, 1965 [id number] PACU RN called requesting a PAIR OF CRUTCHES and a POST OP SHOE    Ortho Devices Type of Ortho Device: Crutches, Postop shoe/boot Ortho Device/Splint Location: LLE Ortho Device/Splint Interventions: Ordered, Application, Adjustment   Post Interventions Patient Tolerated: Well Instructions Provided: Care of device  [name] 08/23/2023, 11:55 AM

## 2023-08-23 NOTE — Anesthesia Procedure Notes (Signed)
 Procedure Name: LMA Insertion Date/Time: 08/23/2023 10:41 AM  Performed by: Colbert Coyer, CRNAPre-anesthesia Checklist: Patient identified, Emergency Drugs available, Suction available and Patient being monitored Patient Re-evaluated:Patient Re-evaluated prior to induction Oxygen Delivery Method: Circle System Utilized Preoxygenation: Pre-oxygenation with 100% oxygen Induction Type: IV induction Ventilation: Mask ventilation without difficulty LMA: LMA inserted LMA Size: 4.0 Number of attempts: 1 Placement Confirmation: positive ETCO2 Dental Injury: Teeth and Oropharynx as per pre-operative assessment

## 2023-08-23 NOTE — Interval H&P Note (Signed)
 History and Physical Interval Note:  08/23/2023 10:29 AM  Justin Villa  has presented today for surgery, with the diagnosis of Osteomyelitis Right 5th Metatarsal.  The various methods of treatment have been discussed with the patient and family. After consideration of risks, benefits and other options for treatment, the patient has consented to  Procedure(s) with comments: AMPUTATION, FOOT, RAY (Right) - RIGHT 5TH RAY AMPUTATION as a surgical intervention.  The patient's history has been reviewed, patient examined, no change in status, stable for surgery.  I have reviewed the patient's chart and labs.  Questions were answered to the patient's satisfaction.     Nadara Mustard

## 2023-08-23 NOTE — H&P (Signed)
 Justin Villa is an 58 y.o. male.   Chief Complaint: Chronic infection right fifth metatarsal HPI: Patient is a 58 year old gentleman who is seen for initial evaluation for osteomyelitis fifth metatarsal right foot. Patient is status post debridement of a ulcer fifth toe MTPJ in January 2022   Past Medical History:  Diagnosis Date   CAD (coronary artery disease) 11/27/2015   a. S/p NSTEMI 6/17 >> s/p CABG // b. LHC 6/17: oLAD 50, mLAD 100, D1 70, D2 95, oLCx 95, mLCx 65, OM1 85, OM2 90, mRCA 85, dRCA 75, RPDA 40/50, RPLB2 50    Carotid stenosis    a. Carotid US 6/17: bilat ICA 1-39%   Essential hypertension    GERD (gastroesophageal reflux disease)    History of non-ST elevation myocardial infarction (NSTEMI) 11/2015   Hyperlipidemia    Stroke [facility])     Past Surgical History:  Procedure Laterality Date   BACK SURGERY     BIOPSY  08/25/2022   Procedure: BIOPSY;  Surgeon: Lanelle Bal, DO;  [facility];  Service: Endoscopy;;   CARDIAC CATHETERIZATION N/A 11/26/2015   Procedure: Left Heart Cath and Coronary Angiography;  Surgeon: Lyn Records, MD;  Location: [facility] INVASIVE CV LAB;  Service: Cardiovascular;  Laterality: N/A;   CARPAL TUNNEL RELEASE Left 10/19/2022   Procedure: CARPAL TUNNEL RELEASE;  Surgeon: Vickki Hearing, MD;  Location: [facility] OR[facility];  Service: Orthopedics;  Laterality: Left;   COLONOSCOPY WITH PROPOFOL N/A 08/25/2022   Procedure: COLONOSCOPY WITH PROPOFOL;  Surgeon: Lanelle Bal, DO;  [facility];  Service: Endoscopy;  Laterality: N/A;  10:30am, asa 3   CORONARY ARTERY BYPASS GRAFT N/A 11/27/2015   Procedure: CORONARY ARTERY BYPASS GRAFTING (CABG) x 5 (LIMA to LAD, SVG to DIAGONAL, SVG SEQUENTIALLY to OM1 and OM2, SVG to PDA) with EVH from right leg using greater saphenous vein and mammary.;  Surgeon: Delight Ovens, MD;  Location: [facility] OR;  Service: Open Heart Surgery;  Laterality: N/A;   ESOPHAGOGASTRODUODENOSCOPY (EGD) WITH PROPOFOL N/A  08/25/2022   Procedure: ESOPHAGOGASTRODUODENOSCOPY (EGD) WITH PROPOFOL;  Surgeon: Lanelle Bal, DO;  [facility];  Service: Endoscopy;  Laterality: N/A;   INCISION AND DRAINAGE ABSCESS Right 06/16/2020   Procedure: INCISION AND DRAINAGE ABSCESS RIGHT PINKY TOE (5th toe);  Surgeon: Vickki Hearing, MD;  Location: [facility];  Service: Orthopedics;  Laterality: Right;   INTRAOPERATIVE TRANSESOPHAGEAL ECHOCARDIOGRAM N/A 11/27/2015   Procedure: INTRAOPERATIVE TRANSESOPHAGEAL ECHOCARDIOGRAM;  Surgeon: Delight Ovens, MD;  Location: [facility] OR;  Service: Open Heart Surgery;  Laterality: N/A;   KNEE SURGERY Left    arthroscopy   POLYPECTOMY  08/25/2022   Procedure: POLYPECTOMY;  Surgeon: Lanelle Bal, DO;  [facility];  Service: Endoscopy;;    Family History  Problem Relation Age of Onset   Heart disease Father    Colon cancer Neg Hx    Social History:  reports that he has never smoked. He has never used smokeless tobacco. He reports current alcohol use of about 2.0 standard drinks of alcohol per week. He reports that he does not use drugs.  Allergies: No Known Allergies  No medications prior to admission.    No results found for this or any previous visit (from the past 48 hours). No results found.  Review of Systems  All other systems reviewed and are negative.   Height 6' (1.829 m), weight 102.1 kg. Physical Exam  Patient is alert, oriented, no adenopathy,  well-dressed, normal affect, normal respiratory effort. Examination patient has a strong dorsalis pedis pulse.  He is tender to palpation along the fifth metatarsal right foot.  There is no sausage digit swelling of the toe.  Review of the MRI scan shows edema within the entire metatarsal consistent with osteomyelitis.  There is also edema in the proximal phalanx of the little toe also consistent with osteomyelitis.  Patient's most recent hemoglobin A1c is 5.7 with prediabetes. Assessment/Plan 1. Pain  in right foot   2. Osteomyelitis of fifth toe of right foot (HCC)       Plan: With the extensive osteomyelitis of the fifth metatarsal have recommended proceeding with 1/5 ray amputation.  Patient will be out of town next week and will plan for surgical intervention when he returns.  Risk and benefits were discussed including postoperative care risk of the wound not healing need for additional surgery.  Nadara Mustard, MD 08/23/2023, 6:40 AM

## 2023-08-23 NOTE — Anesthesia Postprocedure Evaluation (Signed)
 Anesthesia Post Note  Patient: Justin Villa  Procedure(s) Performed: AMPUTATION, FOOT, RAY (Right: Fifth Toe)     Patient location during evaluation: PACU Anesthesia Type: General Level of consciousness: awake and alert Pain management: pain level controlled Vital Signs Assessment: post-procedure vital signs reviewed and stable Respiratory status: spontaneous breathing, nonlabored ventilation and respiratory function stable Cardiovascular status: blood pressure returned to baseline and stable Postop Assessment: no apparent nausea or vomiting Anesthetic complications: no   No notable events documented.  Last Vitals:  Vitals:   08/23/23 1130 08/23/23 1145  BP: (!) 134/95 (!) 153/100  Pulse: (!) 57 61  Resp: 12 12  Temp:  36.4 C  SpO2: 94% 96%    Last Pain:  Vitals:   08/23/23 1145  TempSrc:   PainSc: 0-No pain                 Lowella Curb

## 2023-08-23 NOTE — Transfer of Care (Signed)
 Immediate Anesthesia Transfer of Care Note  Patient: Justin Villa  Procedure(s) Performed: AMPUTATION, FOOT, RAY (Right: Fifth Toe)  Patient Location: PACU  Anesthesia Type:General  Level of Consciousness: awake, alert , and oriented  Airway & Oxygen Therapy: Patient Spontanous Breathing and Patient connected to face mask oxygen  Post-op Assessment: Report given to RN and Post -op Vital signs reviewed and stable  Post vital signs: Reviewed and stable  Last Vitals:  Vitals Value Taken Time  BP 138/84 08/23/23 1115  Temp 36.3 C 08/23/23 1115  Pulse 55 08/23/23 1119  Resp 14 08/23/23 1119  SpO2 95 % 08/23/23 1119  Vitals shown include unfiled device data.  Last Pain:  Vitals:   08/23/23 0916  TempSrc:   PainSc: 0-No pain         Complications: No notable events documented.

## 2023-08-23 NOTE — Anesthesia Procedure Notes (Signed)
 Anesthesia Regional Block: Popliteal block   Pre-Anesthetic Checklist: , timeout performed,  Correct Patient, Correct Site, Correct Laterality,  Correct Procedure, Correct Position, site marked,  Risks and benefits discussed,  Surgical consent,  Pre-op evaluation,  At surgeon's request and post-op pain management  Laterality: Right  Prep: chloraprep       Needles:  Injection technique: Single-shot  Needle Type: Stimiplex     Needle Length: 9cm  Needle Gauge: 21     Additional Needles:   Procedures:,,,, ultrasound used (permanent image in chart),,    Narrative:  Start time: 08/23/2023 9:19 AM End time: 08/23/2023 9:24 AM Injection made incrementally with aspirations every 5 mL.  Performed by: Personally  Anesthesiologist: Lowella Curb, MD

## 2023-08-24 ENCOUNTER — Encounter (HOSPITAL_COMMUNITY): Payer: Self-pay | Admitting: Orthopedic Surgery

## 2023-08-30 ENCOUNTER — Ambulatory Visit (INDEPENDENT_AMBULATORY_CARE_PROVIDER_SITE_OTHER): Admitting: Family

## 2023-08-30 ENCOUNTER — Encounter: Payer: Self-pay | Admitting: Family

## 2023-08-30 DIAGNOSIS — M869 Osteomyelitis, unspecified: Secondary | ICD-10-CM

## 2023-08-30 NOTE — Progress Notes (Signed)
 Post-Op Visit Note   Patient: Justin Villa           Date of Birth: 1965/08/16           MRN: [id number] Visit Date: 08/30/2023 PCP: Billie Lade, MD  Chief Complaint: No chief complaint on file.   HPI:  HPI The patient is a 58 year old gentleman who is seen status post fifth ray amputation on the right Ortho Exam On examination right foot the incisions well-approximated sutures there is no gaping or drainage no erythema  Visit Diagnoses: No diagnosis found.  Plan: Begin daily Dial soap cleansing.  Dry dressings.  Return in 2 weeks for suture removal.  Follow-Up Instructions: No follow-ups on file.   Imaging: No results found.  Orders:  No orders of the defined types were placed in this encounter.  No orders of the defined types were placed in this encounter.    PMFS History: Patient Active Problem List   Diagnosis Date Noted  . Acute osteomyelitis of metatarsal bone of right foot (HCC) 08/23/2023  . History of colonic polyps 07/06/2023  . Hypocalcemia 04/10/2023  . Left carpal tunnel syndrome 10/19/2022  . Loud snoring 10/12/2022  . Facial skin lesion 10/07/2022  . Prediabetes 10/07/2022  . Vitamin D insufficiency 10/07/2022  . Barrett's esophagus 10/07/2022  . GERD (gastroesophageal reflux disease) 07/05/2022  . Numbness and tingling in left hand 06/19/2022  . History of diverticulitis 06/19/2022  . Encounter for general adult medical examination with abnormal findings 06/19/2022  . Risk factors for obstructive sleep apnea 06/19/2022  . S/P foot surgery, right I and D 06/16/20  06/18/2020  . Septic arthritis of right foot (HCC)   . Pain in right foot 06/15/2020  . HTN (hypertension) 12/10/2015  . HLD (hyperlipidemia) 12/10/2015  . Carotid stenosis   . CAD (coronary artery disease) 11/27/2015  . NSTEMI (non-ST elevated myocardial infarction) (HCC) 11/26/2015  . Arthropathy, forearm 07/19/2010   Past Medical History:  Diagnosis Date  . CAD  (coronary artery disease) 11/27/2015   a. S/p NSTEMI 6/17 >> s/p CABG // b. LHC 6/17: oLAD 50, mLAD 100, D1 70, D2 95, oLCx 95, mLCx 65, OM1 85, OM2 90, mRCA 85, dRCA 75, RPDA 40/50, RPLB2 50   . Carotid stenosis    a. Carotid US 6/17: bilat ICA 1-39%  . Essential hypertension   . GERD (gastroesophageal reflux disease)   . History of non-ST elevation myocardial infarction (NSTEMI) 11/2015  . Hyperlipidemia   . Stroke [facility])     Family History  Problem Relation Age of Onset  . Heart disease Father   . Colon cancer Neg Hx     Past Surgical History:  Procedure Laterality Date  . AMPUTATION Right 08/23/2023   Procedure: AMPUTATION, FOOT, RAY;  Surgeon: Nadara Mustard, MD;  Location: [facility] OR;  Service: Orthopedics;  Laterality: Right;  RIGHT 5TH RAY AMPUTATION  . BACK SURGERY    . BIOPSY  08/25/2022   Procedure: BIOPSY;  Surgeon: Lanelle Bal, DO;  [facility];  Service: Endoscopy;;  . CARDIAC CATHETERIZATION N/A 11/26/2015   Procedure: Left Heart Cath and Coronary Angiography;  Surgeon: Lyn Records, MD;  Location: [facility] INVASIVE CV LAB;  Service: Cardiovascular;  Laterality: N/A;  . CARPAL TUNNEL RELEASE Left 10/19/2022   Procedure: CARPAL TUNNEL RELEASE;  Surgeon: Vickki Hearing, MD;  Location: [facility];  Service: Orthopedics;  Laterality: Left;  . COLONOSCOPY WITH PROPOFOL N/A 08/25/2022   Procedure: COLONOSCOPY WITH  PROPOFOL;  Surgeon: Lanelle Bal, DO;  [facility];  Service: Endoscopy;  Laterality: N/A;  10:30am, asa 3  . CORONARY ARTERY BYPASS GRAFT N/A 11/27/2015   Procedure: CORONARY ARTERY BYPASS GRAFTING (CABG) x 5 (LIMA to LAD, SVG to DIAGONAL, SVG SEQUENTIALLY to OM1 and OM2, SVG to PDA) with EVH from right leg using greater saphenous vein and mammary.;  Surgeon: Delight Ovens, MD;  Location: [facility] OR;  Service: Open Heart Surgery;  Laterality: N/A;  . ESOPHAGOGASTRODUODENOSCOPY (EGD) WITH PROPOFOL N/A 08/25/2022   Procedure:  ESOPHAGOGASTRODUODENOSCOPY (EGD) WITH PROPOFOL;  Surgeon: Lanelle Bal, DO;  [facility];  Service: Endoscopy;  Laterality: N/A;  . INCISION AND DRAINAGE ABSCESS Right 06/16/2020   Procedure: INCISION AND DRAINAGE ABSCESS RIGHT PINKY TOE (5th toe);  Surgeon: Vickki Hearing, MD;  Location: [facility];  Service: Orthopedics;  Laterality: Right;  . INTRAOPERATIVE TRANSESOPHAGEAL ECHOCARDIOGRAM N/A 11/27/2015   Procedure: INTRAOPERATIVE TRANSESOPHAGEAL ECHOCARDIOGRAM;  Surgeon: Delight Ovens, MD;  Location: [facility] OR;  Service: Open Heart Surgery;  Laterality: N/A;  . KNEE SURGERY Left    arthroscopy  . POLYPECTOMY  08/25/2022   Procedure: POLYPECTOMY;  Surgeon: Lanelle Bal, DO;  [facility];  Service: Endoscopy;;   Social History   Occupational History  . Not on file  Tobacco Use  . Smoking status: Never  . Smokeless tobacco: Never  Vaping Use  . Vaping status: Never Used  Substance and Sexual Activity  . Alcohol use: Yes    Alcohol/week: 2.0 standard drinks of alcohol    Types: 2 Shots of liquor per week    Comment: everyother day  . Drug use: No  . Sexual activity: Yes

## 2023-09-12 ENCOUNTER — Ambulatory Visit (INDEPENDENT_AMBULATORY_CARE_PROVIDER_SITE_OTHER): Admitting: Family

## 2023-09-12 ENCOUNTER — Encounter: Payer: Self-pay | Admitting: Family

## 2023-09-12 DIAGNOSIS — M869 Osteomyelitis, unspecified: Secondary | ICD-10-CM

## 2023-09-12 NOTE — Progress Notes (Signed)
 Post-Op Visit Note   Patient: Justin Villa           Date of Birth: [birth year]           MRN: [id number] Visit Date: 09/12/2023 PCP: Billie Lade, MD  Chief Complaint:  Chief Complaint  Patient presents with   Right Foot - Routine Post Op    08/23/2023 right foot 5th ray amputation     HPI:  HPI The patient is a 58 year old gentleman seen status post right fifth ray amputation he has been full weightbearing in postop shoe has sutures in place feels well has no concerns Ortho Exam On examination right foot the incisions approximately sutures this is healing well there is no active drainage no gaping sutures harvested today without incident.  Visit Diagnoses: No diagnosis found.  Plan: Advance to full weightbearing in regular shoewear sutures harvested without incident  Follow-Up Instructions: No follow-ups on file.   Imaging: No results found.  Orders:  No orders of the defined types were placed in this encounter.  No orders of the defined types were placed in this encounter.    PMFS History: Patient Active Problem List   Diagnosis Date Noted   Acute osteomyelitis of metatarsal bone of right foot (HCC) 08/23/2023   History of colonic polyps 07/06/2023   Hypocalcemia 04/10/2023   Left carpal tunnel syndrome 10/19/2022   Loud snoring 10/12/2022   Facial skin lesion 10/07/2022   Prediabetes 10/07/2022   Vitamin D insufficiency 10/07/2022   Barrett's esophagus 10/07/2022   GERD (gastroesophageal reflux disease) 07/05/2022   Numbness and tingling in left hand 06/19/2022   History of diverticulitis 06/19/2022   Encounter for general adult medical examination with abnormal findings 06/19/2022   Risk factors for obstructive sleep apnea 06/19/2022   S/P foot surgery, right I and D 06/16/20  06/18/2020   Septic arthritis of right foot (HCC)    Pain in right foot 06/15/2020   HTN (hypertension) 12/10/2015   HLD (hyperlipidemia) 12/10/2015   Carotid stenosis     CAD (coronary artery disease) 11/27/2015   NSTEMI (non-ST elevated myocardial infarction) (HCC) 11/26/2015   Arthropathy, forearm 07/19/2010   Past Medical History:  Diagnosis Date   CAD (coronary artery disease) 11/27/2015   a. S/p NSTEMI 6/17 >> s/p CABG // b. LHC 6/17: oLAD 50, mLAD 100, D1 70, D2 95, oLCx 95, mLCx 65, OM1 85, OM2 90, mRCA 85, dRCA 75, RPDA 40/50, RPLB2 50    Carotid stenosis    a. Carotid US 6/17: bilat ICA 1-39%   Essential hypertension    GERD (gastroesophageal reflux disease)    History of non-ST elevation myocardial infarction (NSTEMI) 11/2015   Hyperlipidemia    Stroke [facility])     Family History  Problem Relation Age of Onset   Heart disease Father    Colon cancer Neg Hx     Past Surgical History:  Procedure Laterality Date   AMPUTATION Right 08/23/2023   Procedure: AMPUTATION, FOOT, RAY;  Surgeon: Nadara Mustard, MD;  Location: [facility] OR;  Service: Orthopedics;  Laterality: Right;  RIGHT 5TH RAY AMPUTATION   BACK SURGERY     BIOPSY  08/25/2022   Procedure: BIOPSY;  Surgeon: Lanelle Bal, DO;  [facility];  Service: Endoscopy;;   CARDIAC CATHETERIZATION N/A 11/26/2015   Procedure: Left Heart Cath and Coronary Angiography;  Surgeon: Lyn Records, MD;  Location: [facility] INVASIVE CV LAB;  Service: Cardiovascular;  Laterality: N/A;   CARPAL TUNNEL  RELEASE Left 10/19/2022   Procedure: CARPAL TUNNEL RELEASE;  Surgeon: Vickki Hearing, MD;  Location: [facility];  Service: Orthopedics;  Laterality: Left;   COLONOSCOPY WITH PROPOFOL N/A 08/25/2022   Procedure: COLONOSCOPY WITH PROPOFOL;  Surgeon: Lanelle Bal, DO;  [facility];  Service: Endoscopy;  Laterality: N/A;  10:30am, asa 3   CORONARY ARTERY BYPASS GRAFT N/A 11/27/2015   Procedure: CORONARY ARTERY BYPASS GRAFTING (CABG) x 5 (LIMA to LAD, SVG to DIAGONAL, SVG SEQUENTIALLY to OM1 and OM2, SVG to PDA) with EVH from right leg using greater saphenous vein and mammary.;  Surgeon: Delight Ovens, MD;  Location: [facility] OR;  Service: Open Heart Surgery;  Laterality: N/A;   ESOPHAGOGASTRODUODENOSCOPY (EGD) WITH PROPOFOL N/A 08/25/2022   Procedure: ESOPHAGOGASTRODUODENOSCOPY (EGD) WITH PROPOFOL;  Surgeon: Lanelle Bal, DO;  [facility];  Service: Endoscopy;  Laterality: N/A;   INCISION AND DRAINAGE ABSCESS Right 06/16/2020   Procedure: INCISION AND DRAINAGE ABSCESS RIGHT PINKY TOE (5th toe);  Surgeon: Vickki Hearing, MD;  Location: [facility];  Service: Orthopedics;  Laterality: Right;   INTRAOPERATIVE TRANSESOPHAGEAL ECHOCARDIOGRAM N/A 11/27/2015   Procedure: INTRAOPERATIVE TRANSESOPHAGEAL ECHOCARDIOGRAM;  Surgeon: Delight Ovens, MD;  Location: [facility] OR;  Service: Open Heart Surgery;  Laterality: N/A;   KNEE SURGERY Left    arthroscopy   POLYPECTOMY  08/25/2022   Procedure: POLYPECTOMY;  Surgeon: Lanelle Bal, DO;  [facility];  Service: Endoscopy;;   Social History   Occupational History   Not on file  Tobacco Use   Smoking status: Never   Smokeless tobacco: Never  Vaping Use   Vaping status: Never Used  Substance and Sexual Activity   Alcohol use: Yes    Alcohol/week: 2.0 standard drinks of alcohol    Types: 2 Shots of liquor per week    Comment: everyother day   Drug use: No   Sexual activity: Yes

## 2023-09-13 ENCOUNTER — Encounter: Admitting: Family

## 2023-10-10 ENCOUNTER — Encounter: Admitting: Family

## 2023-10-31 ENCOUNTER — Other Ambulatory Visit: Payer: Self-pay

## 2023-10-31 MED ORDER — LISINOPRIL 40 MG PO TABS: 40.0000 mg | ORAL_TABLET | Freq: Every day | ORAL | 3 refills | Status: DC

## 2023-11-06 ENCOUNTER — Other Ambulatory Visit: Payer: Self-pay | Admitting: Orthopedic Surgery

## 2023-11-06 DIAGNOSIS — G8929 Other chronic pain: Secondary | ICD-10-CM

## 2023-11-16 ENCOUNTER — Other Ambulatory Visit: Payer: Self-pay | Admitting: Orthopedic Surgery

## 2023-11-16 DIAGNOSIS — G8929 Other chronic pain: Secondary | ICD-10-CM

## 2023-11-27 ENCOUNTER — Other Ambulatory Visit (INDEPENDENT_AMBULATORY_CARE_PROVIDER_SITE_OTHER): Payer: Self-pay

## 2023-11-27 ENCOUNTER — Ambulatory Visit (INDEPENDENT_AMBULATORY_CARE_PROVIDER_SITE_OTHER): Admitting: Orthopedic Surgery

## 2023-11-27 ENCOUNTER — Encounter: Payer: Self-pay | Admitting: Orthopedic Surgery

## 2023-11-27 DIAGNOSIS — M25511 Pain in right shoulder: Secondary | ICD-10-CM

## 2023-11-27 DIAGNOSIS — M869 Osteomyelitis, unspecified: Secondary | ICD-10-CM

## 2023-11-27 DIAGNOSIS — G8929 Other chronic pain: Secondary | ICD-10-CM

## 2023-11-27 MED ORDER — METHYLPREDNISOLONE ACETATE 40 MG/ML IJ SUSP
40.0000 mg | INTRAMUSCULAR | Status: AC | PRN
  Administered 2023-11-27: 40 mg via INTRA_ARTICULAR

## 2023-11-27 MED ORDER — LIDOCAINE HCL 1 % IJ SOLN
5.0000 mL | INTRAMUSCULAR | Status: AC | PRN
  Administered 2023-11-27: 5 mL

## 2023-11-27 NOTE — Progress Notes (Signed)
 Office Visit Note   Patient: Justin Villa           Date of Birth: December 07, 1965           MRN: [id number] Visit Date: 11/27/2023              Requested by: Melvenia Manus BRAVO, MD [address] [place],  [place] [address] PCP: Pcp, No  Chief Complaint  Patient presents with   Right Shoulder - Pain      HPI: Patient is a 58 year old gentleman who is seen status post right foot fifth ray amputation over 3 months ago and also chronic right shoulder pain.  We have previously attempted an MRI scan for the right shoulder but this was denied by insurance.  Patient states he has completed physical therapy.  Assessment & Plan: Visit Diagnoses:  1. Chronic right shoulder pain     Plan: Subacromial space was injected in the right shoulder.  Progress with regular shoewear for the right foot.  Follow-Up Instructions: Return in about 4 weeks (around 12/25/2023).   Ortho Exam  Patient is alert, oriented, no adenopathy, well-dressed, normal affect, normal respiratory effort. Examination patient's right foot wound is healed.  Examination of the right shoulder he has pain to palpation of the biceps tendon pain with Neer and Hawkins impingement test.    Imaging: XR Shoulder Right Result Date: 11/27/2023 Seen 2 view radiographs of the right shoulder shows congruent glenohumeral joint with decreased subacromial joint space.  No images are attached to the encounter.  Labs: Lab Results  Component Value Date   HGBA1C 5.7 (H) 06/17/2022   HGBA1C 5.1 11/27/2015   HGBA1C 5.6 11/26/2015   ESRSEDRATE 2 05/08/2023   ESRSEDRATE 14 06/12/2020   CRP <3.0 05/08/2023   CRP 1.7 (H) 06/12/2020   LABURIC 5.7 08/07/2023   REPTSTATUS 06/21/2020 FINAL 06/16/2020   REPTSTATUS 06/21/2020 FINAL 06/16/2020   GRAMSTAIN  06/16/2020    FEW WBC PRESENT,BOTH PMN AND MONONUCLEAR NO ORGANISMS SEEN    GRAMSTAIN  06/16/2020    RARE WBC PRESENT, PREDOMINANTLY PMN NO ORGANISMS SEEN    CULT  06/16/2020     RARE PSEUDOMONAS AERUGINOSA NO ANAEROBES ISOLATED Performed at [facility] Lab, [address] [place], [place] [address]    CULT  06/16/2020    RARE PSEUDOMONAS AERUGINOSA NO ANAEROBES ISOLATED Performed at [facility] Lab, [address], [place], [place] [address]    LABORGA PSEUDOMONAS AERUGINOSA 06/16/2020   LABORGA PSEUDOMONAS AERUGINOSA 06/16/2020     Lab Results  Component Value Date   ALBUMIN  3.8 08/23/2023   ALBUMIN  4.1 01/29/2023   ALBUMIN  4.2 06/17/2022    Lab Results  Component Value Date   MG 2.5 (H) 11/28/2015   MG 2.4 11/28/2015   MG 2.6 (H) 11/27/2015   Lab Results  Component Value Date   VD25OH 26.8 (L) 06/17/2022    No results found for: PREALBUMIN    Latest Ref Rng & Units 08/23/2023    8:20 AM 05/08/2023    3:12 PM 01/29/2023   10:29 AM  CBC EXTENDED  WBC 4.0 - 10.5 K/uL 6.9  7.9  7.2   RBC 4.22 - 5.81 MIL/uL 6.48  5.91  5.46   Hemoglobin 13.0 - 17.0 g/dL 82.2  83.0  84.4   HCT 39.0 - 52.0 % 53.1  51.1  45.7   Platelets 150 - 400 K/uL 255  306  319   NEUT# 1,500 - 7,800 cells/uL  4,329  4.4   Lymph# 0.7 - 4.0 K/uL   1.9      There is no height or weight on file to calculate BMI.  Orders:  Orders Placed This Encounter  Procedures   XR Shoulder Right   No orders of the defined types were placed in this encounter.    Procedures: Large Joint Inj: R subacromial bursa on 11/27/2023 5:06 PM Indications: diagnostic evaluation and pain Details: 22 G 1.5 in needle, posterior approach  Arthrogram: No  Medications: 5 mL lidocaine  1 %; 40 mg methylPREDNISolone  acetate 40 MG/ML Outcome: tolerated well, no immediate complications Procedure, treatment alternatives, risks and benefits explained, specific risks discussed. Consent was given by the patient. Immediately prior to procedure a time out was called to verify the correct patient, procedure, equipment, support staff and site/side marked as required. Patient was prepped and draped in  the usual sterile fashion.      Clinical Data: No additional findings.  ROS:  All other systems negative, except as noted in the HPI. Review of Systems  Objective: Vital Signs: There were no vitals taken for this visit.  Specialty Comments:  No specialty comments available.  PMFS History: Patient Active Problem List   Diagnosis Date Noted   Acute osteomyelitis of metatarsal bone of right foot (HCC) 08/23/2023   History of colonic polyps 07/06/2023   Hypocalcemia 04/10/2023   Left carpal tunnel syndrome 10/19/2022   Loud snoring 10/12/2022   Facial skin lesion 10/07/2022   Prediabetes 10/07/2022   Vitamin D  insufficiency 10/07/2022   Barrett's esophagus 10/07/2022   GERD (gastroesophageal reflux disease) 07/05/2022   Numbness and tingling in left hand 06/19/2022   History of diverticulitis 06/19/2022   Encounter for general adult medical examination with abnormal findings 06/19/2022   Risk factors for obstructive sleep apnea 06/19/2022   S/P foot surgery, right I and D 06/16/20  06/18/2020   Septic arthritis of right foot (HCC)    Pain in right foot 06/15/2020   HTN (hypertension) 12/10/2015   HLD (hyperlipidemia) 12/10/2015   Carotid stenosis    CAD (coronary artery disease) 11/27/2015   NSTEMI (non-ST elevated myocardial infarction) (HCC) 11/26/2015   Arthropathy, forearm 07/19/2010   Past Medical History:  Diagnosis Date   CAD (coronary artery disease) 11/27/2015   a. S/p NSTEMI 6/17 >> s/p CABG // b. LHC 6/17: oLAD 50, mLAD 100, D1 70, D2 95, oLCx 95, mLCx 65, OM1 85, OM2 90, mRCA 85, dRCA 75, RPDA 40/50, RPLB2 50    Carotid stenosis    a. Carotid US  6/17: bilat ICA 1-39%   Essential hypertension    GERD (gastroesophageal reflux disease)    History of non-ST elevation myocardial infarction (NSTEMI) 11/2015   Hyperlipidemia    Stroke [facility])     Family History  Problem Relation Age of Onset   Heart disease Father    Colon cancer Neg Hx     Past Surgical  History:  Procedure Laterality Date   AMPUTATION Right 08/23/2023   Procedure: AMPUTATION, FOOT, RAY;  Surgeon: Harden Jerona GAILS, MD;  Location: [facility] OR;  Service: Orthopedics;  Laterality: Right;  RIGHT 5TH RAY AMPUTATION   BACK SURGERY     BIOPSY  08/25/2022   Procedure: BIOPSY;  Surgeon: Cindie Carlin POUR, DO;  Location: [facility] END[facility] SUITE;  Service: Endoscopy;;   CARDIAC CATHETERIZATION N/A 11/26/2015   Procedure: Left Heart Cath and Coronary Angiography;  Surgeon: Victory LELON Sharps, MD;  Location: [facility] INVASIVE CV LAB;  Service: Cardiovascular;  Laterality: N/A;   CARPAL TUNNEL RELEASE Left 10/19/2022   Procedure: CARPAL TUNNEL RELEASE;  Surgeon: Margrette Taft BRAVO, MD;  Location: [facility] ORS;  Service: Orthopedics;  Laterality: Left;   COLONOSCOPY WITH PROPOFOL  N/A 08/25/2022   Procedure: COLONOSCOPY WITH PROPOFOL ;  Surgeon: Cindie Carlin POUR, DO;  [facility]: [facility];  Service: Endoscopy;  Laterality: N/A;  10:30am, asa 3   CORONARY ARTERY BYPASS GRAFT N/A 11/27/2015   Procedure: CORONARY ARTERY BYPASS GRAFTING (CABG) x 5 (LIMA to LAD, SVG to DIAGONAL, SVG SEQUENTIALLY to OM1 and OM2, SVG to PDA) with EVH from right leg using greater saphenous vein and mammary.;  Surgeon: Dallas KATHEE Jude, MD;  Location: [facility] OR;  Service: Open Heart Surgery;  Laterality: N/A;   ESOPHAGOGASTRODUODENOSCOPY (EGD) WITH PROPOFOL  N/A 08/25/2022   Procedure: ESOPHAGOGASTRODUODENOSCOPY (EGD) WITH PROPOFOL ;  Surgeon: Cindie Carlin POUR, DO;  [facility];  Service: Endoscopy;  Laterality: N/A;   INCISION AND DRAINAGE ABSCESS Right 06/16/2020   Procedure: INCISION AND DRAINAGE ABSCESS RIGHT PINKY TOE (5th toe);  Surgeon: Margrette Taft BRAVO, MD;  Location: [facility] ORS;  Service: Orthopedics;  Laterality: Right;   INTRAOPERATIVE TRANSESOPHAGEAL ECHOCARDIOGRAM N/A 11/27/2015   Procedure: INTRAOPERATIVE TRANSESOPHAGEAL ECHOCARDIOGRAM;  Surgeon: Dallas KATHEE Jude, MD;  Location: [facility] OR;  Service: Open Heart Surgery;  Laterality: N/A;    KNEE SURGERY Left    arthroscopy   POLYPECTOMY  08/25/2022   Procedure: POLYPECTOMY;  Surgeon: Cindie Carlin POUR, DO;  [facility];  Service: Endoscopy;;   Social History   Occupational History   Not on file  Tobacco Use   Smoking status: Never   Smokeless tobacco: Never  Vaping Use   Vaping status: Never Used  Substance and Sexual Activity   Alcohol use: Yes    Alcohol/week: 2.0 standard drinks of alcohol    Types: 2 Shots of liquor per week    Comment: everyother day   Drug use: No   Sexual activity: Yes

## 2023-12-25 ENCOUNTER — Encounter: Payer: Self-pay | Admitting: Orthopedic Surgery

## 2023-12-25 ENCOUNTER — Ambulatory Visit (INDEPENDENT_AMBULATORY_CARE_PROVIDER_SITE_OTHER): Admitting: Orthopedic Surgery

## 2023-12-25 DIAGNOSIS — M75111 Incomplete rotator cuff tear or rupture of right shoulder, not specified as traumatic: Secondary | ICD-10-CM

## 2023-12-25 NOTE — Progress Notes (Addendum)
 Office Visit Note   Patient: Justin Villa           Date of Birth: [birth year]           MRN: [id number] Visit Date: 12/25/2023              Requested by: No referring provider defined for this encounter. PCP: Pcp, No  Chief Complaint  Patient presents with   Right Shoulder - Follow-up    S/p injection 11/27/2023      HPI: Patient is a 58 year old gentleman who presents in follow-up status post subacromial injection for rotator cuff pathology right shoulder.  Patient states the injection did not provide relief.  He states he still has pain with internal and external rotation as well as abductioN.  Patient states that he has decreased range of motion overhead and behind himself.  Patient states he has pain exacerbated by coughing and sneezing.  Assessment & Plan: Visit Diagnoses:  1. Nontraumatic incomplete tear of right rotator cuff     Plan: Will request an MRI scan to further evaluate intra-articular and rotator cuff pathology.  Patient will call for follow-up after the MRI scan is obtained.  Follow-Up Instructions: Return if symptoms worsen or fail to improve.   Ortho Exam  Patient is alert, oriented, no adenopathy, well-dressed, normal affect, normal respiratory effort. Examination patient has full active range of motion.  He has pain with internal rotation and abduction and flexion.  Patient has pain with Neer and Hawkins impingement test.  He has pain to palpation over the biceps tendon.    Imaging: No results found. No images are attached to the encounter.  Labs: Lab Results  Component Value Date   HGBA1C 5.7 (H) 06/17/2022   HGBA1C 5.1 11/27/2015   HGBA1C 5.6 11/26/2015   ESRSEDRATE 2 05/08/2023   ESRSEDRATE 14 06/12/2020   CRP <3.0 05/08/2023   CRP 1.7 (H) 06/12/2020   LABURIC 5.7 08/07/2023   REPTSTATUS 06/21/2020 FINAL 06/16/2020   REPTSTATUS 06/21/2020 FINAL 06/16/2020   GRAMSTAIN  06/16/2020    FEW WBC PRESENT,BOTH PMN AND MONONUCLEAR NO  ORGANISMS SEEN    GRAMSTAIN  06/16/2020    RARE WBC PRESENT, PREDOMINANTLY PMN NO ORGANISMS SEEN    CULT  06/16/2020    RARE PSEUDOMONAS AERUGINOSA NO ANAEROBES ISOLATED Performed at [facility] Lab, [address] [place], [place] [address]    CULT  06/16/2020    RARE PSEUDOMONAS AERUGINOSA NO ANAEROBES ISOLATED Performed at [facility] Lab, [address] [place], [place] [address]    LABORGA PSEUDOMONAS AERUGINOSA 06/16/2020   LABORGA PSEUDOMONAS AERUGINOSA 06/16/2020     Lab Results  Component Value Date   ALBUMIN  3.8 08/23/2023   ALBUMIN  4.1 01/29/2023   ALBUMIN  4.2 06/17/2022    Lab Results  Component Value Date   MG 2.5 (H) 11/28/2015   MG 2.4 11/28/2015   MG 2.6 (H) 11/27/2015   Lab Results  Component Value Date   VD25OH 26.8 (L) 06/17/2022    No results found for: PREALBUMIN    Latest Ref Rng & Units 08/23/2023    8:20 AM 05/08/2023    3:12 PM 01/29/2023   10:29 AM  CBC EXTENDED  WBC 4.0 - 10.5 K/uL 6.9  7.9  7.2   RBC 4.22 - 5.81 MIL/uL 6.48  5.91  5.46   Hemoglobin 13.0 - 17.0 g/dL 82.2  83.0  84.4   HCT 39.0 - 52.0 % 53.1  51.1  45.7  Platelets 150 - 400 K/uL 255  306  319   NEUT# 1,500 - 7,800 cells/uL  4,329  4.4   Lymph# 0.7 - 4.0 K/uL   1.9      There is no height or weight on file to calculate BMI.  Orders:  No orders of the defined types were placed in this encounter.  No orders of the defined types were placed in this encounter.    Procedures: No procedures performed  Clinical Data: No additional findings.  ROS:  All other systems negative, except as noted in the HPI. Review of Systems  Objective: Vital Signs: There were no vitals taken for this visit.  Specialty Comments:  No specialty comments available.  PMFS History: Patient Active Problem List   Diagnosis Date Noted   Acute osteomyelitis of metatarsal bone of right foot (HCC) 08/23/2023   History of colonic polyps 07/06/2023   Hypocalcemia 04/10/2023    Left carpal tunnel syndrome 10/19/2022   Loud snoring 10/12/2022   Facial skin lesion 10/07/2022   Prediabetes 10/07/2022   Vitamin D  insufficiency 10/07/2022   Barrett's esophagus 10/07/2022   GERD (gastroesophageal reflux disease) 07/05/2022   Numbness and tingling in left hand 06/19/2022   History of diverticulitis 06/19/2022   Encounter for general adult medical examination with abnormal findings 06/19/2022   Risk factors for obstructive sleep apnea 06/19/2022   S/P foot surgery, right I and D 06/16/20  06/18/2020   Septic arthritis of right foot (HCC)    Pain in right foot 06/15/2020   HTN (hypertension) 12/10/2015   HLD (hyperlipidemia) 12/10/2015   Carotid stenosis    CAD (coronary artery disease) 11/27/2015   NSTEMI (non-ST elevated myocardial infarction) (HCC) 11/26/2015   Arthropathy, forearm 07/19/2010   Past Medical History:  Diagnosis Date   CAD (coronary artery disease) 11/27/2015   a. S/p NSTEMI 6/17 >> s/p CABG // b. LHC 6/17: oLAD 50, mLAD 100, D1 70, D2 95, oLCx 95, mLCx 65, OM1 85, OM2 90, mRCA 85, dRCA 75, RPDA 40/50, RPLB2 50    Carotid stenosis    a. Carotid US  6/17: bilat ICA 1-39%   Essential hypertension    GERD (gastroesophageal reflux disease)    History of non-ST elevation myocardial infarction (NSTEMI) 11/2015   Hyperlipidemia    Stroke [facility])     Family History  Problem Relation Age of Onset   Heart disease Father    Colon cancer Neg Hx     Past Surgical History:  Procedure Laterality Date   AMPUTATION Right 08/23/2023   Procedure: AMPUTATION, FOOT, RAY;  Surgeon: Harden Jerona GAILS, MD;  Location: [facility] OR;  Service: Orthopedics;  Laterality: Right;  RIGHT 5TH RAY AMPUTATION   BACK SURGERY     BIOPSY  08/25/2022   Procedure: BIOPSY;  Surgeon: Cindie Carlin POUR, DO;  [facility];  Service: Endoscopy;;   CARDIAC CATHETERIZATION N/A 11/26/2015   Procedure: Left Heart Cath and Coronary Angiography;  Surgeon: Victory LELON Sharps, MD;  Location: [facility]  INVASIVE CV LAB;  Service: Cardiovascular;  Laterality: N/A;   CARPAL TUNNEL RELEASE Left 10/19/2022   Procedure: CARPAL TUNNEL RELEASE;  Surgeon: Margrette Taft BRAVO, MD;  Location: [facility] ORS;  Service: Orthopedics;  Laterality: Left;   COLONOSCOPY WITH PROPOFOL  N/A 08/25/2022   Procedure: COLONOSCOPY WITH PROPOFOL ;  Surgeon: Cindie Carlin POUR, DO;  [facility];  Service: Endoscopy;  Laterality: N/A;  10:30am, asa 3   CORONARY ARTERY BYPASS GRAFT N/A 11/27/2015   Procedure: CORONARY  ARTERY BYPASS GRAFTING (CABG) x 5 (LIMA to LAD, SVG to DIAGONAL, SVG SEQUENTIALLY to OM1 and OM2, SVG to PDA) with EVH from right leg using greater saphenous vein and mammary.;  Surgeon: Dallas KATHEE Jude, MD;  Location: [facility] OR;  Service: Open Heart Surgery;  Laterality: N/A;   ESOPHAGOGASTRODUODENOSCOPY (EGD) WITH PROPOFOL  N/A 08/25/2022   Procedure: ESOPHAGOGASTRODUODENOSCOPY (EGD) WITH PROPOFOL ;  Surgeon: Cindie Carlin POUR, DO;  [facility];  Service: Endoscopy;  Laterality: N/A;   INCISION AND DRAINAGE ABSCESS Right 06/16/2020   Procedure: INCISION AND DRAINAGE ABSCESS RIGHT PINKY TOE (5th toe);  Surgeon: Margrette Taft BRAVO, MD;  Location: [facility];  Service: Orthopedics;  Laterality: Right;   INTRAOPERATIVE TRANSESOPHAGEAL ECHOCARDIOGRAM N/A 11/27/2015   Procedure: INTRAOPERATIVE TRANSESOPHAGEAL ECHOCARDIOGRAM;  Surgeon: Dallas KATHEE Jude, MD;  Location: [facility] OR;  Service: Open Heart Surgery;  Laterality: N/A;   KNEE SURGERY Left    arthroscopy   POLYPECTOMY  08/25/2022   Procedure: POLYPECTOMY;  Surgeon: Cindie Carlin POUR, DO;  [facility];  Service: Endoscopy;;   Social History   Occupational History   Not on file  Tobacco Use   Smoking status: Never   Smokeless tobacco: Never  Vaping Use   Vaping status: Never Used  Substance and Sexual Activity   Alcohol use: Yes    Alcohol/week: 2.0 standard drinks of alcohol    Types: 2 Shots of liquor per week    Comment: everyother day    Drug use: No   Sexual activity: Yes

## 2023-12-29 ENCOUNTER — Telehealth (HOSPITAL_BASED_OUTPATIENT_CLINIC_OR_DEPARTMENT_OTHER): Payer: Self-pay | Admitting: Orthopedic Surgery

## 2023-12-29 NOTE — Telephone Encounter (Signed)
 Called patient to let him know that MRI was cx due to insurance verification

## 2024-01-01 ENCOUNTER — Ambulatory Visit (HOSPITAL_COMMUNITY)

## 2024-01-02 ENCOUNTER — Ambulatory Visit

## 2024-01-05 ENCOUNTER — Ambulatory Visit: Payer: Self-pay | Admitting: Nurse Practitioner

## 2024-01-16 NOTE — Telephone Encounter (Signed)
 Copied from CRM [id number]. Topic: General - Other >> Jan 16, 2024  9:37 AM Tobias CROME wrote: Reason for CRM: Patient states he received a letter for no showing appointment on 01/02/24. Patient states he did not miss appointment and it was rescheduled. Patient states he will not be paying the $50 no show fee as it was not his fault.   Requesting callback, [phone]

## 2024-01-16 NOTE — Telephone Encounter (Signed)
 E2C2 did not cancel patient's appointment. No Show removed and chart has been updated.

## 2024-01-17 ENCOUNTER — Ambulatory Visit: Admitting: Family Medicine

## 2024-01-17 ENCOUNTER — Ambulatory Visit: Payer: Self-pay | Admitting: *Deleted

## 2024-01-17 NOTE — Telephone Encounter (Signed)
 FYI Only or Action Required?: Action required by provider: request for appointment.  Patient was last seen in primary care on 04/10/2023 by Justin Manus BRAVO, MD.  Called Nurse Triage reporting Dizziness.  Symptoms began several months ago.  Interventions attempted: Nothing.  Symptoms are: unchanged.  Triage Disposition: See PCP When Office is Open (Within 3 Days)  Patient/caregiver understands and will follow disposition?: UnsureReason for Disposition  [1] MODERATE dizziness (e.g., interferes with normal activities) AND [2] has been evaluated by doctor (or NP/PA) for this  Answer Assessment - Initial Assessment Questions Pt missed appt today regarding dizziness. Pt stated it hits at lunch time daily. RN offered appt today at 1600 but pt out of town.Next available appt is 9/10. No appt made. Please advise and call pt with new appt.         1. DESCRIPTION: Describe your dizziness.     Pass out like low blood sugar 2. LIGHTHEADED: Do you feel lightheaded? (e.g., somewhat faint, woozy, weak upon standing)     woozy 3. VERTIGO: Do you feel like either you or the room is spinning or tilting? (i.e., vertigo)     neither 4. SEVERITY: How bad is it?  Do you feel like you are going to faint? Can you stand and walk?     denies 5. ONSET:  When did the dizziness begin?     2 months 6. AGGRAVATING FACTORS: Does anything make it worse? (e.g., standing, change in head position)     Lunch time  7. HEART RATE: Can you tell me your heart rate? How many beats in 15 seconds?  (Note: Not all patients can do this.)       na 8. CAUSE: What do you think is causing the dizziness? (e.g., decreased fluids or food, diarrhea, emotional distress, heat exposure, new medicine, sudden standing, vomiting; unknown)     Not sure 9. RECURRENT SYMPTOM: Have you had dizziness before? If Yes, ask: When was the last time? What happened that time?     na 10. OTHER SYMPTOMS: Do you have  any other symptoms? (e.g., fever, chest pain, vomiting, diarrhea, bleeding)       fatigue  Protocols used: Dizziness - Lightheadedness-A-AH

## 2024-01-17 NOTE — Telephone Encounter (Signed)
 Copied from CRM [id number]. Topic: Clinical - Red Word Triage >> Jan 17, 2024 11:02 AM Turkey B wrote: [facility] that prompted transfer to Nurse Triage: patient has dizziness and lightheadedness going on   Call dropped/disconnected during transfer- unable to get number to go through- multiple attempts.

## 2024-01-18 ENCOUNTER — Ambulatory Visit (HOSPITAL_COMMUNITY)
Admission: RE | Admit: 2024-01-18 | Discharge: 2024-01-18 | Disposition: A | Discharge status: Home / Self Care | Source: Ambulatory Visit | Attending: Orthopedic Surgery | Admitting: Orthopedic Surgery

## 2024-01-18 DIAGNOSIS — M75111 Incomplete rotator cuff tear or rupture of right shoulder, not specified as traumatic: Secondary | ICD-10-CM | POA: Insufficient documentation

## 2024-01-18 NOTE — Telephone Encounter (Signed)
 FYI Scheduled with Neale Carpen for Friday, 8/15

## 2024-01-18 NOTE — Telephone Encounter (Signed)
 Will need appt with available provider

## 2024-01-19 ENCOUNTER — Encounter: Payer: Self-pay | Admitting: Nurse Practitioner

## 2024-01-19 ENCOUNTER — Ambulatory Visit (INDEPENDENT_AMBULATORY_CARE_PROVIDER_SITE_OTHER): Admitting: Nurse Practitioner

## 2024-01-19 VITALS — BP 146/85 | HR 58 | Ht 72.0 in | Wt 241.0 lb

## 2024-01-19 DIAGNOSIS — R5383 Other fatigue: Secondary | ICD-10-CM | POA: Diagnosis not present

## 2024-01-19 DIAGNOSIS — I10 Essential (primary) hypertension: Secondary | ICD-10-CM

## 2024-01-19 DIAGNOSIS — R42 Dizziness and giddiness: Secondary | ICD-10-CM

## 2024-01-19 NOTE — Patient Instructions (Signed)
 1) Labs today 2) Overall pt does not need meclizine 3) Will call with lab results on Monday 4) Recommend BP monitoring with Omron cuff 5) If labs are normal, will have pt keep BP log x 10 days and come back in for observance

## 2024-01-19 NOTE — Progress Notes (Addendum)
 Established Patient Office Visit  Subjective:  Patient ID: Justin Villa, male    DOB: 1965/08/04  Age: 58 y.o. MRN: [id number]  Chief Complaint  Patient presents with   Dizziness    Patient here today for complaint of dizziness x 3 weeks.  Patient feels lightheaded when standing.  No visual disturbances.  No headache.  Consumes 1 cup of caffeinated coffee in the AM.  Does consume water  throughout the day.  Patient does not consume too much sugar.    Dizziness    No other concerns at this time.   Past Medical History:  Diagnosis Date   CAD (coronary artery disease) 11/27/2015   a. S/p NSTEMI 6/17 >> s/p CABG // b. LHC 6/17: oLAD 50, mLAD 100, D1 70, D2 95, oLCx 95, mLCx 65, OM1 85, OM2 90, mRCA 85, dRCA 75, RPDA 40/50, RPLB2 50    Carotid stenosis    a. Carotid US  6/17: bilat ICA 1-39%   Essential hypertension    GERD (gastroesophageal reflux disease)    History of non-ST elevation myocardial infarction (NSTEMI) 11/2015   Hyperlipidemia    Stroke [facility])     Past Surgical History:  Procedure Laterality Date   AMPUTATION Right 08/23/2023   Procedure: AMPUTATION, FOOT, RAY;  Surgeon: Harden Jerona GAILS, MD;  Location: [facility] OR;  Service: Orthopedics;  Laterality: Right;  RIGHT 5TH RAY AMPUTATION   BACK SURGERY     BIOPSY  08/25/2022   Procedure: BIOPSY;  Surgeon: Cindie Carlin POUR, DO;  Location: [facility] ENDO SUITE;  Service: Endoscopy;;   CARDIAC CATHETERIZATION N/A 11/26/2015   Procedure: Left Heart Cath and Coronary Angiography;  Surgeon: Victory LELON Sharps, MD;  Location: [facility] INVASIVE CV LAB;  Service: Cardiovascular;  Laterality: N/A;   CARPAL TUNNEL RELEASE Left 10/19/2022   Procedure: CARPAL TUNNEL RELEASE;  Surgeon: Margrette Taft BRAVO, MD;  Location: [facility] ORS;  Service: Orthopedics;  Laterality: Left;   COLONOSCOPY WITH PROPOFOL  N/A 08/25/2022   Procedure: COLONOSCOPY WITH PROPOFOL ;  Surgeon: Cindie Carlin POUR, DO;  Location: [facility] ENDO SUITE;  Service: Endoscopy;  Laterality: N/A;  10:30am,  asa 3   CORONARY ARTERY BYPASS GRAFT N/A 11/27/2015   Procedure: CORONARY ARTERY BYPASS GRAFTING (CABG) x 5 (LIMA to LAD, SVG to DIAGONAL, SVG SEQUENTIALLY to OM1 and OM2, SVG to PDA) with EVH from right leg using greater saphenous vein and mammary.;  Surgeon: Dallas KATHEE Jude, MD;  Location: [facility] OR;  Service: Open Heart Surgery;  Laterality: N/A;   ESOPHAGOGASTRODUODENOSCOPY (EGD) WITH PROPOFOL  N/A 08/25/2022   Procedure: ESOPHAGOGASTRODUODENOSCOPY (EGD) WITH PROPOFOL ;  Surgeon: Cindie Carlin POUR, DO;  Location: [facility] SUITE;  Service: Endoscopy;  Laterality: N/A;   INCISION AND DRAINAGE ABSCESS Right 06/16/2020   Procedure: INCISION AND DRAINAGE ABSCESS RIGHT PINKY TOE (5th toe);  Surgeon: Margrette Taft BRAVO, MD;  Location: [facility] ORS;  Service: Orthopedics;  Laterality: Right;   INTRAOPERATIVE TRANSESOPHAGEAL ECHOCARDIOGRAM N/A 11/27/2015   Procedure: INTRAOPERATIVE TRANSESOPHAGEAL ECHOCARDIOGRAM;  Surgeon: Dallas KATHEE Jude, MD;  Location: [facility] OR;  Service: Open Heart Surgery;  Laterality: N/A;   KNEE SURGERY Left    arthroscopy   POLYPECTOMY  08/25/2022   Procedure: POLYPECTOMY;  Surgeon: Cindie Carlin POUR, DO;  Location: [facility];  Service: Endoscopy;;    Social History   Socioeconomic History   Marital status: Single    Spouse name: Not on file   Number of children: Not on file   Years of education: Not on file   Highest education  level: Not on file  Occupational History   Not on file  Tobacco Use   Smoking status: Never   Smokeless tobacco: Never  Vaping Use   Vaping status: Never Used  Substance and Sexual Activity   Alcohol use: Yes    Alcohol/week: 2.0 standard drinks of alcohol    Types: 2 Shots of liquor per week    Comment: everyother day   Drug use: No   Sexual activity: Yes  Other Topics Concern   Not on file  Social History Narrative   Not on file   Social Drivers of Health   Financial Resource Strain: Not on file  Food Insecurity: Not on file   Transportation Needs: Not on file  Physical Activity: Not on file  Stress: Not on file  Social Connections: Not on file  Intimate Partner Violence: Not on file    Family History  Problem Relation Age of Onset   Heart disease Father    Colon cancer Neg Hx     No Known Allergies  Outpatient Medications Prior to Visit  Medication Sig   aspirin  EC 81 MG EC tablet Take 1 tablet (81 mg total) by mouth daily.   atorvastatin  (LIPITOR ) 40 MG tablet Take 1 tablet (40 mg total) by mouth daily.   Cholecalciferol (VITAMIN D ) 50 MCG (2000 UT) CAPS Take 2,000 Units by mouth daily.   lisinopril  (ZESTRIL ) 40 MG tablet Take 1 tablet (40 mg total) by mouth daily.   metoprolol  succinate (TOPROL -XL) 25 MG 24 hr tablet TAKE 1 TABLET BY MOUTH DAILY   nitroGLYCERIN  (NITROSTAT ) 0.4 MG SL tablet Place 1 tablet (0.4 mg total) under the tongue every 5 (five) minutes x 3 doses as needed for chest pain (if no relief after 2nd dose, proceed to ED or call 911).   oxyCODONE -acetaminophen  (PERCOCET/ROXICET) 5-325 MG tablet Take 1 tablet by mouth every 4 (four) hours as needed.   pantoprazole  (PROTONIX ) 40 MG tablet TAKE 1 TABLET BY MOUTH 2 TIMES A DAY BEFORE A MEAL   [DISCONTINUED] famotidine  (PEPCID ) 20 MG tablet Take 1 tablet (20 mg total) by mouth 2 (two) times daily.   No facility-administered medications prior to visit.    Review of Systems  Neurological:  Positive for dizziness.       Objective:   BP (!) 146/85   Pulse (!) 58   Ht 6' (1.829 m)   Wt 241 lb (109.3 kg)   BMI 32.69 kg/m   Vitals:   01/19/24 0827  BP: (!) 146/85  Pulse: (!) 58  Height: 6' (1.829 m)  Weight: 241 lb (109.3 kg)  BMI (Calculated): 32.68    Physical Exam Vitals and nursing note reviewed.  Constitutional:      Appearance: Normal appearance.  HENT:     Head: Normocephalic.     Right Ear: Tympanic membrane, ear canal and external ear normal.     Left Ear: Tympanic membrane, ear canal and external ear normal.      Nose: Nose normal.     Mouth/Throat:     Mouth: Mucous membranes are moist.  Eyes:     Pupils: Pupils are equal, round, and reactive to light.  Cardiovascular:     Rate and Rhythm: Normal rate and regular rhythm.     Pulses: Normal pulses.     Heart sounds: Normal heart sounds.  Pulmonary:     Effort: Pulmonary effort is normal.     Breath sounds: Normal breath sounds.  Musculoskeletal:  General: Normal range of motion.     Cervical back: Normal range of motion and neck supple.  Skin:    General: Skin is warm and dry.  Neurological:     Mental Status: He is alert and oriented to person, place, and time.     Comments: Neg Romberg's today  Psychiatric:        Mood and Affect: Mood normal.        Behavior: Behavior normal.      No results found for any visits on 01/19/24.  No results found for this or any previous visit (from the past 2160 hours).    Assessment & Plan:  1) Dizziness/vertigo - CBC, CMP, TSH.  Monitor BP at home once daily x 10 days 2)  Will contact patient with lab results on Monday, if normal, will need follow up appt in 10 days   Problem List Items Addressed This Visit   None   No follow-ups on file.   Total time spent: 25 minutes  Neale Carpen, NP  01/19/2024   This document may have been prepared by [facility] Voice Recognition software and as such may include unintentional dictation errors.

## 2024-01-20 LAB — CMP14+EGFR
ALT: 46 IU/L — ABNORMAL HIGH (ref 0–44)
AST: 31 IU/L (ref 0–40)
Albumin: 4.5 g/dL (ref 3.8–4.9)
Alkaline Phosphatase: 63 IU/L (ref 44–121)
BUN/Creatinine Ratio: 15 (ref 10–24)
BUN: 17 mg/dL (ref 8–27)
Bilirubin Total: 1.5 mg/dL — ABNORMAL HIGH (ref 0.0–1.2)
CO2: 19 mmol/L — ABNORMAL LOW (ref 20–29)
Calcium: 9.3 mg/dL (ref 8.6–10.2)
Chloride: 102 mmol/L (ref 96–106)
Creatinine, Ser: 1.11 mg/dL (ref 0.76–1.27)
Globulin, Total: 2.2 g/dL (ref 1.5–4.5)
Glucose: 85 mg/dL (ref 70–99)
Potassium: 4.5 mmol/L (ref 3.5–5.2)
Sodium: 137 mmol/L (ref 134–144)
Total Protein: 6.7 g/dL (ref 6.0–8.5)
eGFR: 76 mL/min/1.73 (ref >59)

## 2024-01-20 LAB — CBC WITH DIFFERENTIAL/PLATELET
Basophils Absolute: 0.1 x10E3/uL (ref 0.0–0.2)
Basos: 1 %
EOS (ABSOLUTE): 0.3 x10E3/uL (ref 0.0–0.4)
Eos: 5 %
Hematocrit: 50.3 % (ref 37.5–51.0)
Hemoglobin: 16.8 g/dL (ref 13.0–17.7)
Immature Grans (Abs): 0 x10E3/uL (ref 0.0–0.1)
Immature Granulocytes: 0 %
Lymphocytes Absolute: 2.1 x10E3/uL (ref 0.7–3.1)
Lymphs: 31 %
MCH: 29.7 pg (ref 26.6–33.0)
MCHC: 33.4 g/dL (ref 31.5–35.7)
MCV: 89 fL (ref 79–97)
Monocytes Absolute: 0.6 x10E3/uL (ref 0.1–0.9)
Monocytes: 9 %
Neutrophils Absolute: 3.7 x10E3/uL (ref 1.4–7.0)
Neutrophils: 54 %
Platelets: 224 x10E3/uL (ref 150–450)
RBC: 5.66 x10E6/uL (ref 4.14–5.80)
RDW: 14.2 % (ref 11.6–15.4)
WBC: 6.8 x10E3/uL (ref 3.4–10.8)

## 2024-01-20 LAB — TSH+FREE T4
Free T4: 1.23 ng/dL (ref 0.82–1.77)
TSH: 2.26 u[IU]/mL (ref 0.450–4.500)

## 2024-01-23 ENCOUNTER — Ambulatory Visit: Payer: Self-pay

## 2024-01-23 ENCOUNTER — Telehealth: Payer: Self-pay

## 2024-01-23 ENCOUNTER — Encounter: Payer: Self-pay | Admitting: Family Medicine

## 2024-01-23 NOTE — Telephone Encounter (Signed)
 Copied from CRM [id number]. Topic: Clinical - Lab/Test Results >> Jan 23, 2024  2:33 PM Justin Villa wrote: Reason for CRM: Pt returning Dyane Powell BIRCH, CMA call for lab results which were provided. Pt has questions about low CO2 and what is the next step? Please call pt at  [phone]

## 2024-01-23 NOTE — Telephone Encounter (Signed)
 Spoke to patient

## 2024-01-23 NOTE — Telephone Encounter (Signed)
 Copied from CRM [id number]. Topic: Clinical - Lab/Test Results >> Jan 23, 2024  2:33 PM Zebedee SAUNDERS wrote: Reason for CRM: Pt returning Justin Villa, CMA call for lab results which were provided. Pt has questions about low CO2 and what is the next step? Please call pt at  [phone]

## 2024-01-24 ENCOUNTER — Other Ambulatory Visit: Payer: Self-pay | Admitting: Nurse Practitioner

## 2024-01-24 DIAGNOSIS — R7981 Abnormal blood-gas level: Secondary | ICD-10-CM

## 2024-01-24 NOTE — Telephone Encounter (Signed)
 Attempted to speak to patient, would not let me complete any information, constantly cutting me off, transferred to chelsa

## 2024-02-01 ENCOUNTER — Ambulatory Visit (INDEPENDENT_AMBULATORY_CARE_PROVIDER_SITE_OTHER): Admitting: Internal Medicine

## 2024-02-01 ENCOUNTER — Encounter: Payer: Self-pay | Admitting: Internal Medicine

## 2024-02-01 ENCOUNTER — Ambulatory Visit (HOSPITAL_COMMUNITY)
Admission: RE | Admit: 2024-02-01 | Discharge: 2024-02-01 | Disposition: A | Discharge status: Home / Self Care | Source: Ambulatory Visit | Attending: Internal Medicine | Admitting: Internal Medicine

## 2024-02-01 VITALS — BP 126/76 | HR 60 | Ht 72.0 in | Wt 241.2 lb

## 2024-02-01 DIAGNOSIS — R0609 Other forms of dyspnea: Secondary | ICD-10-CM | POA: Diagnosis present

## 2024-02-01 DIAGNOSIS — I10 Essential (primary) hypertension: Secondary | ICD-10-CM

## 2024-02-01 MED ORDER — OLMESARTAN MEDOXOMIL 40 MG PO TABS: 40.0000 mg | ORAL_TABLET | Freq: Every day | ORAL | 11 refills | Status: AC

## 2024-02-01 NOTE — Assessment & Plan Note (Addendum)
 In the best review of chronic cough to date ( NEJM 2016 375 [id number]) ,  ACEi are now felt to cause cough in up to  20% of pts which is a 4 fold increase from previous reports and does not include the variety of non-specific complaints we see in pulmonary clinic in pts on ACEi but previously attributed to another dx like  Copd/asthma and  include PNDS, throat and chest congestion, bronchitis, unexplained dyspnea and noct strangling sensations, and hoarseness, but also  atypical /refractory GERD symptoms like dysphagia and bad heartburn   The only way I know  to prove this is not an ACEi Case is a trial off ACEi x a minimum of 6 weeks then regroup.   >>> try benicar  40 and perhaps wean lopressor  due to slow HR and h/o dizziness though not directly related to ex   Discussed in detail all the  indications, usual  risks and alternatives  relative to the benefits with patient who agrees to proceed with Rx as outlined.      Each maintenance medication was reviewed in detail including emphasizing most importantly the difference between maintenance and prns and under what circumstances the prns are to be triggered using an action plan format where appropriate.  Total time for H and P, chart review, counseling,  directly observing portions of ambulatory 02 saturation study/ and generating customized AVS unique to this office visit / same day charting = 50 min new pt eval

## 2024-02-01 NOTE — Patient Instructions (Signed)
 Stop lisiopril and replace it with olmesartan  40 mg one daily in its place  Increase pantoprazole  40 mg Take 30- 60 min before your first and last meals of the day   GERD (REFLUX)  is an extremely common cause of respiratory symptoms just like yours , many times with no obvious heartburn at all.    It can be treated with medication, but also with lifestyle changes including elevation of the head of your bed (ideally with 6 -8inch blocks under the headboard of your bed),  Smoking cessation, avoidance of late meals, excessive alcohol, and avoid fatty foods, chocolate, peppermint, colas, red wine, and acidic juices such as orange juice.  NO MINT OR MENTHOL PRODUCTS SO NO COUGH DROPS  USE SUGARLESS CANDY INSTEAD (Jolley ranchers or Stover's or Life Savers) or even ice chips will also do - the key is to swallow to prevent all throat clearing. NO OIL BASED VITAMINS - use powdered substitutes.  Avoid fish oil when coughing.   Please remember to go to the lab department   for your tests - we will call you with the results when they are available.      Please remember to go to the  x-ray department  @  [facility] for your tests - we will call you with the results when they are available      Please schedule a follow up office visit in 6 weeks, call sooner if needed

## 2024-02-01 NOTE — Progress Notes (Signed)
 Justin Villa Rather, male    DOB: [birth year]    MRN: [id number]   Brief patient profile:  20  yowm  never smoker  referred to pulmonary clinic in [place]  02/01/2024 by Justin Villa  for doe x 2023   Seen for sleep eval 11/2022 by NP rec sleep study but not done   11/25/15 PFTs nl @ 233 lbs   03/07/23  echo with G 2  diastolic dysfunction but nl la /RA   History of Present Illness  02/01/2024  Pulmonary/ 1st Villa eval/ Justin Villa / Justin Villa @ 241 lbs  Chief Complaint  Patient presents with   Establish Care    Abn blood work - shob / daytime sleepiness  Blood c02 decrease  Dyspnea:  50 ft  Cough: none  Sleep: bed is flat / 2 pillows awakening to pee x 2  SABA use: none  02: none     No obvious day to day or daytime pattern/variability or assoc excess/ purulent sputum or mucus plugs or hemoptysis or cp or chest tightness, subjective wheeze or overt sinus or hb symptoms.    Also denies any obvious fluctuation of symptoms with weather or environmental changes or other aggravating or alleviating factors except as outlined above   No unusual exposure hx or h/o childhood pna/ asthma or knowledge of premature birth.  Current Allergies, Complete Past Medical History, Past Surgical History, Family History, and Social History were reviewed in Justin Villa record.  ROS  The following are not active complaints unless bolded Hoarseness, sore throat, dysphagia, dental problems, itching, sneezing,  nasal congestion or discharge of excess mucus or purulent secretions, ear ache,   fever, chills, sweats, unintended wt loss or wt gain, classically pleuritic or exertional cp,  orthopnea pnd or arm/hand swelling  or leg swelling, presyncope, palpitations, abdominal pain, anorexia, nausea, vomiting, diarrhea  or change in bowel habits or change in bladder habits, change in stools or change in urine, dysuria, hematuria,  rash, arthralgias, visual complaints, headache, numbness,  weakness or ataxia or problems with walking or coordination,  change in mood or  memory. Dizzy not related to exertion            Outpatient Medications Prior to Visit  Medication Sig Dispense Refill   aspirin  EC 81 MG EC tablet Take 1 tablet (81 mg total) by mouth daily.     atorvastatin  (LIPITOR ) 40 MG tablet Take 1 tablet (40 mg total) by mouth daily. 90 tablet 3   Cholecalciferol (VITAMIN D ) 50 MCG (2000 UT) CAPS Take 2,000 Units by mouth daily.     lisinopril  (ZESTRIL ) 40 MG tablet Take 1 tablet (40 mg total) by mouth daily. 90 tablet 3   metoprolol  succinate (TOPROL -XL) 25 MG 24 hr tablet TAKE 1 TABLET BY MOUTH DAILY 90 tablet 3   nitroGLYCERIN  (NITROSTAT ) 0.4 MG SL tablet Place 1 tablet (0.4 mg total) under the tongue every 5 (five) minutes x 3 doses as needed for chest pain (if no relief after 2nd dose, proceed to ED or call 911). 25 tablet 3   oxyCODONE -acetaminophen  (PERCOCET/ROXICET) 5-325 MG tablet Take 1 tablet by mouth every 4 (four) hours as needed. 30 tablet 0   pantoprazole  (PROTONIX ) 40 MG tablet TAKE 1 TABLET BY MOUTH 2 TIMES A DAY BEFORE A MEAL 60 tablet 5   No facility-administered medications prior to visit.    Past Medical History:  Diagnosis Date   CAD (coronary artery disease) 11/27/2015   a. S/p  NSTEMI 6/17 >> s/p CABG // b. LHC 6/17: oLAD 50, mLAD 100, D1 70, D2 95, oLCx 95, mLCx 65, OM1 85, OM2 90, mRCA 85, dRCA 75, RPDA 40/50, RPLB2 50    Carotid stenosis    a. Carotid US  6/17: bilat ICA 1-39%   Essential hypertension    GERD (gastroesophageal reflux disease)    History of non-ST elevation myocardial infarction (NSTEMI) 11/2015   Hyperlipidemia    Stroke (HCC)       Objective:     BP 126/76   Pulse 60   Ht 6' (1.829 m)   Wt 241 lb 3.2 oz (109.4 kg)   SpO2 98% Comment: ra  BMI 32.71 kg/m   SpO2: 98 % (ra)   Amb wm poor- recall for meds   HEENT : Oropharynx  nl      Nasal turbinates nl   NECK :  without  apparent JVD/ palpable Nodes/TM /  squeaky inspiration    LUNGS: no acc muscle use,  Nl contour chest which is clear to A and P bilaterally without cough on insp or exp maneuvers   CV:  RRR  no s3 or murmur or increase in P2, and no edema   ABD:  soft and nontender   MS:  Gait nl   ext warm without deformities Or obvious joint restrictions  calf tenderness, cyanosis or clubbing    SKIN: warm and dry without lesions    NEURO:  alert, approp, nl sensorium with  no motor or cerebellar deficits apparent.      CXR PA and Lateral:   02/01/2024 :    I personally reviewed images and impression is as follows:     Mild reduction in lung vol, min kyphosis   Labs ordered/ reviewed:      Chemistry      Component Value Date/Time   NA 137 01/19/2024 0906   K 4.5 01/19/2024 0906   CL 102 01/19/2024 0906   CO2 19 (L) 01/19/2024 0906   BUN 17 01/19/2024 0906   CREATININE 1.11 01/19/2024 0906   CREATININE 1.09 12/17/2015 1158      Component Value Date/Time   CALCIUM  9.3 01/19/2024 0906   ALKPHOS 63 01/19/2024 0906   AST 31 01/19/2024 0906   ALT 46 (H) 01/19/2024 0906   BILITOT 1.5 (H) 01/19/2024 0906        Lab Results  Component Value Date   WBC 6.8 01/19/2024   HGB 16.8 01/19/2024   HCT 50.3 01/19/2024   MCV 89 01/19/2024   PLT 224 01/19/2024     D dimer 02/01/2024  pending   Lab Results  Component Value Date   TSH 2.260 01/19/2024     BNP 02/01/2024 pending      Assessment   Assessment & Plan DOE (dyspnea on exertion) Onset around 2023  -11/25/15 PFTs nl @ 233 lbs  preop c/o doe then cleared up p cabg  -03/07/23  echo with G 2  diastolic dysfunction but nl la /RA - progressed to doe x walking to car (92ft) 02/01/2024  - 02/01/2024   Walked on RA  x  3  lap(s) =  approx 450   ft  @ mod  pace, stopped due to end of study  with lowest 02 sats 95% with mild sob   Symptoms are markedly disproportionate to objective findings and not clear to what extent this is actually a pulmonary  problem but pt does  appear to have difficult to sort out  respiratory symptoms of unknown origin for which  DDX  = almost all start with A and  include Adherence, Ace Inhibitors, Acid Reflux, Active Sinus Disease, Alpha 1 Antitripsin deficiency, Anxiety masquerading as Airways dz,  ABPA,  Allergy(esp in young), Aspiration (esp in elderly), Adverse effects of meds,  Active smoking or Vaping, A bunch of PE's/clot burden (a few small clots can't cause this syndrome unless there is already severe underlying pulm or vascular dz with poor reserve),  Anemia or thyroid  disorder, plus two Bs  = Bronchiectasis and Beta blocker use..and one C= CHF    Most likely dx in bold:  ACEi adverse effects at the  top of the usual list of suspects and the only way to rule it out is a trial off > see a/p    ? Acid (or non-acid) GERD > always difficult to exclude as up to 75% of pts in some series report no assoc GI/ Heartburn symptoms> rec max (24h)  acid suppression and diet restrictions/ reviewed and instructions given in writing.   ? A bunch of PE's    while a normal  or high normal value (seen commonly in the elderly or chronically ill)  may miss small peripheral pe, the clot burden with sob is moderately high and the d dimer  has a very high neg pred value if used in this setting.    ? CHF > BNP pending>>> note grade 2 diastolic dysfunction on echo 03/2023  but with nl LA and now with chronotropic incompetence on lopressor  may need down or off   Essential hypertension In the best review of chronic cough to date ( NEJM 2016 375 [id number]) ,  ACEi are now felt to cause cough in up to  20% of pts which is a 4 fold increase from previous reports and does not include the variety of non-specific complaints we see in pulmonary clinic in pts on ACEi but previously attributed to another dx like  Copd/asthma and  include PNDS, throat and chest congestion, bronchitis, unexplained dyspnea and noct strangling sensations, and hoarseness, but also   atypical /refractory GERD symptoms like dysphagia and bad heartburn   The only way I know  to prove this is not an ACEi Case is a trial off ACEi x a minimum of 6 weeks then regroup.   >>> try benicar  40 and perhaps wean lopressor  due to slow HR and h/o dizziness though not directly related to ex   Discussed in detail all the  indications, usual  risks and alternatives  relative to the benefits with patient who agrees to proceed with Rx as outlined.      Each maintenance medication was reviewed in detail including emphasizing most importantly the difference between maintenance and prns and under what circumstances the prns are to be triggered using an action plan format where appropriate.  Total time for H and P, chart review, counseling,  directly observing portions of ambulatory 02 saturation study/ and generating customized AVS unique to this Villa visit / same day charting = 50 min new pt eval                    AVS  Patient Instructions  Stop lisiopril and replace it with olmesartan  40 mg one daily in its place  Increase pantoprazole  40 mg Take 30- 60 min before your first and last meals of the day   GERD (REFLUX)  is an extremely common cause of respiratory symptoms just like yours ,  many times with no obvious heartburn at all.    It can be treated with medication, but also with lifestyle changes including elevation of the head of your bed (ideally with 6 -8inch blocks under the headboard of your bed),  Smoking cessation, avoidance of late meals, excessive alcohol, and avoid fatty foods, chocolate, peppermint, colas, red wine, and acidic juices such as orange juice.  NO MINT OR MENTHOL PRODUCTS SO NO COUGH DROPS  USE SUGARLESS CANDY INSTEAD (Jolley ranchers or Stover's or Life Savers) or even ice chips will also do - the key is to swallow to prevent all throat clearing. NO OIL BASED VITAMINS - use powdered substitutes.  Avoid fish oil when coughing.   Please remember to go  to the lab department   for your tests - we will call you with the results when they are available.      Please remember to go to the  x-ray department  @  [facility] for your tests - we will call you with the results when they are available      Please schedule a follow up Villa visit in 6 weeks, call sooner if needed    Ozell America, MD 02/01/2024

## 2024-02-01 NOTE — Assessment & Plan Note (Addendum)
 Onset around 2023  -11/25/15 PFTs nl @ 233 lbs  preop c/o doe then cleared up p cabg  -03/07/23  echo with G 2  diastolic dysfunction but nl la /RA - progressed to doe x walking to car (62ft) 02/01/2024  - 02/01/2024   Walked on RA  x  3  lap(s) =  approx 450   ft  @ mod  pace, stopped due to end of study  with lowest 02 sats 95% with mild sob   Symptoms are markedly disproportionate to objective findings and not clear to what extent this is actually a pulmonary  problem but pt does appear to have difficult to sort out respiratory symptoms of unknown origin for which  DDX  = almost all start with A and  include Adherence, Ace Inhibitors, Acid Reflux, Active Sinus Disease, Alpha 1 Antitripsin deficiency, Anxiety masquerading as Airways dz,  ABPA,  Allergy(esp in young), Aspiration (esp in elderly), Adverse effects of meds,  Active smoking or Vaping, A bunch of PE's/clot burden (a few small clots can't cause this syndrome unless there is already severe underlying pulm or vascular dz with poor reserve),  Anemia or thyroid  disorder, plus two Bs  = Bronchiectasis and Beta blocker use..and one C= CHF    Most likely dx in bold:  ACEi adverse effects at the  top of the usual list of suspects and the only way to rule it out is a trial off > see a/p    ? Acid (or non-acid) GERD > always difficult to exclude as up to 75% of pts in some series report no assoc GI/ Heartburn symptoms> rec max (24h)  acid suppression and diet restrictions/ reviewed and instructions given in writing.   ? A bunch of PE's    while a normal  or high normal value (seen commonly in the elderly or chronically ill)  may miss small peripheral pe, the clot burden with sob is moderately high and the d dimer  has a very high neg pred value if used in this setting.    ? CHF > BNP pending>>> note grade 2 diastolic dysfunction on echo 03/2023  but with nl LA and now with chronotropic incompetence on lopressor  may need down or off

## 2024-02-02 LAB — BRAIN NATRIURETIC PEPTIDE: BNP: 13.4 pg/mL (ref 0.0–100.0)

## 2024-02-02 LAB — D-DIMER, QUANTITATIVE: D-DIMER: <0.2 mg{FEU}/L (ref 0.00–0.49)

## 2024-02-03 ENCOUNTER — Ambulatory Visit: Payer: Self-pay | Admitting: Internal Medicine

## 2024-02-06 NOTE — Progress Notes (Signed)
 Spoke with pt and relayed results, pt confirmed understanding. Nfn

## 2024-02-12 NOTE — Progress Notes (Signed)
 Called and spoke with pt to relay results. Pt confirmed understanding. nfn

## 2024-02-19 ENCOUNTER — Ambulatory Visit (INDEPENDENT_AMBULATORY_CARE_PROVIDER_SITE_OTHER): Admitting: Orthopedic Surgery

## 2024-02-19 ENCOUNTER — Encounter: Payer: Self-pay | Admitting: Orthopedic Surgery

## 2024-02-19 DIAGNOSIS — M75101 Unspecified rotator cuff tear or rupture of right shoulder, not specified as traumatic: Secondary | ICD-10-CM | POA: Diagnosis not present

## 2024-02-19 DIAGNOSIS — M75111 Incomplete rotator cuff tear or rupture of right shoulder, not specified as traumatic: Secondary | ICD-10-CM

## 2024-02-19 DIAGNOSIS — M25511 Pain in right shoulder: Secondary | ICD-10-CM | POA: Diagnosis not present

## 2024-02-19 DIAGNOSIS — G8929 Other chronic pain: Secondary | ICD-10-CM | POA: Diagnosis not present

## 2024-02-19 NOTE — Progress Notes (Signed)
 Office Visit Note   Patient: Justin Villa           Date of Birth: 06-04-1966           MRN: [id number] Visit Date: 02/19/2024              Requested by: Glennon Sand, NP [address], [address] [place],  [place] [address] PCP: Glennon Sand, NP  Chief Complaint  Patient presents with   Right Shoulder - Follow-up    MRI review       HPI: Discussed the use of AI scribe software for clinical note transcription with the patient, who gave verbal consent to proceed.  History of Present Illness Justin Villa is a 58 year old male with a complete rotator cuff tear and glenohumeral arthritis who presents with shoulder pain and dysfunction.  He experiences significant shoulder pain and dysfunction, describing it as feeling like 'a bomb went off' in his shoulder. The pain is exacerbated by certain movements, such as walking and stomping his foot, which causes intense pain. He also experiences significant discomfort at night, requiring him to sleep with his head under the pillow to alleviate the pain.  He has a history of receiving a subacromial injection, which did not provide relief. He has not found significant improvement with other conservative measures.  His foot has improved, allowing him to walk better, as evidenced by a recent trip to [facility] where he was able to walk extensively, although the bottom of his foot became sore.     Assessment & Plan: Visit Diagnoses:  1. Nontraumatic incomplete tear of right rotator cuff   2. Chronic right shoulder pain   3. Rotator cuff syndrome of right shoulder     Plan: Assessment and Plan Assessment & Plan Right shoulder complete rotator cuff tear with retraction and glenohumeral arthritis MRI shows complete rotator cuff retraction, glenohumeral arthritis, and effusion. Severe condition with humeral head migration and rotator cuff erosion. Significant nocturnal pain and crepitation noted. Previous injections  ineffective. - Refer to Dr. Addie for surgical evaluation, consider arthroscopic intervention or reverse total shoulder arthroplasty.      Follow-Up Instructions: No follow-ups on file.   Ortho Exam  Patient is alert, oriented, no adenopathy, well-dressed, normal affect, normal respiratory effort. Physical Exam MUSCULOSKELETAL: Pain with Near, Hawkins impingement test. Crepitation with range of motion, abduction to 70 degrees.  Pain with passive range of motion.  Review of the MRI scan shows glenohumeral arthritis with extensive destruction of the rotator cuff as well as effusion of the joint      Imaging: No results found. No images are attached to the encounter.  Labs: Lab Results  Component Value Date   HGBA1C 5.7 (H) 06/17/2022   HGBA1C 5.1 11/27/2015   HGBA1C 5.6 11/26/2015   ESRSEDRATE 2 05/08/2023   ESRSEDRATE 14 06/12/2020   CRP <3.0 05/08/2023   CRP 1.7 (H) 06/12/2020   LABURIC 5.7 08/07/2023   REPTSTATUS 06/21/2020 FINAL 06/16/2020   REPTSTATUS 06/21/2020 FINAL 06/16/2020   GRAMSTAIN  06/16/2020    FEW WBC PRESENT,BOTH PMN AND MONONUCLEAR NO ORGANISMS SEEN    GRAMSTAIN  06/16/2020    RARE WBC PRESENT, PREDOMINANTLY PMN NO ORGANISMS SEEN    CULT  06/16/2020    RARE PSEUDOMONAS AERUGINOSA NO ANAEROBES ISOLATED Performed at [facility], [address], [place], [place] [address]    CULT  06/16/2020    RARE PSEUDOMONAS AERUGINOSA NO ANAEROBES ISOLATED Performed at [facility], [address] [place], [place] [address]    LABORGA PSEUDOMONAS AERUGINOSA 06/16/2020   LABORGA PSEUDOMONAS AERUGINOSA 06/16/2020     Lab Results  Component Value Date   ALBUMIN  4.5 01/19/2024   ALBUMIN  3.8 08/23/2023   ALBUMIN  4.1 01/29/2023    Lab Results  Component Value Date   MG 2.5 (H) 11/28/2015   MG 2.4 11/28/2015   MG 2.6 (H) 11/27/2015   Lab Results  Component Value Date   VD25OH 26.8 (L) 06/17/2022    No results found for:  PREALBUMIN    Latest Ref Rng & Units 01/19/2024    9:06 AM 08/23/2023    8:20 AM 05/08/2023    3:12 PM  CBC EXTENDED  WBC 3.4 - 10.8 x10E3/uL 6.8  6.9  7.9   RBC 4.14 - 5.80 x10E6/uL 5.66  6.48  5.91   Hemoglobin 13.0 - 17.7 g/dL 83.1  82.2  83.0   HCT 37.5 - 51.0 % 50.3  53.1  51.1   Platelets 150 - 450 x10E3/uL 224  255  306   NEUT# 1.4 - 7.0 x10E3/uL 3.7   4,329   Lymph# 0.7 - 3.1 x10E3/uL 2.1        There is no height or weight on file to calculate BMI.  Orders:  No orders of the defined types were placed in this encounter.  No orders of the defined types were placed in this encounter.    Procedures: No procedures performed  Clinical Data: No additional findings.  ROS:  All other systems negative, except as noted in the HPI. Review of Systems  Objective: Vital Signs: There were no vitals taken for this visit.  Specialty Comments:  No specialty comments available.  PMFS History: Patient Active Problem List   Diagnosis Date Noted   DOE (dyspnea on exertion) 02/01/2024   Vertigo 01/19/2024   Other fatigue 01/19/2024   Acute osteomyelitis of metatarsal bone of right foot (HCC) 08/23/2023   History of colonic polyps 07/06/2023   Hypocalcemia 04/10/2023   Left carpal tunnel syndrome 10/19/2022   Loud snoring 10/12/2022   Facial skin lesion 10/07/2022   Prediabetes 10/07/2022   Vitamin D  insufficiency 10/07/2022   Barrett's esophagus 10/07/2022   GERD (gastroesophageal reflux disease) 07/05/2022   Numbness and tingling in left hand 06/19/2022   History of diverticulitis 06/19/2022   Encounter for general adult medical examination with abnormal findings 06/19/2022   Risk factors for obstructive sleep apnea 06/19/2022   S/P foot surgery, right I and D 06/16/20  06/18/2020   Septic arthritis of right foot (HCC)    Pain in right foot 06/15/2020   Essential hypertension 12/10/2015   HLD (hyperlipidemia) 12/10/2015   Carotid stenosis    CAD (coronary artery  disease) 11/27/2015   NSTEMI (non-ST elevated myocardial infarction) (HCC) 11/26/2015   Arthropathy, forearm 07/19/2010   Past Medical History:  Diagnosis Date   CAD (coronary artery disease) 11/27/2015   a. S/p NSTEMI 6/17 >> s/p CABG // b. LHC 6/17: oLAD 50, mLAD 100, D1 70, D2 95, oLCx 95, mLCx 65, OM1 85, OM2 90, mRCA 85, dRCA 75, RPDA 40/50, RPLB2 50    Carotid stenosis    a. Carotid US  6/17: bilat ICA 1-39%   Essential hypertension    GERD (gastroesophageal reflux disease)    History of non-ST elevation myocardial infarction (NSTEMI) 11/2015   Hyperlipidemia    Stroke [facility])     Family History  Problem Relation Age of Onset  Heart disease Father    Colon cancer Neg Hx     Past Surgical History:  Procedure Laterality Date   AMPUTATION Right 08/23/2023   Procedure: AMPUTATION, FOOT, RAY;  Surgeon: Harden Jerona GAILS, MD;  Location: [facility] OR;  Service: Orthopedics;  Laterality: Right;  RIGHT 5TH RAY AMPUTATION   BACK SURGERY     BIOPSY  08/25/2022   Procedure: BIOPSY;  Surgeon: Cindie Carlin POUR, DO;  Location: [facility] END[facility] SUITE;  Service: Endoscopy;;   CARDIAC CATHETERIZATION N/A 11/26/2015   Procedure: Left Heart Cath and Coronary Angiography;  Surgeon: Victory LELON Sharps, MD;  Location: [facility] INVASIVE CV LAB;  Service: Cardiovascular;  Laterality: N/A;   CARPAL TUNNEL RELEASE Left 10/19/2022   Procedure: CARPAL TUNNEL RELEASE;  Surgeon: Margrette Taft BRAVO, MD;  Location: [facility] ORS;  Service: Orthopedics;  Laterality: Left;   COLONOSCOPY WITH PROPOFOL  N/A 08/25/2022   Procedure: COLONOSCOPY WITH PROPOFOL ;  Surgeon: Cindie Carlin POUR, DO;  Location: [facility];  Service: Endoscopy;  Laterality: N/A;  10:30am, asa 3   CORONARY ARTERY BYPASS GRAFT N/A 11/27/2015   Procedure: CORONARY ARTERY BYPASS GRAFTING (CABG) x 5 (LIMA to LAD, SVG to DIAGONAL, SVG SEQUENTIALLY to OM1 and OM2, SVG to PDA) with EVH from right leg using greater saphenous vein and mammary.;  Surgeon: Dallas KATHEE Jude, MD;  Location:  [facility] OR;  Service: Open Heart Surgery;  Laterality: N/A;   ESOPHAGOGASTRODUODENOSCOPY (EGD) WITH PROPOFOL  N/A 08/25/2022   Procedure: ESOPHAGOGASTRODUODENOSCOPY (EGD) WITH PROPOFOL ;  Surgeon: Cindie Carlin POUR, DO;  [facility]: [facility];  Service: Endoscopy;  Laterality: N/A;   INCISION AND DRAINAGE ABSCESS Right 06/16/2020   Procedure: INCISION AND DRAINAGE ABSCESS RIGHT PINKY TOE (5th toe);  Surgeon: Margrette Taft BRAVO, MD;  Location: [facility] ORS;  Service: Orthopedics;  Laterality: Right;   INTRAOPERATIVE TRANSESOPHAGEAL ECHOCARDIOGRAM N/A 11/27/2015   Procedure: INTRAOPERATIVE TRANSESOPHAGEAL ECHOCARDIOGRAM;  Surgeon: Dallas KATHEE Jude, MD;  Location: [facility] OR;  Service: Open Heart Surgery;  Laterality: N/A;   KNEE SURGERY Left    arthroscopy   POLYPECTOMY  08/25/2022   Procedure: POLYPECTOMY;  Surgeon: Cindie Carlin POUR, DO;  [facility];  Service: Endoscopy;;   Social History   Occupational History   Not on file  Tobacco Use   Smoking status: Never   Smokeless tobacco: Never  Vaping Use   Vaping status: Never Used  Substance and Sexual Activity   Alcohol use: Yes    Alcohol/week: 2.0 standard drinks of alcohol    Types: 2 Shots of liquor per week    Comment: everyother day   Drug use: No   Sexual activity: Yes

## 2024-03-05 ENCOUNTER — Ambulatory Visit: Attending: Cardiology | Admitting: Cardiology

## 2024-03-05 ENCOUNTER — Encounter: Payer: Self-pay | Admitting: Cardiology

## 2024-03-05 VITALS — BP 138/80 | HR 59 | Ht 72.0 in | Wt 243.0 lb

## 2024-03-05 DIAGNOSIS — I25119 Atherosclerotic heart disease of native coronary artery with unspecified angina pectoris: Secondary | ICD-10-CM

## 2024-03-05 DIAGNOSIS — E782 Mixed hyperlipidemia: Secondary | ICD-10-CM | POA: Diagnosis not present

## 2024-03-05 DIAGNOSIS — I10 Essential (primary) hypertension: Secondary | ICD-10-CM | POA: Diagnosis not present

## 2024-03-05 NOTE — Patient Instructions (Signed)
 Medication Instructions:   STOP Lipitor    Labwork: Fasting Lipids  Testing/Procedures: None today  Follow-Up: 6 months  Any Other Special Instructions Will Be Listed Below (If Applicable).  If you need a refill on your cardiac medications before your next appointment, please call your pharmacy.

## 2024-03-05 NOTE — Progress Notes (Signed)
    Cardiology Office Note  Date: 03/05/2024   ID: Justin Villa, DOB [birth year], MRN [id number]  History of Present Illness: Justin Villa is a 58 y.o. male last seen in February.  He is here for a follow-up visit.  Reports no angina or interval nitroglycerin  use. He did have osteomyelitis involving the right fifth metatarsal and underwent amputation of the right foot fifth ray by Dr. Harden back in March.  We reviewed his medications.  He states that since increasing Lipitor  to 40 mg daily he has had more arthralgias and cut back to the 20 mg dose.  His LDL was 90 last year.  We discussed other options for treatment and a goal LDL at least under 70, ideally in the 50s to 60s.  He reports compliance with his medications otherwise.  He was switched from lisinopril  to olmesartan  following visit with Dr. Darlean.  Physical Exam: VS:  BP 138/80 (BP Location: Left Arm)   Pulse (!) 59   Ht 6' (1.829 m)   Wt 243 lb (110.2 kg)   SpO2 98%   BMI 32.96 kg/m , BMI Body mass index is 32.96 kg/m.  Wt Readings from Last 3 Encounters:  03/05/24 243 lb (110.2 kg)  02/01/24 241 lb 3.2 oz (109.4 kg)  01/19/24 241 lb (109.3 kg)    General: Patient appears comfortable at rest. HEENT: Conjunctiva and lids normal. Neck: Supple, no elevated JVP or carotid bruits. Lungs: Clear to auscultation, nonlabored breathing at rest. Cardiac: Regular rate and rhythm, no S3 or significant systolic murmur. Extremities: No pitting edema.  ECG:  An ECG dated 01/29/2023 was personally reviewed today and demonstrated:  Sinus rhythm with left anterior fascicular block.  Labwork: 01/19/2024: ALT 46; AST 31; BUN 17; Creatinine, Ser 1.11; Hemoglobin 16.8; Platelets 224; Potassium 4.5; Sodium 137; TSH 2.260 02/01/2024: BNP 13.4     Component Value Date/Time   CHOL 156 06/17/2022 1011   TRIG 190 (H) 06/17/2022 1011   HDL 33 (L) 06/17/2022 1011   CHOLHDL 4.7 06/17/2022 1011   CHOLHDL 5.4 10/22/2019 0915   VLDL 32  10/22/2019 0915   LDLCALC 90 06/17/2022 1011   Other Studies Reviewed Today:  No interval cardiac testing for review today.  Assessment and Plan:  1.  Multivessel CAD status post CABG in 2017 with LIMA to LAD, SVG to diagonal, SVG to OM1 and OM 2, and SVG to PDA.  LVEF 60 to 65% by echocardiogram in October 2024.  Lexiscan  Myoview  at that time was also low risk with no definite ischemia.  He does not report any angina or interval nitroglycerin  use.  Continue aspirin  81 mg daily and as needed nitroglycerin .   2.  Primary hypertension.  Continue Toprol -XL 25 mg daily and Benicar  40 mg daily.   3.  Mixed hyperlipidemia.  LDL 90 in January 2024.  Reporting arthralgias on Lipitor  40 mg daily and cut back to 20 mg daily on his own.  We will stop Lipitor  completely, check follow-up FLP, and make recommendations from there.  Disposition:  Follow up 6 months.  Signed, Jayson JUDITHANN Sierras, M.D., F.A.C.C. [facility]

## 2024-03-06 ENCOUNTER — Other Ambulatory Visit: Payer: Self-pay

## 2024-03-06 ENCOUNTER — Ambulatory Visit: Admitting: Orthopedic Surgery

## 2024-03-06 DIAGNOSIS — M25511 Pain in right shoulder: Secondary | ICD-10-CM | POA: Diagnosis not present

## 2024-03-06 DIAGNOSIS — M75111 Incomplete rotator cuff tear or rupture of right shoulder, not specified as traumatic: Secondary | ICD-10-CM

## 2024-03-07 DIAGNOSIS — M75111 Incomplete rotator cuff tear or rupture of right shoulder, not specified as traumatic: Secondary | ICD-10-CM

## 2024-03-07 NOTE — Progress Notes (Signed)
 Office Visit Note   Patient: Justin Villa           Date of Birth: Aug 31, 1965           MRN: [id number] Visit Date: 03/06/2024 Requested by: Glennon Sand, NP No address on file PCP: Glennon Sand, NP (Inactive)  Subjective: Chief Complaint  Patient presents with   Right Shoulder - Pain    HPI: Justin Villa is a 58 y.o. male who presents to the office reporting right shoulder pain.  Patient had MRI scan done 01/18/2024 which showed full-thickness retracted tears of the posterior superior rotator cuff with partial tearing of the biceps tendon.  Hard for him to lift it.  Symptoms going on for a year.  Did have a fall 2 years ago in the elbow which messed up the shoulder.  He was doing a lot of working out at that time.  He was able to rehabilitate the shoulder after that.  Patient states that his shoulder really aches all the time.  He is self-employed dealing with cars and flipping houses.  Has some occasional catching and popping.  Did have 1 subacromial injection over 2 months ago which did not help.  Does not take any medication for the problem.  Wants to avoid surgery if possible..                ROS: All systems reviewed are negative as they relate to the chief complaint within the history of present illness.  Patient denies fevers or chills.  Assessment & Plan: Visit Diagnoses:  1. Right shoulder pain, unspecified chronicity     Plan: Impression is rotator cuff arthropathy with retracted nonrepairable rotator cuff tears in the right shoulder.  Plan at this time is ultrasound-guided injection into the right shoulder.  Will see him back in 4 months for clinical recheck.  Overall he has got full forward flexion but just a little bit of weakness with overhead motion.  He may be able to survive a little while longer without intervention.  Follow-up in 4 months.  Follow-Up Instructions: No follow-ups on file.   Orders:  Orders Placed This Encounter  Procedures   US  Guided  Needle Placement - No Linked Charges   No orders of the defined types were placed in this encounter.     Procedures: Large Joint Inj: R glenohumeral on 03/07/2024 12:10 PM Indications: diagnostic evaluation and pain Details: 22 G 3.5 in needle, ultrasound-guided posterior approach  Arthrogram: No  Medications: 9 mL bupivacaine  0.5 %; 5 mL lidocaine  1 %; 40 mg triamcinolone acetonide 40 MG/ML Outcome: tolerated well, no immediate complications Procedure, treatment alternatives, risks and benefits explained, specific risks discussed. Consent was given by the patient. Immediately prior to procedure a time out was called to verify the correct patient, procedure, equipment, support staff and site/side marked as required. Patient was prepped and draped in the usual sterile fashion.       Clinical Data: No additional findings.  Objective: Vital Signs: There were no vitals taken for this visit.  Physical Exam:  Constitutional: Patient appears well-developed HEENT:  Head: Normocephalic Eyes:EOM are normal Neck: Normal range of motion Cardiovascular: Normal rate Pulmonary/chest: Effort normal Neurologic: Patient is alert Skin: Skin is warm Psychiatric: Patient has normal mood and affect  Ortho Exam: Ortho exam demonstrates full forward flexion as well as abduction to 90 degrees on that right-hand side.  Has some weakness with external rotation on the right compared to the left but subscap  strength is 5+ out of 5 bilaterally.  Does have a Popeye deformity in the right arm consistent with biceps tendon rupture.  Has some crepitus and popping with passive range of motion of the right more so than the left but no discrete AC joint tenderness.  Deltoid fires.  Specialty Comments:  No specialty comments available.  Imaging: No results found.   PMFS History: Patient Active Problem List   Diagnosis Date Noted   DOE (dyspnea on exertion) 02/01/2024   Vertigo 01/19/2024   Other  fatigue 01/19/2024   Acute osteomyelitis of metatarsal bone of right foot (HCC) 08/23/2023   History of colonic polyps 07/06/2023   Hypocalcemia 04/10/2023   Left carpal tunnel syndrome 10/19/2022   Loud snoring 10/12/2022   Facial skin lesion 10/07/2022   Prediabetes 10/07/2022   Vitamin D  insufficiency 10/07/2022   Barrett's esophagus 10/07/2022   GERD (gastroesophageal reflux disease) 07/05/2022   Numbness and tingling in left hand 06/19/2022   History of diverticulitis 06/19/2022   Encounter for general adult medical examination with abnormal findings 06/19/2022   Risk factors for obstructive sleep apnea 06/19/2022   S/P foot surgery, right I and D 06/16/20  06/18/2020   Septic arthritis of right foot (HCC)    Pain in right foot 06/15/2020   Essential hypertension 12/10/2015   HLD (hyperlipidemia) 12/10/2015   Carotid stenosis    CAD (coronary artery disease) 11/27/2015   NSTEMI (non-ST elevated myocardial infarction) (HCC) 11/26/2015   Arthropathy, forearm 07/19/2010   Past Medical History:  Diagnosis Date   CAD (coronary artery disease) 11/27/2015   a. S/p NSTEMI 6/17 >> s/p CABG // b. LHC 6/17: oLAD 50, mLAD 100, D1 70, D2 95, oLCx 95, mLCx 65, OM1 85, OM2 90, mRCA 85, dRCA 75, RPDA 40/50, RPLB2 50    Carotid stenosis    a. Carotid US  6/17: bilat ICA 1-39%   Essential hypertension    GERD (gastroesophageal reflux disease)    History of non-ST elevation myocardial infarction (NSTEMI) 11/2015   Hyperlipidemia    Stroke [facility])     Family History  Problem Relation Age of Onset   Heart disease Father    Colon cancer Neg Hx     Past Surgical History:  Procedure Laterality Date   AMPUTATION Right 08/23/2023   Procedure: AMPUTATION, FOOT, RAY;  Surgeon: Harden Jerona GAILS, MD;  Location: [facility] OR;  Service: Orthopedics;  Laterality: Right;  RIGHT 5TH RAY AMPUTATION   BACK SURGERY     BIOPSY  08/25/2022   Procedure: BIOPSY;  Surgeon: Cindie Carlin POUR, DO;  Location: [facility];   Service: Endoscopy;;   CARDIAC CATHETERIZATION N/A 11/26/2015   Procedure: Left Heart Cath and Coronary Angiography;  Surgeon: Victory LELON Sharps, MD;  Location: [facility] INVASIVE CV LAB;  Service: Cardiovascular;  Laterality: N/A;   CARPAL TUNNEL RELEASE Left 10/19/2022   Procedure: CARPAL TUNNEL RELEASE;  Surgeon: Margrette Taft BRAVO, MD;  Location: [facility] ORS;  Service: Orthopedics;  Laterality: Left;   COLONOSCOPY WITH PROPOFOL  N/A 08/25/2022   Procedure: COLONOSCOPY WITH PROPOFOL ;  Surgeon: Cindie Carlin POUR, DO;  Location: [facility];  Service: Endoscopy;  Laterality: N/A;  10:30am, asa 3   CORONARY ARTERY BYPASS GRAFT N/A 11/27/2015   Procedure: CORONARY ARTERY BYPASS GRAFTING (CABG) x 5 (LIMA to LAD, SVG to DIAGONAL, SVG SEQUENTIALLY to OM1 and OM2, SVG to PDA) with EVH from right leg using greater saphenous vein and mammary.;  Surgeon: Dallas KATHEE Jude, MD;  Location: [facility] OR;  Service: Open Heart Surgery;  Laterality: N/A;   ESOPHAGOGASTRODUODENOSCOPY (EGD) WITH PROPOFOL  N/A 08/25/2022   Procedure: ESOPHAGOGASTRODUODENOSCOPY (EGD) WITH PROPOFOL ;  Surgeon: Cindie Carlin POUR, DO;  Location: [facility];  Service: Endoscopy;  Laterality: N/A;   INCISION AND DRAINAGE ABSCESS Right 06/16/2020   Procedure: INCISION AND DRAINAGE ABSCESS RIGHT PINKY TOE (5th toe);  Surgeon: Margrette Taft BRAVO, MD;  Location: [facility] ORS;  Service: Orthopedics;  Laterality: Right;   INTRAOPERATIVE TRANSESOPHAGEAL ECHOCARDIOGRAM N/A 11/27/2015   Procedure: INTRAOPERATIVE TRANSESOPHAGEAL ECHOCARDIOGRAM;  Surgeon: Dallas KATHEE Jude, MD;  Location: [facility] OR;  Service: Open Heart Surgery;  Laterality: N/A;   KNEE SURGERY Left    arthroscopy   POLYPECTOMY  08/25/2022   Procedure: POLYPECTOMY;  Surgeon: Cindie Carlin POUR, DO;  [facility];  Service: Endoscopy;;   Social History   Occupational History   Not on file  Tobacco Use   Smoking status: Never   Smokeless tobacco: Never  Vaping Use   Vaping status: Never Used   Substance and Sexual Activity   Alcohol use: Yes    Alcohol/week: 2.0 standard drinks of alcohol    Types: 2 Shots of liquor per week    Comment: everyother day   Drug use: No   Sexual activity: Yes

## 2024-03-10 ENCOUNTER — Encounter: Payer: Self-pay | Admitting: Orthopedic Surgery

## 2024-03-10 MED ORDER — LIDOCAINE HCL 1 % IJ SOLN
5.0000 mL | INTRAMUSCULAR | Status: AC | PRN
  Administered 2024-03-07: 5 mL

## 2024-03-10 MED ORDER — BUPIVACAINE HCL 0.5 % IJ SOLN
9.0000 mL | INTRAMUSCULAR | Status: AC | PRN
  Administered 2024-03-07: 9 mL via INTRA_ARTICULAR

## 2024-03-10 MED ORDER — TRIAMCINOLONE ACETONIDE 40 MG/ML IJ SUSP
40.0000 mg | INTRAMUSCULAR | Status: AC | PRN
  Administered 2024-03-07: 40 mg via INTRA_ARTICULAR

## 2024-03-14 ENCOUNTER — Ambulatory Visit (INDEPENDENT_AMBULATORY_CARE_PROVIDER_SITE_OTHER): Admitting: Internal Medicine

## 2024-03-14 ENCOUNTER — Encounter: Payer: Self-pay | Admitting: Internal Medicine

## 2024-03-14 VITALS — BP 111/72 | HR 72 | Ht 72.0 in | Wt 243.0 lb

## 2024-03-14 DIAGNOSIS — R0609 Other forms of dyspnea: Secondary | ICD-10-CM | POA: Diagnosis not present

## 2024-03-14 DIAGNOSIS — I10 Essential (primary) hypertension: Secondary | ICD-10-CM

## 2024-03-14 NOTE — Assessment & Plan Note (Addendum)
 Onset around 2023  -11/25/15 PFTs nl @ 233 lbs  preop c/o doe then cleared up p cabg  -03/07/23  echo with G 2  diastolic dysfunction but nl la /RA - progressed to doe x walking to car (54ft) 02/01/2024  - 02/01/2024   Walked on RA  x  3  lap(s) =  approx 450   ft  @ mod  pace, stopped due to end of study  with lowest 02 sats 95% with mild sob   No findings to explain doe and cleared by cards so rec sub max ex x 30 min daily and consider CPST if needed

## 2024-03-14 NOTE — Progress Notes (Signed)
 Justin Villa, male    DOB: 09-Jul-1965    MRN: [id number]   Brief patient profile:  61  yowm  never smoker  referred to pulmonary clinic in [place]  02/01/2024 by Mitzie Carpen  for doe x 2023 when stopped working out regularly due to severe toe infection   Seen for sleep eval 11/2022 by NP rec sleep study but not done   11/25/15 PFTs nl @ 233 lbs   03/07/23  echo with G 2  diastolic dysfunction but nl la /RA   History of Present Illness  02/01/2024  Pulmonary/ 1st office eval/ Deniel Mcquiston / [place] [facility] @ 241 lbs  Chief Complaint  Patient presents with   Establish Care    Abn blood work - shob / daytime sleepiness  Blood c02 decrease  Dyspnea:  50 ft  Cough: none  Sleep: bed is flat / 2 pillows awakening to pee x 2  SABA use: none  02: none  Rec Stop lisiopril and replace it with olmesartan  40 mg one daily in its place Increase pantoprazole  40 mg Take 30- 60 min before your first and last meals of the day  GERD  rx Cxr ok  Doe labs ok     03/14/2024  f/u ov/[place] office/Shantai Tiedeman re: doe  maint on no resp rx   Chief Complaint  Patient presents with   Shortness of Breath    F/u    Dyspnea:  worse when bend in over, using arms to tie a car down for  Cough: none  Sleeping: ok s    resp cc  SABA use: none  02: none   No obvious day to day or daytime variability or assoc excess/ purulent sputum or mucus plugs or hemoptysis or cp or chest tightness, subjective wheeze or overt sinus or hb symptoms.    Also denies any obvious fluctuation of symptoms with weather or environmental changes or other aggravating or alleviating factors except as outlined above   No unusual exposure hx or h/o childhood pna/ asthma or knowledge of premature birth.  Current Allergies, Complete Past Medical History, Past Surgical History, Family History, and Social History were reviewed in [employer] record.  ROS  The following are not active complaints unless  bolded Hoarseness, sore throat, dysphagia, dental problems, itching, sneezing,  nasal congestion or discharge of excess mucus or purulent secretions, ear ache,   fever, chills, sweats, unintended wt loss or wt gain, classically pleuritic or exertional cp,  orthopnea pnd or arm/hand swelling  or leg swelling, presyncope, palpitations, abdominal pain, anorexia, nausea, vomiting, diarrhea  or change in bowel habits or change in bladder habits, change in stools or change in urine, dysuria, hematuria,  rash, arthralgias, visual complaints, headache, numbness, weakness or ataxia or problems with walking or coordination,  change in mood or  memory.        Current Meds  Medication Sig   aspirin  EC 81 MG EC tablet Take 1 tablet (81 mg total) by mouth daily.   Cholecalciferol (VITAMIN D ) 50 MCG (2000 UT) CAPS Take 2,000 Units by mouth daily.   metoprolol  succinate (TOPROL -XL) 25 MG 24 hr tablet TAKE 1 TABLET BY MOUTH DAILY   nitroGLYCERIN  (NITROSTAT ) 0.4 MG SL tablet Place 1 tablet (0.4 mg total) under the tongue every 5 (five) minutes x 3 doses as needed for chest pain (if no relief after 2nd dose, proceed to ED or call 911).   olmesartan  (BENICAR ) 40 MG tablet Take 1 tablet (40  mg total) by mouth daily.   oxyCODONE -acetaminophen  (PERCOCET/ROXICET) 5-325 MG tablet Take 1 tablet by mouth every 4 (four) hours as needed.   pantoprazole  (PROTONIX ) 40 MG tablet TAKE 1 TABLET BY MOUTH 2 TIMES A DAY BEFORE A MEAL            Past Medical History:  Diagnosis Date   CAD (coronary artery disease) 11/27/2015   a. S/p NSTEMI 6/17 >> s/p CABG // b. LHC 6/17: oLAD 50, mLAD 100, D1 70, D2 95, oLCx 95, mLCx 65, OM1 85, OM2 90, mRCA 85, dRCA 75, RPDA 40/50, RPLB2 50    Carotid stenosis    a. Carotid US  6/17: bilat ICA 1-39%   Essential hypertension    GERD (gastroesophageal reflux disease)    History of non-ST elevation myocardial infarction (NSTEMI) 11/2015   Hyperlipidemia    Stroke (HCC)       Objective:      Wt Readings from Last 3 Encounters:  03/14/24 243 lb (110.2 kg)  03/05/24 243 lb (110.2 kg)  02/01/24 241 lb 3.2 oz (109.4 kg)      Vital signs reviewed  03/14/2024  - Note at rest 02 sats  98% on RA   General appearance:    amb very healthy appearing  mod obese (by BMI) wm nad      HEENT : Oropharynx  clear          NECK :  without  apparent JVD/ palpable Nodes/TM      LUNGS: no acc muscle use,  Nl contour chest which is clear to A and P bilaterally without cough on insp or exp maneuvers - no longer squeaky on inspiration    CV:  RRR  no s3 or murmur or increase in P2, and no edema   ABD:  soft and nontender   MS:  Gait nl   ext warm without deformities Or obvious joint restrictions  calf tenderness, cyanosis or clubbing    SKIN: warm and dry without lesions    NEURO:  alert, approp, nl sensorium with  no motor or cerebellar deficits apparent.     CXR PA and Lateral:   02/01/2024 :    I personally reviewed images and impression is as follows:     Mild reduction in lung vol, min kyphosis      Assessment   Assessment & Plan DOE (dyspnea on exertion) Onset around 2023  -11/25/15 PFTs nl @ 233 lbs  preop c/o doe then cleared up p cabg  -03/07/23  echo with G 2  diastolic dysfunction but nl la /RA - progressed to doe x walking to car (47ft) 02/01/2024  - 02/01/2024   Walked on RA  x  3  lap(s) =  approx 450   ft  @ mod  pace, stopped due to end of study  with lowest 02 sats 95% with mild sob   No findings to explain doe and cleared by cards so rec sub max ex x 30 min daily and consider CPST if needed    Essential hypertension D/c acei  03/14/24 due to unexplained doe and squeaky inspiration >  squeaking resolved off ACEi as of 03/14/2024   Although even in retrospect it may not be clear the ACEi contributed to the pt's symptoms,  Pt improved off them and adding them back at this point or in the future would risk confusion in interpretation of non-specific respiratory  symptoms to which this patient is prone  ie  Better not  to muddy the waters here.   >>> continue benicar  40 mg one daily and f/u with PCP/ cards as planned   Discussed in detail all the  indications, usual  risks and alternatives  relative to the benefits with patient who agrees to proceed with Rx as outlined.             Each maintenance medication was reviewed in detail including emphasizing most importantly the difference between maintenance and prns and under what circumstances the prns are to be triggered using an action plan format where appropriate.  Total time for H and P, chart review, counseling,  and generating customized AVS unique to this office visit / same day charting = 30 min summary final f/u ov          AVS  Patient Instructions  To get the most out of exercise, you need to be continuously aware that you are short of breath, but never out of breath, for at least 30 minutes most days.   As you improve, it will actually be easier for you to do the same amount of exercise  in  30 minutes so always push to the level where you are short of breath.      Call me in two weeks if not satisfied you are making progress with or regular exercise routine     If you are satisfied with your treatment plan,  let your doctor know and he/she can either refill your medications or you can return here when your prescription runs out.     If in any way you are not 100% satisfied,  please tell us .  If 100% better, tell your friends!  Pulmonary follow up is as needed        Ozell America, MD 03/14/2024

## 2024-03-14 NOTE — Assessment & Plan Note (Addendum)
 D/c acei  03/14/24 due to unexplained doe and squeaky inspiration >  squeaking resolved off ACEi as of 03/14/2024   Although even in retrospect it may not be clear the ACEi contributed to the pt's symptoms,  Pt improved off them and adding them back at this point or in the future would risk confusion in interpretation of non-specific respiratory symptoms to which this patient is prone  ie  Better not to muddy the waters here.   >>> continue benicar  40 mg one daily and f/u with PCP/ cards as planned   Discussed in detail all the  indications, usual  risks and alternatives  relative to the benefits with patient who agrees to proceed with Rx as outlined.             Each maintenance medication was reviewed in detail including emphasizing most importantly the difference between maintenance and prns and under what circumstances the prns are to be triggered using an action plan format where appropriate.  Total time for H and P, chart review, counseling,  and generating customized AVS unique to this office visit / same day charting = 30 min summary final f/u ov

## 2024-03-14 NOTE — Patient Instructions (Signed)
 To get the most out of exercise, you need to be continuously aware that you are short of breath, but never out of breath, for at least 30 minutes most days.   As you improve, it will actually be easier for you to do the same amount of exercise  in  30 minutes so always push to the level where you are short of breath.      Call me in two weeks if not satisfied you are making progress with or regular exercise routine     If you are satisfied with your treatment plan,  let your doctor know and he/she can either refill your medications or you can return here when your prescription runs out.     If in any way you are not 100% satisfied,  please tell us .  If 100% better, tell your friends!  Pulmonary follow up is as needed

## 2024-04-08 ENCOUNTER — Encounter: Payer: Self-pay | Admitting: Radiology

## 2024-04-18 ENCOUNTER — Ambulatory Visit: Admitting: Cardiology

## 2024-04-24 ENCOUNTER — Encounter (HOSPITAL_BASED_OUTPATIENT_CLINIC_OR_DEPARTMENT_OTHER): Payer: Self-pay | Admitting: Emergency Medicine

## 2024-04-24 ENCOUNTER — Emergency Department (HOSPITAL_BASED_OUTPATIENT_CLINIC_OR_DEPARTMENT_OTHER)
Admission: EM | Admit: 2024-04-24 | Discharge: 2024-04-24 | Discharge status: Against Medical Advice | Source: Home / Self Care

## 2024-04-24 ENCOUNTER — Emergency Department (HOSPITAL_BASED_OUTPATIENT_CLINIC_OR_DEPARTMENT_OTHER): Admitting: Radiology

## 2024-04-24 ENCOUNTER — Other Ambulatory Visit: Payer: Self-pay

## 2024-04-24 DIAGNOSIS — R079 Chest pain, unspecified: Secondary | ICD-10-CM | POA: Insufficient documentation

## 2024-04-24 DIAGNOSIS — R0602 Shortness of breath: Secondary | ICD-10-CM | POA: Insufficient documentation

## 2024-04-24 DIAGNOSIS — Z5321 Procedure and treatment not carried out due to patient leaving prior to being seen by health care provider: Secondary | ICD-10-CM | POA: Insufficient documentation

## 2024-04-24 DIAGNOSIS — Z951 Presence of aortocoronary bypass graft: Secondary | ICD-10-CM | POA: Insufficient documentation

## 2024-04-24 LAB — CBC
HCT: 48.1 % (ref 39.0–52.0)
Hemoglobin: 16.9 g/dL (ref 13.0–17.0)
MCH: 31.1 pg (ref 26.0–34.0)
MCHC: 35.1 g/dL (ref 30.0–36.0)
MCV: 88.4 fL (ref 80.0–100.0)
Platelets: 269 K/uL (ref 150–400)
RBC: 5.44 MIL/uL (ref 4.22–5.81)
RDW: 15 % (ref 11.5–15.5)
WBC: 6.6 K/uL (ref 4.0–10.5)
nRBC: 0 % (ref 0.0–0.2)

## 2024-04-24 LAB — BASIC METABOLIC PANEL WITH GFR
Anion gap: 14 (ref 5–15)
BUN: 16 mg/dL (ref 6–20)
CO2: 21 mmol/L — ABNORMAL LOW (ref 22–32)
Calcium: 9.8 mg/dL (ref 8.9–10.3)
Chloride: 103 mmol/L (ref 98–111)
Creatinine, Ser: 1.18 mg/dL (ref 0.61–1.24)
GFR, Estimated: >60 mL/min (ref >60)
Glucose, Bld: 162 mg/dL — ABNORMAL HIGH (ref 70–99)
Potassium: 4 mmol/L (ref 3.5–5.1)
Sodium: 138 mmol/L (ref 135–145)

## 2024-04-24 LAB — TROPONIN T, HIGH SENSITIVITY
Troponin T High Sensitivity: 20 ng/L — ABNORMAL HIGH (ref 0–19)
Troponin T High Sensitivity: 27 ng/L — ABNORMAL HIGH (ref 0–19)

## 2024-04-24 LAB — PROTIME-INR
INR: 1 (ref 0.8–1.2)
Prothrombin Time: 13.7 s (ref 11.4–15.2)

## 2024-04-24 NOTE — ED Notes (Signed)
 Pt called in to repeat Troponin. Pt reports he was leaving after blood being drawn. Pt IV removed. Pt advised by RN to stay but pt stated he would get his results by phone.

## 2024-04-24 NOTE — ED Triage Notes (Signed)
 Pt via pv from home with cp that began at 12:00 today. Pt has hx of CABG 8 years ago. Endorses SOB; denies n/v. Pt a&o x 4; nad noted.

## 2024-04-25 ENCOUNTER — Encounter (HOSPITAL_COMMUNITY): Payer: Self-pay

## 2024-04-25 ENCOUNTER — Encounter (HOSPITAL_COMMUNITY)
Admission: EM | Disposition: A | Discharge status: Home / Self Care | Payer: Self-pay | Source: Home / Self Care | Attending: Cardiology

## 2024-04-25 ENCOUNTER — Telehealth: Payer: Self-pay | Admitting: Cardiology

## 2024-04-25 ENCOUNTER — Other Ambulatory Visit: Payer: Self-pay

## 2024-04-25 ENCOUNTER — Inpatient Hospital Stay (HOSPITAL_COMMUNITY)
Admission: EM | Admit: 2024-04-25 | Discharge: 2024-04-26 | DRG: 281 | Disposition: A | Discharge status: Home / Self Care | Attending: Cardiology | Admitting: Cardiology

## 2024-04-25 DIAGNOSIS — I214 Non-ST elevation (NSTEMI) myocardial infarction: Secondary | ICD-10-CM | POA: Diagnosis not present

## 2024-04-25 DIAGNOSIS — I10 Essential (primary) hypertension: Secondary | ICD-10-CM | POA: Diagnosis present

## 2024-04-25 DIAGNOSIS — K219 Gastro-esophageal reflux disease without esophagitis: Secondary | ICD-10-CM | POA: Diagnosis present

## 2024-04-25 DIAGNOSIS — E785 Hyperlipidemia, unspecified: Secondary | ICD-10-CM | POA: Diagnosis present

## 2024-04-25 DIAGNOSIS — E782 Mixed hyperlipidemia: Secondary | ICD-10-CM

## 2024-04-25 DIAGNOSIS — I2510 Atherosclerotic heart disease of native coronary artery without angina pectoris: Secondary | ICD-10-CM | POA: Diagnosis not present

## 2024-04-25 DIAGNOSIS — R7303 Prediabetes: Secondary | ICD-10-CM | POA: Diagnosis present

## 2024-04-25 HISTORY — PX: LEFT HEART CATH AND CORS/GRAFTS ANGIOGRAPHY: CATH118250

## 2024-04-25 LAB — COMPREHENSIVE METABOLIC PANEL WITH GFR
ALT: 51 U/L — ABNORMAL HIGH (ref 0–44)
AST: 55 U/L — ABNORMAL HIGH (ref 15–41)
Albumin: 4.2 g/dL (ref 3.5–5.0)
Alkaline Phosphatase: 49 U/L (ref 38–126)
Anion gap: 12 (ref 5–15)
BUN: 17 mg/dL (ref 6–20)
CO2: 20 mmol/L — ABNORMAL LOW (ref 22–32)
Calcium: 8.9 mg/dL (ref 8.9–10.3)
Chloride: 102 mmol/L (ref 98–111)
Creatinine, Ser: 1.17 mg/dL (ref 0.61–1.24)
GFR, Estimated: >60 mL/min (ref >60)
Glucose, Bld: 139 mg/dL — ABNORMAL HIGH (ref 70–99)
Potassium: 3.8 mmol/L (ref 3.5–5.1)
Sodium: 134 mmol/L — ABNORMAL LOW (ref 135–145)
Total Bilirubin: 1.9 mg/dL — ABNORMAL HIGH (ref 0.0–1.2)
Total Protein: 6.9 g/dL (ref 6.5–8.1)

## 2024-04-25 LAB — CBC
HCT: 46.1 % (ref 39.0–52.0)
Hemoglobin: 15.7 g/dL (ref 13.0–17.0)
MCH: 30.5 pg (ref 26.0–34.0)
MCHC: 34.1 g/dL (ref 30.0–36.0)
MCV: 89.5 fL (ref 80.0–100.0)
Platelets: 208 K/uL (ref 150–400)
RBC: 5.15 MIL/uL (ref 4.22–5.81)
RDW: 14.9 % (ref 11.5–15.5)
WBC: 6.7 K/uL (ref 4.0–10.5)
nRBC: 0 % (ref 0.0–0.2)

## 2024-04-25 LAB — CBC WITH DIFFERENTIAL/PLATELET
Abs Immature Granulocytes: 0.01 K/uL (ref 0.00–0.07)
Basophils Absolute: 0.1 K/uL (ref 0.0–0.1)
Basophils Relative: 1 %
Eosinophils Absolute: 0.3 K/uL (ref 0.0–0.5)
Eosinophils Relative: 5 %
HCT: 49.3 % (ref 39.0–52.0)
Hemoglobin: 16.8 g/dL (ref 13.0–17.0)
Immature Granulocytes: 0 %
Lymphocytes Relative: 25 %
Lymphs Abs: 1.7 K/uL (ref 0.7–4.0)
MCH: 30.8 pg (ref 26.0–34.0)
MCHC: 34.1 g/dL (ref 30.0–36.0)
MCV: 90.5 fL (ref 80.0–100.0)
Monocytes Absolute: 0.6 K/uL (ref 0.1–1.0)
Monocytes Relative: 9 %
Neutro Abs: 4.2 K/uL (ref 1.7–7.7)
Neutrophils Relative %: 60 %
Platelets: 267 K/uL (ref 150–400)
RBC: 5.45 MIL/uL (ref 4.22–5.81)
RDW: 15 % (ref 11.5–15.5)
WBC: 6.9 K/uL (ref 4.0–10.5)
nRBC: 0 % (ref 0.0–0.2)

## 2024-04-25 LAB — TROPONIN I (HIGH SENSITIVITY)
Troponin I (High Sensitivity): 2449 ng/L (ref <18)
Troponin I (High Sensitivity): 2197 ng/L (ref <18)

## 2024-04-25 LAB — CREATININE, SERUM
Creatinine, Ser: 1.28 mg/dL — ABNORMAL HIGH (ref 0.61–1.24)
GFR, Estimated: >60 mL/min (ref >60)

## 2024-04-25 MED ORDER — ISOSORBIDE MONONITRATE ER 30 MG PO TB24
30.0000 mg | ORAL_TABLET | Freq: Every day | ORAL | Status: DC
  Administered 2024-04-25 – 2024-04-26 (×2): 30 mg via ORAL
  Filled 2024-04-25 (×2): qty 1

## 2024-04-25 MED ORDER — HEPARIN SODIUM (PORCINE) 1000 UNIT/ML IJ SOLN
INTRAMUSCULAR | Status: AC
  Filled 2024-04-25: qty 10

## 2024-04-25 MED ORDER — HEPARIN BOLUS VIA INFUSION
4000.0000 [IU] | Freq: Once | INTRAVENOUS | Status: AC
  Administered 2024-04-25: 4000 [IU] via INTRAVENOUS
  Filled 2024-04-25: qty 4000

## 2024-04-25 MED ORDER — ASPIRIN 81 MG PO TBEC
81.0000 mg | DELAYED_RELEASE_TABLET | Freq: Every day | ORAL | Status: DC
  Administered 2024-04-26: 81 mg via ORAL
  Filled 2024-04-25: qty 1

## 2024-04-25 MED ORDER — NITROGLYCERIN 0.4 MG SL SUBL: 0.4000 mg | SUBLINGUAL_TABLET | SUBLINGUAL | Status: DC | PRN

## 2024-04-25 MED ORDER — PANTOPRAZOLE SODIUM 40 MG PO TBEC
40.0000 mg | DELAYED_RELEASE_TABLET | Freq: Two times a day (BID) | ORAL | Status: DC
  Administered 2024-04-25 – 2024-04-26 (×2): 40 mg via ORAL
  Filled 2024-04-25 (×2): qty 1

## 2024-04-25 MED ORDER — SODIUM CHLORIDE 0.9 % IV SOLN
INTRAVENOUS | Status: AC | PRN
  Administered 2024-04-25: 10 mL/h via INTRAVENOUS

## 2024-04-25 MED ORDER — HEPARIN (PORCINE) IN NACL 2000-0.9 UNIT/L-% IV SOLN
INTRAVENOUS | Status: DC | PRN
  Administered 2024-04-25: 1000 mL

## 2024-04-25 MED ORDER — FENTANYL CITRATE (PF) 100 MCG/2ML IJ SOLN
INTRAMUSCULAR | Status: DC | PRN
  Administered 2024-04-25 (×2): 25 ug via INTRAVENOUS

## 2024-04-25 MED ORDER — ENOXAPARIN SODIUM 40 MG/0.4ML IJ SOSY
40.0000 mg | PREFILLED_SYRINGE | INTRAMUSCULAR | Status: DC
  Administered 2024-04-26: 40 mg via SUBCUTANEOUS
  Filled 2024-04-25: qty 0.4

## 2024-04-25 MED ORDER — METOPROLOL SUCCINATE ER 25 MG PO TB24
25.0000 mg | ORAL_TABLET | Freq: Every day | ORAL | Status: DC
  Administered 2024-04-26: 25 mg via ORAL
  Filled 2024-04-25: qty 1

## 2024-04-25 MED ORDER — IOHEXOL 350 MG/ML SOLN
INTRAVENOUS | Status: DC | PRN
Start: 2024-04-25 — End: 2024-04-25
  Administered 2024-04-25: 55 mL

## 2024-04-25 MED ORDER — VITAMIN D 25 MCG (1000 UNIT) PO TABS
2000.0000 [IU] | ORAL_TABLET | Freq: Every day | ORAL | Status: DC
  Administered 2024-04-26: 2000 [IU] via ORAL
  Filled 2024-04-25: qty 2

## 2024-04-25 MED ORDER — ASPIRIN 325 MG PO TBEC
325.0000 mg | DELAYED_RELEASE_TABLET | Freq: Once | ORAL | Status: AC
  Administered 2024-04-25: 325 mg via ORAL
  Filled 2024-04-25: qty 1

## 2024-04-25 MED ORDER — ASPIRIN 300 MG RE SUPP: 300.0000 mg | RECTAL | Status: DC

## 2024-04-25 MED ORDER — VERAPAMIL HCL 2.5 MG/ML IV SOLN
INTRAVENOUS | Status: AC
  Filled 2024-04-25: qty 2

## 2024-04-25 MED ORDER — LIDOCAINE HCL (PF) 1 % IJ SOLN
INTRAMUSCULAR | Status: DC | PRN
Start: 2024-04-25 — End: 2024-04-25
  Administered 2024-04-25: 2 mL

## 2024-04-25 MED ORDER — HYDRALAZINE HCL 20 MG/ML IJ SOLN: 10.0000 mg | INTRAMUSCULAR | Status: AC | PRN

## 2024-04-25 MED ORDER — SODIUM CHLORIDE 0.9% FLUSH
3.0000 mL | Freq: Two times a day (BID) | INTRAVENOUS | Status: DC
  Administered 2024-04-25 – 2024-04-26 (×2): 3 mL via INTRAVENOUS

## 2024-04-25 MED ORDER — MIDAZOLAM HCL (PF) 2 MG/2ML IJ SOLN
INTRAMUSCULAR | Status: DC | PRN
  Administered 2024-04-25: 1 mg via INTRAVENOUS

## 2024-04-25 MED ORDER — SODIUM CHLORIDE 0.9 % IV SOLN: 250.0000 mL | INTRAVENOUS | Status: DC | PRN

## 2024-04-25 MED ORDER — IRBESARTAN 75 MG PO TABS
75.0000 mg | ORAL_TABLET | Freq: Every day | ORAL | Status: DC
  Administered 2024-04-26: 75 mg via ORAL
  Filled 2024-04-25: qty 1

## 2024-04-25 MED ORDER — FENTANYL CITRATE (PF) 100 MCG/2ML IJ SOLN
INTRAMUSCULAR | Status: AC
  Filled 2024-04-25: qty 2

## 2024-04-25 MED ORDER — VERAPAMIL HCL 2.5 MG/ML IV SOLN
INTRAVENOUS | Status: DC | PRN
  Administered 2024-04-25: 10 mL via INTRA_ARTERIAL

## 2024-04-25 MED ORDER — CLOPIDOGREL BISULFATE 300 MG PO TABS
ORAL_TABLET | ORAL | Status: DC | PRN
Start: 2024-04-25 — End: 2024-04-25
  Administered 2024-04-25: 300 mg via ORAL

## 2024-04-25 MED ORDER — ONDANSETRON HCL 4 MG/2ML IJ SOLN: 4.0000 mg | Freq: Four times a day (QID) | INTRAMUSCULAR | Status: DC | PRN

## 2024-04-25 MED ORDER — LABETALOL HCL 5 MG/ML IV SOLN: 10.0000 mg | INTRAVENOUS | Status: AC | PRN

## 2024-04-25 MED ORDER — ASPIRIN 81 MG PO CHEW: 324.0000 mg | CHEWABLE_TABLET | ORAL | Status: DC

## 2024-04-25 MED ORDER — ACETAMINOPHEN 325 MG PO TABS: 650.0000 mg | ORAL_TABLET | ORAL | Status: DC | PRN

## 2024-04-25 MED ORDER — FREE WATER: 500.0000 mL | Freq: Once | Status: DC

## 2024-04-25 MED ORDER — SODIUM CHLORIDE 0.9% FLUSH
3.0000 mL | INTRAVENOUS | Status: DC | PRN
  Administered 2024-04-25: 3 mL via INTRAVENOUS

## 2024-04-25 MED ORDER — CLOPIDOGREL BISULFATE 75 MG PO TABS
75.0000 mg | ORAL_TABLET | Freq: Every day | ORAL | Status: DC
  Administered 2024-04-26: 75 mg via ORAL
  Filled 2024-04-25: qty 1

## 2024-04-25 MED ORDER — HEPARIN SODIUM (PORCINE) 1000 UNIT/ML IJ SOLN
INTRAMUSCULAR | Status: DC | PRN
  Administered 2024-04-25: 5000 [IU] via INTRAVENOUS

## 2024-04-25 MED ORDER — LIDOCAINE HCL (PF) 1 % IJ SOLN
INTRAMUSCULAR | Status: AC
Start: 2024-04-25 — End: 2024-04-25
  Filled 2024-04-25: qty 30

## 2024-04-25 MED ORDER — HEPARIN (PORCINE) 25000 UT/250ML-% IV SOLN
1300.0000 [IU]/h | INTRAVENOUS | Status: DC
  Administered 2024-04-25: 1300 [IU]/h via INTRAVENOUS
  Filled 2024-04-25: qty 250

## 2024-04-25 MED ORDER — MIDAZOLAM HCL 2 MG/2ML IJ SOLN
INTRAMUSCULAR | Status: AC
  Filled 2024-04-25: qty 2

## 2024-04-25 NOTE — H&P (Addendum)
 Cardiology Admission History and Physical  As below, patient seen and examined.  Briefly he is a 58 year old male with past medical history of coronary artery disease status post coronary bypass and graft, hypertension, hyperlipidemia, prior CVA with non-ST elevation myocardial infarction.  Patient is status post coronary artery bypass and graft in 2017 with LIMA to the LAD, saphenous vein graft to the diagonal, saphenous vein graft to OM1/OM 2 and saphenous vein graft to the PDA.  Over the past 2 months he has noticed the fatigue and increased dyspnea on exertion similar to symptoms prior to surgery.  Yesterday he had 30 minutes of left-sided chest pain described as a pressure with some radiation to his left upper extremity.  He went to [facility] and his initial troponin was 20 and increased to 27.  He left AGAINST MEDICAL ADVICE due to weight and follow-up troponin had increased.  He remains pain-free.  Follow-up troponin 2449 and 2197.  Creatinine 1.17.  Hemoglobin 16.8.  Electrocardiogram shows sinus rhythm, RV conduction delay, nonspecific ST changes.  1 non-ST elevation myocardial infarction-we will treat with aspirin , heparin  and metoprolol .  He is intolerant to statins.  Plan cardiac catheterization.  The risk and benefits including myocardial infarction, CVA and death discussed and he agrees to proceed.  Check echocardiogram for LV function.  2 hyperlipidemia-check baseline lipids.  He is intolerant to statins.  If LDL greater than 55 would refer for PCSK9 inhibitor.  3 hypertension-would resume home medications.  Monitor blood pressure and advance as needed.  Redell Shallow, MD  Patient ID: Justin Villa MRN: [id number]; DOB: 1966-01-01   Admission date: 04/25/2024  PCP:  Glennon Sand, NP (Inactive)   [facility] Providers Cardiologist:  Jayson Sierras, MD      Chief Complaint:  Chest Pain   Patient Profile: Justin Villa is a 58 y.o. male with a past medical  history of CAD s/p CABG, HTN, GERD, HLD, history of CVA who is being seen 04/25/2024 for the evaluation of chest pain.   History of Present Illness: Mr. Lares is a 58 year old male with above medical history who has been followed by Dr. Sierras.   Patient previously had CABG in 11/2015 with LIMA-LAD, SVG-diagonal, SVG to OM1 and OM2, and SVG-PDA. Echocardiogram at that time showed EF 60-65%, mild LVH, no regional wall motion abnormalities, grade I DD, mild mitral regurgitation.  Later, a stress test in 11/2019 was a normal, low risk study   Patient had echocardiogram in 03/2023 that showed EF 60-65%, no regional wall motion abnormalities, grade II DD, mildly reduced RV systolic function, mild dilatation of the ascending aorta measuring 38 mm. Stress test in 03/2023 was a normal, low risk study.  Patient was seen at the [facility] ED on 11/19 complaining of chest pain. hsTn 20>27. Patient left AMA after troponins were collected. He presented back to the ED at [facility] on 11/20 complaining of chest pain. BP 128/103. hsTn 2449. Creatinine 1.17.   On interview, patient tells me that he has had progressive shortness of breath on exertion for several months. Yesterday he was sitting in his car talking on the phone when he developed left sided chest pressure. Radiated down his left arm. Lasted about 30 minutes then resolved on its own. He went to the ED to have troponins drawn. Because his chest pain had resolved, he decided to leave the ED before his workup was completed. He called the office and he was told to return to the ED for  further workup. He had a few mild episodes of chest pain throughout the day today, but nothing significant. Reports that he had similar shortness of breath on exertion prior to his CABG in 2017. Currently comfortable. No chest pain. Breathing OK at rest    Past Medical History:  Diagnosis Date   CAD (coronary artery disease) 11/27/2015   a. S/p NSTEMI 6/17 >> s/p CABG // b. LHC  6/17: oLAD 50, mLAD 100, D1 70, D2 95, oLCx 95, mLCx 65, OM1 85, OM2 90, mRCA 85, dRCA 75, RPDA 40/50, RPLB2 50    Carotid stenosis    a. Carotid US  6/17: bilat ICA 1-39%   Essential hypertension    GERD (gastroesophageal reflux disease)    History of non-ST elevation myocardial infarction (NSTEMI) 11/2015   Hyperlipidemia    Stroke [facility])    Past Surgical History:  Procedure Laterality Date   AMPUTATION Right 08/23/2023   Procedure: AMPUTATION, FOOT, RAY;  Surgeon: Harden Jerona GAILS, MD;  Location: [facility] OR;  Service: Orthopedics;  Laterality: Right;  RIGHT 5TH RAY AMPUTATION   BACK SURGERY     BIOPSY  08/25/2022   Procedure: BIOPSY;  Surgeon: Cindie Carlin POUR, DO;  Location: [facility] ENDO SUITE;  Service: Endoscopy;;   CARDIAC CATHETERIZATION N/A 11/26/2015   Procedure: Left Heart Cath and Coronary Angiography;  Surgeon: Victory LELON Sharps, MD;  Location: [facility] INVASIVE CV LAB;  Service: Cardiovascular;  Laterality: N/A;   CARPAL TUNNEL RELEASE Left 10/19/2022   Procedure: CARPAL TUNNEL RELEASE;  Surgeon: Margrette Taft BRAVO, MD;  Location: [facility] ORS;  Service: Orthopedics;  Laterality: Left;   COLONOSCOPY WITH PROPOFOL  N/A 08/25/2022   Procedure: COLONOSCOPY WITH PROPOFOL ;  Surgeon: Cindie Carlin POUR, DO;  Location: [facility] ENDO SUITE;  Service: Endoscopy;  Laterality: N/A;  10:30am, asa 3   CORONARY ARTERY BYPASS GRAFT N/A 11/27/2015   Procedure: CORONARY ARTERY BYPASS GRAFTING (CABG) x 5 (LIMA to LAD, SVG to DIAGONAL, SVG SEQUENTIALLY to OM1 and OM2, SVG to PDA) with EVH from right leg using greater saphenous vein and mammary.;  Surgeon: Dallas KATHEE Jude, MD;  Location: [facility] OR;  Service: Open Heart Surgery;  Laterality: N/A;   ESOPHAGOGASTRODUODENOSCOPY (EGD) WITH PROPOFOL  N/A 08/25/2022   Procedure: ESOPHAGOGASTRODUODENOSCOPY (EGD) WITH PROPOFOL ;  Surgeon: Cindie Carlin POUR, DO;  Location: [facility] END[facility] SUITE;  Service: Endoscopy;  Laterality: N/A;   INCISION AND DRAINAGE ABSCESS Right 06/16/2020   Procedure: INCISION AND  DRAINAGE ABSCESS RIGHT PINKY TOE (5th toe);  Surgeon: Margrette Taft BRAVO, MD;  Location: [facility] ORS;  Service: Orthopedics;  Laterality: Right;   INTRAOPERATIVE TRANSESOPHAGEAL ECHOCARDIOGRAM N/A 11/27/2015   Procedure: INTRAOPERATIVE TRANSESOPHAGEAL ECHOCARDIOGRAM;  Surgeon: Dallas KATHEE Jude, MD;  Location: [facility] OR;  Service: Open Heart Surgery;  Laterality: N/A;   KNEE SURGERY Left    arthroscopy   POLYPECTOMY  08/25/2022   Procedure: POLYPECTOMY;  Surgeon: Cindie Carlin POUR, DO;  Location: [facility];  Service: Endoscopy;;     Medications Prior to Admission: Prior to Admission medications   Medication Sig Start Date End Date Taking? Authorizing Provider  aspirin  EC 81 MG EC tablet Take 1 tablet (81 mg total) by mouth daily. 12/01/15  Yes Dwan Aldo M, PA-C  Cholecalciferol  (VITAMIN D ) 50 MCG (2000 UT) CAPS Take 2,000 Units by mouth daily.   Yes [provider]  metoprolol  succinate (TOPROL -XL) 25 MG 24 hr tablet TAKE 1 TABLET BY MOUTH DAILY 08/03/23  Yes Debera Jayson MATSU, MD  olmesartan  (BENICAR ) 40 MG tablet Take 1  tablet (40 mg total) by mouth daily. 02/01/24 01/31/25 Yes Darlean Ozell NOVAK, MD  pantoprazole  (PROTONIX ) 40 MG tablet TAKE 1 TABLET BY MOUTH 2 TIMES A DAY BEFORE A MEAL 08/07/23  Yes Shirlean Therisa ORN, NP  nitroGLYCERIN  (NITROSTAT ) 0.4 MG SL tablet Place 1 tablet (0.4 mg total) under the tongue every 5 (five) minutes x 3 doses as needed for chest pain (if no relief after 2nd dose, proceed to ED or call 911). 10/06/21   Debera Jayson MATSU, MD  oxyCODONE -acetaminophen  (PERCOCET/ROXICET) 5-325 MG tablet Take 1 tablet by mouth every 4 (four) hours as needed. Patient not taking: Reported on 04/25/2024 08/23/23   Harden Jerona GAILS, MD     Allergies:   No Known Allergies  Social History:   Social History   Socioeconomic History   Marital status: Single    Spouse name: Not on file   Number of children: Not on file   Years of education: Not on file   Highest education level:  Not on file  Occupational History   Not on file  Tobacco Use   Smoking status: Never   Smokeless tobacco: Never  Vaping Use   Vaping status: Never Used  Substance and Sexual Activity   Alcohol use: Yes    Alcohol/week: 2.0 standard drinks of alcohol    Types: 2 Shots of liquor per week    Comment: everyother day   Drug use: No   Sexual activity: Yes  Other Topics Concern   Not on file  Social History Narrative   Not on file   Social Drivers of Health   Financial Resource Strain: Not on file  Food Insecurity: Not on file  Transportation Needs: Not on file  Physical Activity: Not on file  Stress: Not on file  Social Connections: Not on file  Intimate Partner Violence: Not on file     Family History:   The patient's family history includes Heart disease in his father. There is no history of Colon cancer.    ROS:  Please see the history of present illness.  All other ROS reviewed and negative.     Physical Exam/Data: Vitals:   04/25/24 1158 04/25/24 1204 04/25/24 1346  BP: (!) 128/103  (!) 132/99  Pulse: (!) 58  60  Resp: 18  18  Temp: 98 F (36.7 C)  98.2 F (36.8 C)  SpO2: 98%  100%  Weight:  106.6 kg   Height:  6' (1.829 m)    No intake or output data in the 24 hours ending 04/25/24 1529    04/25/2024   12:04 PM 04/24/2024    4:43 PM 03/14/2024   11:13 AM  Last 3 Weights  Weight (lbs) 235 lb 0.2 oz 235 lb 243 lb  Weight (kg) 106.6 kg 106.595 kg 110.224 kg     Body mass index is 31.87 kg/m.  General:  Well nourished, well developed, in no acute distress. Laying comfortably in bed in no acute distress  HEENT: normal Neck: no  JVD Vascular: Radial pulses 2+ bilaterally   Cardiac:  normal S1, S2; RRR; no murmur   Lungs:  clear to auscultation bilaterally, no wheezing, rhonchi or rales  Abd: soft, nontender,  Ext: no  edema Musculoskeletal:  No deformities  Skin: warm and dry  Neuro:  CNs 2-12 intact, no focal abnormalities noted Psych:  Normal  affect   EKG:  The ECG that was done 04/25/24 was personally reviewed and demonstrates NSR, LAFB  Relevant CV Studies: Cardiac  Studies & Procedures   ______________________________________________________________________________________________ CARDIAC CATHETERIZATION  CARDIAC CATHETERIZATION 11/26/2015  Conclusion 1. Mid RCA lesion, 85% stenosed. 2. 1st Mrg lesion, 85% stenosed. 3. Ost Cx to Mid Cx lesion, 95% stenosed. 4. Ost 2nd Mrg to 2nd Mrg lesion, 90% stenosed. 5. Mid Cx to Dist Cx lesion, 65% stenosed. 6. Ost 1st Diag to 1st Diag lesion, 70% stenosed. 7. Ost 2nd Diag to 2nd Diag lesion, 95% stenosed. 8. Mid LAD to Dist LAD lesion, 100% stenosed. 9. Ost LAD to Mid LAD lesion, 50% stenosed. 10. Dist RCA lesion, 75% stenosed. 11. RPDA-1 lesion, 40% stenosed. 12. RPDA-2 lesion, 50% stenosed. 13. 2nd RPLB lesion, 50% stenosed.   Severe three-vessel coronary disease as outlined above with totally occluded mid LAD high-grade obstruction in large diagonal branches, high-grade obstruction in the proximal circumflex in each obtuse marginal branch, and high-grade obstruction in the mid and distal RCA. The RCA supplies the LAD via septal perforator collaterals.  Normal left ventricular systolic function. Normal left ventricular filling pressures.   RECOMMENDATIONS:   Cardiovascular surgical evaluation for multivessel bypass surgery.  Resume heparin .  Remain hospitalized until surgery can be performed.  Findings Coronary Findings Diagnostic  Dominance: Right  Left Anterior Descending  First Diagonal Branch The vessel is moderate in size. The lesion is type C.  Second Diagonal Branch The vessel is large in size.  Ramus Intermedius . Vessel is small.  Left Circumflex Tubular.  First Obtuse Marginal Branch The vessel is small in size.  Second Obtuse Marginal Branch  Right Coronary Artery The lesion is type C. The lesion is type C.  Right Posterior  Descending Artery  First Right Posterolateral Branch The vessel is small in size.  Second Right Posterolateral Branch  Intervention  No interventions have been documented.   STRESS TESTS  NM MYOCAR MULTI W/SPECT W 03/07/2023  Narrative   Findings are consistent with no ischemia. The study is low risk.   No ST deviation was noted. The ECG was negative for ischemia.   LV perfusion is normal.  Small, mild intensity, fixed apical defect most consistent with soft tissue attenuation given normal wall motion on gated imaging.  No clear ischemic territories.   Left ventricular function is normal. Nuclear stress EF: 58%. End diastolic cavity size is normal. End systolic cavity size is normal.  Low risk study with soft tissue attenuation affecting the apex, no definite ischemia, LVEF 58%.   ECHOCARDIOGRAM  ECHOCARDIOGRAM COMPLETE 03/07/2023  Narrative ECHOCARDIOGRAM REPORT    Patient Name:   CRISPIN VOGEL Date of Exam: 03/07/2023 Medical Rec #:  [id number]        Height:       72.0 in Accession #:    [id number]       Weight:       243.2 lb Date of Birth:  03/27/1966         BSA:          2.315 m Patient Age:    57 years         BP:           147/88 mmHg Patient Gender: M                HR:           53 bpm. Exam Location:  [facility]  Procedure: 2D Echo, Cardiac Doppler, Color Doppler and Strain Analysis  Indications:    R06.02 SOB  History:        Patient has prior  history of Echocardiogram examinations, most recent 11/28/2015. Previous Myocardial Infarction and CAD, Signs/Symptoms:Dyspnea and Shortness of Breath; Risk Factors:Hypertension and Dyslipidemia.  Sonographer:    Ellouise Mose RDCS Referring Phys: [id number] SAMUEL G MCDOWELL  IMPRESSIONS   1. Left ventricular ejection fraction, by estimation, is 60 to 65%. The left ventricle has normal function. The left ventricle has no regional wall motion abnormalities. There is moderate concentric left ventricular hypertrophy. Left  ventricular diastolic parameters are consistent with Grade II diastolic dysfunction (pseudonormalization). The average left ventricular global longitudinal strain is -22.1 %. The global longitudinal strain is normal. 2. Right ventricular systolic function is mildly reduced. The right ventricular size is normal. Tricuspid regurgitation signal is inadequate for assessing PA pressure. 3. The mitral valve is grossly normal, mildly calcified. Trivial mitral valve regurgitation. 4. The aortic valve is tricuspid. There is mild calcification of the aortic valve. Aortic valve regurgitation is not visualized. No aortic stenosis is present. 5. Aortic dilatation noted. There is mild dilatation of the ascending aorta, measuring 38 mm. 6. The inferior vena cava is normal in size with greater than 50% respiratory variability, suggesting right atrial pressure of 3 mmHg.  Comparison(s): Prior images unable to be directly viewed.  FINDINGS Left Ventricle: Left ventricular ejection fraction, by estimation, is 60 to 65%. The left ventricle has normal function. The left ventricle has no regional wall motion abnormalities. The average left ventricular global longitudinal strain is -22.1 %. The global longitudinal strain is normal. The left ventricular internal cavity size was normal in size. There is moderate concentric left ventricular hypertrophy. Left ventricular diastolic parameters are consistent with Grade II diastolic dysfunction (pseudonormalization).  Right Ventricle: The right ventricular size is normal. No increase in right ventricular wall thickness. Right ventricular systolic function is mildly reduced. Tricuspid regurgitation signal is inadequate for assessing PA pressure.  Left Atrium: Left atrial size was normal in size.  Right Atrium: Right atrial size was normal in size.  Pericardium: There is no evidence of pericardial effusion.  Mitral Valve: The mitral valve is grossly normal. There is mild  calcification of the mitral valve leaflet(s). Trivial mitral valve regurgitation.  Tricuspid Valve: The tricuspid valve is grossly normal. Tricuspid valve regurgitation is trivial. No evidence of tricuspid stenosis.  Aortic Valve: The aortic valve is tricuspid. There is mild calcification of the aortic valve. There is mild aortic valve annular calcification. Aortic valve regurgitation is not visualized. No aortic stenosis is present.  Pulmonic Valve: The pulmonic valve was grossly normal. Pulmonic valve regurgitation is trivial. No evidence of pulmonic stenosis.  Aorta: Aortic dilatation noted. There is mild dilatation of the ascending aorta, measuring 38 mm.  Venous: The inferior vena cava is normal in size with greater than 50% respiratory variability, suggesting right atrial pressure of 3 mmHg.  IAS/Shunts: No atrial level shunt detected by color flow Doppler.   LEFT VENTRICLE PLAX 2D LVIDd:         4.70 cm     Diastology LVIDs:         3.10 cm     LV e' medial:    6.74 cm/s LV PW:         1.60 cm     LV E/e' medial:  14.2 LV IVS:        1.70 cm     LV e' lateral:   9.03 cm/s LVOT diam:     2.40 cm     LV E/e' lateral: 10.6 LV SV:  95 LV SV Index:   41          2D Longitudinal Strain LVOT Area:     4.52 cm    2D Strain GLS Avg:     -22.1 %  LV Volumes (MOD) LV vol d, MOD A2C: 87.5 ml LV vol d, MOD A4C: 92.3 ml LV vol s, MOD A2C: 22.3 ml LV vol s, MOD A4C: 39.0 ml LV SV MOD A2C:     65.2 ml LV SV MOD A4C:     92.3 ml LV SV MOD BP:      57.3 ml  RIGHT VENTRICLE            IVC RV S prime:     8.16 cm/s  IVC diam: 1.40 cm TAPSE (M-mode): 1.7 cm  LEFT ATRIUM             Index        RIGHT ATRIUM          Index LA diam:        4.10 cm 1.77 cm/m   RA Area:     9.48 cm LA Vol (A2C):   36.5 ml 15.77 ml/m  RA Volume:   16.50 ml 7.13 ml/m LA Vol (A4C):   25.5 ml 11.01 ml/m LA Biplane Vol: 31.9 ml 13.78 ml/m AORTIC VALVE LVOT Vmax:   102.00 cm/s LVOT Vmean:  60.600  cm/s LVOT VTI:    0.210 m  AORTA Ao Root diam: 3.70 cm Ao Asc diam:  3.80 cm  MITRAL VALVE MV Area (PHT): 3.17 cm    SHUNTS MV Decel Time: 239 msec    Systemic VTI:  0.21 m MV E velocity: 95.50 cm/s  Systemic Diam: 2.40 cm MV A velocity: 85.10 cm/s MV E/A ratio:  1.12  Jayson Sierras MD Electronically signed by Jayson Sierras MD Signature Date/Time: 03/07/2023/10:45:22 AM    Final          ______________________________________________________________________________________________       Laboratory Data: High Sensitivity Troponin:   Recent Labs  Lab 04/25/24 1214 04/25/24 1358  TROPONINIHS 2,449* 2,197*      Chemistry Recent Labs  Lab 04/24/24 1712 04/25/24 1214  NA 138 134*  K 4.0 3.8  CL 103 102  CO2 21* 20*  GLUCOSE 162* 139*  BUN 16 17  CREATININE 1.18 1.17  CALCIUM  9.8 8.9  GFRNONAA >60 >60  ANIONGAP 14 12    Recent Labs  Lab 04/25/24 1214  PROT 6.9  ALBUMIN  4.2  AST 55*  ALT 51*  ALKPHOS 49  BILITOT 1.9*   Lipids No results for input(s): CHOL, TRIG, HDL, LABVLDL, LDLCALC, CHOLHDL in the last 168 hours. Hematology Recent Labs  Lab 04/24/24 1712 04/25/24 1214  WBC 6.6 6.9  RBC 5.44 5.45  HGB 16.9 16.8  HCT 48.1 49.3  MCV 88.4 90.5  MCH 31.1 30.8  MCHC 35.1 34.1  RDW 15.0 15.0  PLT 269 267   Thyroid  No results for input(s): TSH, FREET4 in the last 168 hours. BNPNo results for input(s): BNP, PROBNP in the last 168 hours.  DDimer No results for input(s): DDIMER in the last 168 hours.  Radiology/Studies:  DG Chest 2 View Result Date: 04/24/2024 CLINICAL DATA:  Chest pain EXAM: CHEST - 2 VIEW COMPARISON:  Chest x-ray 02/01/2024 FINDINGS: Sternotomy wires are again seen. The heart size and mediastinal contours are within normal limits. Both lungs are clear. The visualized skeletal structures are unremarkable. IMPRESSION: No active cardiopulmonary disease. Electronically Signed  By: Greig Pique M.D.   On:  04/24/2024 17:35     Assessment and Plan:  NSTEMI  CAD s/p CABG  - CABG in 11/2015 with LIMA-LAD, SVG-diagonal, SVG to OM1 and OM2, and SVG-PDA - Patient had a 30 minute episode of chest pain yesterday. Felt like pressure, radiated to his left arm. He has also been having progressive shortness of breath on exertion for several months  - hsTn 20>27 yesterday. Patient had left the ED yesterday because his chest pain had resolved. Was instructed by our office to come back to ED - hsTn [phone] - EKG without ischemic changes  - Presentation concerning for NSTEMI  - Continue IV heparin   - Plan for LHC today  - Continue ASA 81 mg daily - Continue metoprolol  succinate 25 mg daily  - Previously did not tolerate lipitor . Ordered lipid panel to be collected in the AM   HTN  - BP a bit elevated in the ED  - Continue metoprolol  succinate 25 mg daily, irbesartan 75 mg daily   HLD  - Ordered lipid panel in the AM as above   Risk Assessment/Risk Scores:  TIMI Risk Score for Unstable Angina or Non-ST Elevation MI:   The patient's TIMI risk score is 4, which indicates a 20% risk of all cause mortality, new or recurrent myocardial infarction or need for urgent revascularization in the next 14 days.     Code Status: Full Code  Severity of Illness: The appropriate patient status for this patient is INPATIENT. Inpatient status is judged to be reasonable and necessary in order to provide the required intensity of service to ensure the patient's safety. The patient's presenting symptoms, physical exam findings, and initial radiographic and laboratory data in the context of their chronic comorbidities is felt to place them at high risk for further clinical deterioration. Furthermore, it is not anticipated that the patient will be medically stable for discharge from the hospital within 2 midnights of admission.   * I certify that at the point of admission it is my clinical judgment that the patient  will require inpatient hospital care spanning beyond 2 midnights from the point of admission due to high intensity of service, high risk for further deterioration and high frequency of surveillance required.*  For questions or updates, please contact [facility] Please consult [email or web address] for contact info under     Signed, Rollo FABIENE Louder, PA-C  04/25/2024 3:29 PM

## 2024-04-25 NOTE — ED Provider Notes (Signed)
 [place] EMERGENCY DEPARTMENT AT [facility] Provider Note   CSN: [id number] Arrival date & time: 04/25/24  1154     Patient presents with: Chest Pain   Justin Villa is a 58 y.o. male.   Pt is a 58 yo male with pmhx significant for CAD s/p CABG, GERD, HTN, CVA, and HLD.  He started having chest pressure around 4 pm yesterday.  He initially went to [facility], but it was busy and he waited for a few hours and left without being seen.  He did have blood work drawn which showed troponins of 20 and 27.  He still has some pressure, so he came here this am.         Prior to Admission medications   Medication Sig Start Date End Date Taking? Authorizing Provider  aspirin  EC 81 MG EC tablet Take 1 tablet (81 mg total) by mouth daily. 12/01/15  Yes Zimmerman, Donielle M, PA-C  Cholecalciferol (VITAMIN D ) 50 MCG (2000 UT) CAPS Take 2,000 Units by mouth daily.   Yes [provider]  metoprolol  succinate (TOPROL -XL) 25 MG 24 hr tablet TAKE 1 TABLET BY MOUTH DAILY 08/03/23  Yes Debera Jayson MATSU, MD  olmesartan  (BENICAR ) 40 MG tablet Take 1 tablet (40 mg total) by mouth daily. 02/01/24 01/31/25 Yes Darlean Ozell NOVAK, MD  pantoprazole  (PROTONIX ) 40 MG tablet TAKE 1 TABLET BY MOUTH 2 TIMES A DAY BEFORE A MEAL 08/07/23  Yes Shirlean Therisa ORN, NP  nitroGLYCERIN  (NITROSTAT ) 0.4 MG SL tablet Place 1 tablet (0.4 mg total) under the tongue every 5 (five) minutes x 3 doses as needed for chest pain (if no relief after 2nd dose, proceed to ED or call 911). 10/06/21   Debera Jayson MATSU, MD  oxyCODONE -acetaminophen  (PERCOCET/ROXICET) 5-325 MG tablet Take 1 tablet by mouth every 4 (four) hours as needed. Patient not taking: Reported on 04/25/2024 08/23/23   Harden Jerona GAILS, MD    Allergies: Patient has no known allergies.    Review of Systems  Cardiovascular:  Positive for chest pain.  All other systems reviewed and are negative.   Updated Vital Signs BP (!) 132/99 (BP Location: Left Arm)    Pulse 60   Temp 98.2 F (36.8 C)   Resp 18   Ht 6' (1.829 m)   Wt 106.6 kg   SpO2 100%   BMI 31.87 kg/m   Physical Exam Vitals and nursing note reviewed.  Constitutional:      Appearance: He is well-developed.  HENT:     Head: Normocephalic and atraumatic.  Eyes:     Extraocular Movements: Extraocular movements intact.     Pupils: Pupils are equal, round, and reactive to light.  Cardiovascular:     Rate and Rhythm: Normal rate and regular rhythm.     Heart sounds: Normal heart sounds.  Pulmonary:     Effort: Pulmonary effort is normal.     Breath sounds: Normal breath sounds.  Abdominal:     General: Bowel sounds are normal.     Palpations: Abdomen is soft.  Musculoskeletal:        General: Normal range of motion.     Cervical back: Normal range of motion and neck supple.  Skin:    General: Skin is warm.     Capillary Refill: Capillary refill takes less than 2 seconds.  Neurological:     General: No focal deficit present.     Mental Status: He is alert and oriented to person, place, and  time.  Psychiatric:        Mood and Affect: Mood normal.        Behavior: Behavior normal.     (all labs ordered are listed, but only abnormal results are displayed) Labs Reviewed  COMPREHENSIVE METABOLIC PANEL WITH GFR - Abnormal; Notable for the following components:      Result Value   Sodium 134 (*)    CO2 20 (*)    Glucose, Bld 139 (*)    AST 55 (*)    ALT 51 (*)    Total Bilirubin 1.9 (*)    All other components within normal limits  TROPONIN I (HIGH SENSITIVITY) - Abnormal; Notable for the following components:   Troponin I (High Sensitivity) 2,449 (*)    All other components within normal limits  TROPONIN I (HIGH SENSITIVITY) - Abnormal; Notable for the following components:   Troponin I (High Sensitivity) 2,197 (*)    All other components within normal limits  CBC WITH DIFFERENTIAL/PLATELET  HEPARIN  LEVEL (UNFRACTIONATED)  HIV ANTIBODY (ROUTINE TESTING W REFLEX)     EKG: EKG Interpretation Date/Time:  Thursday April 25 2024 12:09:57 EST Ventricular Rate:  69 PR Interval:  168 QRS Duration:  88 QT Interval:  400 QTC Calculation: 428 R Axis:   -45  Text Interpretation: Normal sinus rhythm Left anterior fascicular block Abnormal ECG When compared with ECG of 24-Apr-2024 16:44, PREVIOUS ECG IS PRESENT No significant change since last tracing Confirmed by Dean Clarity [phone]) on 04/25/2024 1:54:24 PM  Radiology: [facility] Chest 2 View Result Date: 04/24/2024 CLINICAL DATA:  Chest pain EXAM: CHEST - 2 VIEW COMPARISON:  Chest x-ray 02/01/2024 FINDINGS: Sternotomy wires are again seen. The heart size and mediastinal contours are within normal limits. Both lungs are clear. The visualized skeletal structures are unremarkable. IMPRESSION: No active cardiopulmonary disease. Electronically Signed   By: Greig Pique M.D.   On: 04/24/2024 17:35     Procedures   Medications Ordered in the ED  heparin  ADULT infusion 100 units/mL (25000 units/250mL) (1,300 Units/hr Intravenous New Bag/Given 04/25/24 1438)  aspirin  EC tablet 81 mg (has no administration in time range)  metoprolol  succinate (TOPROL -XL) 24 hr tablet 25 mg (has no administration in time range)  irbesartan  (AVAPRO ) tablet 75 mg (has no administration in time range)  pantoprazole  (PROTONIX ) EC tablet 40 mg (has no administration in time range)  Vitamin D  CAPS 2,000 Units (has no administration in time range)  nitroGLYCERIN  (NITROSTAT ) SL tablet 0.4 mg (has no administration in time range)  acetaminophen  (TYLENOL ) tablet 650 mg (has no administration in time range)  ondansetron  (ZOFRAN ) injection 4 mg (has no administration in time range)  aspirin  EC tablet 325 mg (325 mg Oral Given 04/25/24 1433)  heparin  bolus via infusion 4,000 Units (4,000 Units Intravenous Bolus from Bag 04/25/24 1438)                                    Medical Decision Making Amount and/or Complexity of Data  Reviewed Labs: ordered.  Risk OTC drugs. Prescription drug management. Decision regarding hospitalization.   This patient presents to the ED for concern of cp, this involves an extensive number of treatment options, and is a complaint that carries with it a high risk of complications and morbidity.  The differential diagnosis includes cardiac, pulm, gi   Co morbidities that complicate the patient evaluation  CAD s/p CABG, GERD, HTN, CVA, and HLD  Additional history obtained:  Additional history obtained from epic chart review External records from outside source obtained and reviewed including wife   Lab Tests:  I Ordered, and personally interpreted labs.  The pertinent results include:  cbc nl, cmp nl; trop elevated at 2449   Imaging Studies ordered:  Pt refused CXR here.  He did have one last night which I reviewed and was normal.   Cardiac Monitoring:  The patient was maintained on a cardiac monitor.  I personally viewed and interpreted the cardiac monitored which showed an underlying rhythm of: nsr   Medicines ordered and prescription drug management:  I ordered medication including asa/heparin   for nstemi  Reevaluation of the patient after these medicines showed that the patient improved I have reviewed the patients home medicines and have made adjustments as needed  Critical Interventions:  heparin    Consultations Obtained:  I requested consultation with the cardiologist ,  and discussed lab and imaging findings as well as pertinent plan - they will admit  Problem List / ED Course:  NSTEMI:  pt started on heparin .  He's given asa.  Cards to admit.   Reevaluation:  After the interventions noted above, I reevaluated the patient and found that they have :improved   Social Determinants of Health:  Lives at home   Dispostion:  After consideration of the diagnostic results and the patients response to treatment, I feel that the patent would  benefit from admission.       Final diagnoses:  NSTEMI (non-ST elevated myocardial infarction) [facility])    ED Discharge Orders     None          Dean Clarity, MD 04/25/24 1530

## 2024-04-25 NOTE — ED Notes (Signed)
 VITALS REPEATED and pt moved to red zone so we can watch and monitor him

## 2024-04-25 NOTE — Telephone Encounter (Signed)
 Pt c/o of Chest Pain: STAT if active (IN THIS MOMENT) CP, including tightness, pressure, jaw pain, shoulder/upper arm/back pain, SOB, nausea, and vomiting.  1. Are you having CP right now (tightness, pressure, or discomfort)? tightness  2. Are you experiencing any other symptoms (ex. SOB, nausea, vomiting, sweating)? SOB  3. How long have you been experiencing CP? Yesterday around noon  4. Is your CP continuous or coming and going? continuous  5. Have you taken Nitroglycerin ? No   6. If CP returns before callback, please consider calling 911. ?   Call Transferred

## 2024-04-25 NOTE — ED Provider Triage Note (Addendum)
 Emergency Medicine Provider Triage Evaluation Note  Justin Villa , a 58 y.o. male  was evaluated in triage.  Pt complains of CP.  Seen at [facility] yesterday for same and left without being seen.  History of CABG 8 years ago.  Took nitro on arrival with some relief.  Review of Systems  Positive: Chest pain, SHOB w exertion Negative: Nausea, vomiting, fever, chills  Physical Exam  BP (!) 128/103 (BP Location: Right Arm)   Pulse (!) 58   Temp 98 F (36.7 C)   Resp 18   Ht 6' (1.829 m)   Wt 106.6 kg   SpO2 98%   BMI 31.87 kg/m  Gen:   Awake, no distress   Resp:  Normal effort  MSK:   Moves extremities without difficulty  Other:    Medical Decision Making  Medically screening exam initiated at 12:45 PM.  Appropriate orders placed.  Justin Villa was informed that the remainder of the evaluation will be completed by another provider, this initial triage assessment does not replace that evaluation, and the importance of remaining in the ED until their evaluation is complete.  Labs and imaging ordered   Justin Villa 04/25/24 1246    Justin Ileana LOISE, PA-C 04/25/24 1251

## 2024-04-25 NOTE — ED Notes (Addendum)
 Troponi 2.449 Kayla notified.

## 2024-04-25 NOTE — Progress Notes (Signed)
 PHARMACY - ANTICOAGULATION CONSULT NOTE  Pharmacy Consult for heparin   Indication: chest pain/ACS  No Known Allergies  Patient Measurements: Height: 6' (182.9 cm) Weight: 106.6 kg (235 lb 0.2 oz) IBW/kg (Calculated) : 77.6 HEPARIN  DW (KG): 99.9  Vital Signs: Temp: 98.2 F (36.8 C) (11/20 1346) BP: 132/99 (11/20 1346) Pulse Rate: 60 (11/20 1346)  Labs: Recent Labs    04/24/24 1712 04/25/24 1214  HGB 16.9 16.8  HCT 48.1 49.3  PLT 269 267  LABPROT 13.7  --   INR 1.0  --   CREATININE 1.18 1.17  TROPONINIHS  --  2,449*    Estimated Creatinine Clearance: 84.7 mL/min (by C-G formula based on SCr of 1.17 mg/dL).   Medical History: Past Medical History:  Diagnosis Date   CAD (coronary artery disease) 11/27/2015   a. S/p NSTEMI 6/17 >> s/p CABG // b. LHC 6/17: oLAD 50, mLAD 100, D1 70, D2 95, oLCx 95, mLCx 65, OM1 85, OM2 90, mRCA 85, dRCA 75, RPDA 40/50, RPLB2 50    Carotid stenosis    a. Carotid US  6/17: bilat ICA 1-39%   Essential hypertension    GERD (gastroesophageal reflux disease)    History of non-ST elevation myocardial infarction (NSTEMI) 11/2015   Hyperlipidemia    Stroke (HCC)     Medications:  (Not in a hospital admission)   Assessment: Patient is a 58 year old male admitted for chest pain/ACS. No history of blood thinners.   CBC WNL. No bleeding concerns.   Goal of Therapy:  Heparin  level 0.3-0.7 units/ml Monitor platelets by anticoagulation protocol: Yes   Plan:  Give 4000 units bolus x 1 Followed by 1300 units/hr.  HL in 6 hours Daily CBC   Molley Houser M Kimetha Trulson 04/25/2024,2:14 PM

## 2024-04-25 NOTE — Interval H&P Note (Signed)
 History and Physical Interval Note:  04/25/2024 4:21 PM  Justin Villa  has presented today for surgery, with the diagnosis of NSTEMI.  The various methods of treatment have been discussed with the patient and family. After consideration of risks, benefits and other options for treatment, the patient has consented to  Procedure(s): LEFT HEART CATH AND CORS/GRAFTS ANGIOGRAPHY (N/A) as a surgical intervention.  The patient's history has been reviewed, patient examined, no change in status, stable for surgery.  I have reviewed the patient's chart and labs.  Questions were answered to the patient's satisfaction.    Cath Lab Visit (complete for each Cath Lab visit)  Clinical Evaluation Leading to the Procedure:   ACS: Yes.    Non-ACS:  N/A  [name]

## 2024-04-25 NOTE — Telephone Encounter (Signed)
 Hadassah Siskin contacted and advised that patient would need to be contacted directly per last DPR in chart.  Contacted patient who reports active chest pressure rated 7/10, and SOB that he's had for the past 3 months that is unchanged. Reports chest pain started yesterday around 12:00 noon. Denies dizziness. Reports he has not used nitroglycerin  because he didn't have it available at the time.  Reports going to the ED yesterday and after waiting 8 hours, he left before evaluation and treatment plan was completed. Advised that he needed to go back to the ED now for a complete evaluation and treatment plan of his symptoms. Advised to go to [facility] ED now for an evaluation. Patient agreed and verbalized understanding of plan. Routed to DOD and PCP cardiology as an FYI.

## 2024-04-25 NOTE — ED Triage Notes (Signed)
 Patient was seen at [facility] yesterday for chest pains and left but had climbing trops had CABG 8 years ago .  Took his own nitroglycerin  right here at check in desk.

## 2024-04-26 ENCOUNTER — Inpatient Hospital Stay (HOSPITAL_COMMUNITY)

## 2024-04-26 ENCOUNTER — Ambulatory Visit: Payer: Self-pay | Admitting: Cardiology

## 2024-04-26 ENCOUNTER — Other Ambulatory Visit (HOSPITAL_COMMUNITY): Payer: Self-pay

## 2024-04-26 DIAGNOSIS — I214 Non-ST elevation (NSTEMI) myocardial infarction: Secondary | ICD-10-CM

## 2024-04-26 LAB — LIPID PANEL
Cholesterol: 165 mg/dL (ref 0–200)
HDL: 28 mg/dL — ABNORMAL LOW (ref >40)
LDL Cholesterol: 97 mg/dL (ref 0–99)
Total CHOL/HDL Ratio: 5.9 ratio
Triglycerides: 202 mg/dL — ABNORMAL HIGH (ref <150)
VLDL: 40 mg/dL (ref 0–40)

## 2024-04-26 LAB — ECHOCARDIOGRAM COMPLETE
Area-P 1/2: 2.92 cm2
Height: 72 in
S' Lateral: 2.2 cm
Weight: 3760.17 [oz_av]

## 2024-04-26 LAB — HEMOGLOBIN A1C
Hgb A1c MFr Bld: 5.1 % (ref 4.8–5.6)
Mean Plasma Glucose: 99.67 mg/dL

## 2024-04-26 LAB — BASIC METABOLIC PANEL WITH GFR
Anion gap: 14 (ref 5–15)
BUN: 14 mg/dL (ref 6–20)
CO2: 19 mmol/L — ABNORMAL LOW (ref 22–32)
Calcium: 8.8 mg/dL — ABNORMAL LOW (ref 8.9–10.3)
Chloride: 103 mmol/L (ref 98–111)
Creatinine, Ser: 1.13 mg/dL (ref 0.61–1.24)
GFR, Estimated: >60 mL/min (ref >60)
Glucose, Bld: 129 mg/dL — ABNORMAL HIGH (ref 70–99)
Potassium: 3.9 mmol/L (ref 3.5–5.1)
Sodium: 136 mmol/L (ref 135–145)

## 2024-04-26 LAB — CBC
HCT: 46.7 % (ref 39.0–52.0)
Hemoglobin: 16 g/dL (ref 13.0–17.0)
MCH: 30.7 pg (ref 26.0–34.0)
MCHC: 34.3 g/dL (ref 30.0–36.0)
MCV: 89.5 fL (ref 80.0–100.0)
Platelets: 224 K/uL (ref 150–400)
RBC: 5.22 MIL/uL (ref 4.22–5.81)
RDW: 14.8 % (ref 11.5–15.5)
WBC: 6.5 K/uL (ref 4.0–10.5)
nRBC: 0 % (ref 0.0–0.2)

## 2024-04-26 LAB — HIV ANTIBODY (ROUTINE TESTING W REFLEX): HIV Screen 4th Generation wRfx: NONREACTIVE

## 2024-04-26 MED ORDER — NITROGLYCERIN 0.4 MG SL SUBL
0.4000 mg | SUBLINGUAL_TABLET | SUBLINGUAL | 2 refills | Status: AC | PRN
  Filled 2024-04-26: qty 25, 7d supply, fill #0

## 2024-04-26 MED ORDER — ISOSORBIDE MONONITRATE ER 30 MG PO TB24
30.0000 mg | ORAL_TABLET | Freq: Every day | ORAL | 3 refills | Status: DC
  Filled 2024-04-26: qty 90, 90d supply, fill #0

## 2024-04-26 MED ORDER — CLOPIDOGREL BISULFATE 75 MG PO TABS
75.0000 mg | ORAL_TABLET | Freq: Every day | ORAL | 3 refills | Status: AC
  Filled 2024-04-26: qty 90, 90d supply, fill #0

## 2024-04-26 NOTE — Progress Notes (Signed)
 Rounding Note   Patient Name: Justin Villa Date of Encounter: 04/26/2024  [facility] Cardiologist: Jayson Sierras, MD   Subjective No CP; mild dyspnea  Scheduled Meds:  aspirin  EC  81 mg Oral Daily   cholecalciferol   2,000 Units Oral Daily   clopidogrel   75 mg Oral Q breakfast   enoxaparin  (LOVENOX ) injection  40 mg Subcutaneous Q24H   free water   500 mL Oral Once   irbesartan   75 mg Oral Daily   isosorbide  mononitrate  30 mg Oral Daily   metoprolol  succinate  25 mg Oral Daily   pantoprazole   40 mg Oral BID   sodium chloride  flush  3 mL Intravenous Q12H   Continuous Infusions:  sodium chloride      PRN Meds: sodium chloride , acetaminophen , nitroGLYCERIN , ondansetron  (ZOFRAN ) IV, sodium chloride  flush   Vital Signs  Vitals:   04/25/24 1907 04/25/24 2025 04/26/24 0115 04/26/24 0450  BP: (!) 142/84 (!) 138/93 97/65 121/73  Pulse: 63 70 64 84  Resp: 18 17 20 15   Temp: 98 F (36.7 C) 98 F (36.7 C) 98.1 F (36.7 C) 98.3 F (36.8 C)  TempSrc: Oral Oral Oral Oral  SpO2: 98% 98% 97% 98%  Weight:      Height: 6' (1.829 m)       Intake/Output Summary (Last 24 hours) at 04/26/2024 0755 Last data filed at 04/25/2024 2120 Gross per 24 hour  Intake 6 ml  Output --  Net 6 ml      04/25/2024   12:04 PM 04/24/2024    4:43 PM 03/14/2024   11:13 AM  Last 3 Weights  Weight (lbs) 235 lb 0.2 oz 235 lb 243 lb  Weight (kg) 106.6 kg 106.595 kg 110.224 kg      Telemetry Sinus - Personally Reviewed   Physical Exam  GEN: No acute distress.   Neck: No JVD Cardiac: RRR, no murmurs, rubs, or gallops.  Respiratory: Clear to auscultation bilaterally. GI: Soft, nontender, non-distended  MS: No edema; radial cath site with no hematoma. Neuro:  Nonfocal  Psych: Normal affect   Labs High Sensitivity Troponin:   Recent Labs  Lab 04/25/24 1214 04/25/24 1358  TROPONINIHS 2,449* 2,197*     Chemistry Recent Labs  Lab 04/24/24 1712 04/25/24 1214  04/25/24 2022 04/26/24 0430  NA 138 134*  --  136  K 4.0 3.8  --  3.9  CL 103 102  --  103  CO2 21* 20*  --  19*  GLUCOSE 162* 139*  --  129*  BUN 16 17  --  14  CREATININE 1.18 1.17 1.28* 1.13  CALCIUM  9.8 8.9  --  8.8*  PROT  --  6.9  --   --   ALBUMIN   --  4.2  --   --   AST  --  55*  --   --   ALT  --  51*  --   --   ALKPHOS  --  49  --   --   BILITOT  --  1.9*  --   --   GFRNONAA >60 >60 >60 >60  ANIONGAP 14 12  --  14    Lipids  Recent Labs  Lab 04/26/24 0430  CHOL 165  TRIG 202*  HDL 28*  LDLCALC 97  CHOLHDL 5.9    Hematology Recent Labs  Lab 04/25/24 1214 04/25/24 2022 04/26/24 0430  WBC 6.9 6.7 6.5  RBC 5.45 5.15 5.22  HGB 16.8 15.7 16.0  HCT  49.3 46.1 46.7  MCV 90.5 89.5 89.5  MCH 30.8 30.5 30.7  MCHC 34.1 34.1 34.3  RDW 15.0 14.9 14.8  PLT 267 208 224    Radiology  CARDIAC CATHETERIZATION Result Date: 04/25/2024   1st Diag lesion is 99% stenosed.   Mid LAD-1 lesion is 75% stenosed with 75% stenosed side branch in 2nd Diag.   Mid LAD-2 lesion is 100% stenosed.   Origin to Insertion lesion is 100% stenosed.   Ramus lesion is 90% stenosed.   Ost Cx to Dist Cx lesion is 100% stenosed.   Prox RCA to Dist RCA lesion is 100% stenosed.   Prox Graft lesion is 20% stenosed.   RPDA-2 lesion is 50% stenosed.   RPDA-1 lesion is 30% stenosed.   RPAV lesion is 70% stenosed.   2nd RPL lesion is 80% stenosed.   LIMA graft was visualized by angiography and is normal in caliber.   SVG graft was visualized by angiography.   Seq SVG- OM1 and OM2 graft was visualized by angiography and is large and anatomically normal.   SVG graft was visualized by angiography and is normal in caliber.   The left ventricular systolic function is normal.   LV end diastolic pressure is normal.   The left ventricular ejection fraction is 55-65% by visual estimate.   There is no aortic valve stenosis.   In the absence of any other complications or medical issues, we expect the patient to be ready  for discharge from a cath perspective on 04/26/2024.   Recommend uninterrupted dual antiplatelet therapy with Aspirin  81mg  daily and Clopidogrel  75mg  daily for a minimum of 12 months (ACS-Class I recommendation). Conclusions: Severe native coronary artery disease, as detailed below, including chronic total occlusions of mid LAD, ostial LCx, and proximal RCA, as well as significant small-vessel disease. Widely patent LIMA-LAD, SVG-OM1-OM2, and SVG-rPDA. Occluded SVG-diagonal. Normal left ventricular systolic function (LVEF 55-65%)  and filling pressure (LVEDP 9 mmHg). Recommendations: Medical therapy and aggressive secondary prevention.  No target for PCI. Will load with clopidogrel  300 mg with plans for dual antiplatelet therapy for up to 12 months. [name], MD [facility]  DG Chest 2 View Result Date: 04/24/2024 CLINICAL DATA:  Chest pain EXAM: CHEST - 2 VIEW COMPARISON:  Chest x-ray 02/01/2024 FINDINGS: Sternotomy wires are again seen. The heart size and mediastinal contours are within normal limits. Both lungs are clear. The visualized skeletal structures are unremarkable. IMPRESSION: No active cardiopulmonary disease. Electronically Signed   By: Greig Pique M.D.   On: 04/24/2024 17:35    Patient Profile   58 year old male with past medical history of coronary artery disease status post coronary bypass and graft, hypertension, hyperlipidemia, prior CVA with non-ST elevation myocardial infarction. Patient is status post coronary artery bypass and graft in 2017 with LIMA to the LAD, saphenous vein graft to the diagonal, saphenous vein graft to OM1/OM 2 and saphenous vein graft to the PDA. Cardiac catheterization revealed severe native coronary artery disease with total occlusions of the mid LAD, circumflex and RCA.  Patent LIMA to the LAD, saphenous vein graft to OM1 and OM 2 and saphenous vein graft to PDA; occluded saphenous vein graft to the diagonal, normal LV function and left ventricular  filling pressure.  Assessment & Plan  1 non-ST elevation myocardial infarction-cardiac catheterization results noted.  Plan is medical therapy.  Continue aspirin , Plavix , isosorbide  and beta-blocker.  He is intolerant to statins.     2 hyperlipidemia-LDL is 97.  He is  intolerant to statins.  Referred to lipid clinic for PCSK9 inhibitor.   3 hypertension-continue present medications.  Plan discharge later today.  Patient would like to attend funeral at 2 PM.  We can arrange follow-up echocardiogram as an outpatient.  Note LV function normal on cardiac catheterization.  Plan follow-up with APP 2 weeks.  Follow-up with Dr. Debera in 3 months.  Greater than 30 minutes physician time.    For questions or updates, please contact [facility] Please consult [email or web address] for contact info under   Signed, Redell Shallow, MD  04/26/2024, 7:55 AM

## 2024-04-26 NOTE — Progress Notes (Signed)
  Echocardiogram 2D Echocardiogram has been performed.  Charmaine KANDICE Gaskins 04/26/2024, 9:46 AM

## 2024-04-26 NOTE — Discharge Summary (Addendum)
 Discharge Summary   Patient ID: Justin Villa MRN: [id number]; DOB: Nov 04, 1965  Admit date: 04/25/2024 Discharge date: 04/26/2024  PCP:  Glennon Sand, NP (Inactive)   [facility] Providers Cardiologist:  Will re-establish with new physician in [place] office  Discharge Diagnoses  Principal Problem:   NSTEMI (non-ST elevated myocardial infarction) [facility]) Active Problems:   Essential hypertension   HLD (hyperlipidemia)   GERD (gastroesophageal reflux disease)   Prediabetes  Diagnostic Studies/Procedures   Cardiac catheterization, 04/25/2024 performed by Dr. Mady   1st Diag lesion is 99% stenosed.   Mid LAD-1 lesion is 75% stenosed with 75% stenosed side branch in 2nd Diag.   Mid LAD-2 lesion is 100% stenosed.   Origin to Insertion lesion is 100% stenosed.   Ramus lesion is 90% stenosed.   Ost Cx to Dist Cx lesion is 100% stenosed.   Prox RCA to Dist RCA lesion is 100% stenosed.   Prox Graft lesion is 20% stenosed.   RPDA-2 lesion is 50% stenosed.   RPDA-1 lesion is 30% stenosed.   RPAV lesion is 70% stenosed.   2nd RPL lesion is 80% stenosed.   LIMA graft was visualized by angiography and is normal in caliber.   SVG graft was visualized by angiography.   Seq SVG- OM1 and OM2 graft was visualized by angiography and is large and anatomically normal.   SVG graft was visualized by angiography and is normal in caliber.   The left ventricular systolic function is normal.   LV end diastolic pressure is normal.   The left ventricular ejection fraction is 55-65% by visual estimate.   There is no aortic valve stenosis.   In the absence of any other complications or medical issues, we expect the patient to be ready for discharge from a cath perspective on 04/26/2024.   Recommend uninterrupted dual antiplatelet therapy with Aspirin  81mg  daily and Clopidogrel  75mg  daily for a minimum of 12 months (ACS-Class I recommendation).   Conclusions: Severe native coronary artery  disease, as detailed below, including chronic total occlusions of mid LAD, ostial LCx, and proximal RCA, as well as significant small-vessel disease. Widely patent LIMA-LAD, SVG-OM1-OM2, and SVG-rPDA. Occluded SVG-diagonal. Normal left ventricular systolic function (LVEF 55-65%)  and filling pressure (LVEDP 9 mmHg).   Recommendations: Medical therapy and aggressive secondary prevention.  No target for PCI. Will load with clopidogrel  300 mg with plans for dual antiplatelet therapy for up to 12 months.  Echocardiogram, 04/26/2024 Performed while inpatient, results pending at time of discharge _____________   History of Present Illness   Justin Villa is a 58 y.o. male with CAD s/p CABG (LIMA-LAD, SVG-diagonal, SVG to OM1/OM2, SVG-PDA), HTN, GERD, HLD, history of CVA who presented to [facility] ED with chest pain.   Hospital Course   Patient presented to [facility] ED with chest pain on 04/25/2024. His troponin was elevated in 20s then 2000s with a story concerning for an NSTEMI. He was admitted to the caridology service and underwent a cardiac cath.  Full report as above, but LHC showed severe native coronary disease, CTO of mLAD, ostial LCx, pRCA along with significant small vessel disease.  He was noted to have a widely patent LIMA-LAD, SVG-OM1-OM2, and SVG-rPDA with occluded SVG-diagonal.  The recommendation was for medical therapy and aggressive secondary prevention as there was no targets for PCI.  Patient was loaded with 300 mg of Plavix  with plans for DAPT with aspirin  and Plavix  for at least 12 months.  He was seen  by Dr. Pietro morning of 04/26/2024 and deemed medically stable for discharge.  His treatment regimen is as below:  CAD s/p CABG NSTEMI Continue DAPT with aspirin  81 mg daily and Plavix  75 mg daily x 12 months Continue Imdur  30 mg daily Continue Toprol  25 mg daily Restart olmesartan  at discharge Continue with as needed sublingual  nitroglycerin   Hyperlipidemia 04/25/2024: ALT 51 04/26/2024: HDL 28; LDL Cholesterol 97  Patient is intolerant to statins Will refer to lipid clinic for consideration of PCSK9 inhibitor  Hypertension Continue olmesartan  40 mg daily Continue Toprol  25 mg daily  Patient was previously followed at a reasonable office however him and his wife would like to transition to care at the office.  She tells me that they actually live in [place], the patient's business is just in [place] which is why is listed as his address.  I will make follow-up with APP in [place] office in the next 2 to 3 weeks at that appointment they can discuss what new cardiologist he would like to establish with.     Did the patient have an acute coronary syndrome (MI, NSTEMI, STEMI, etc) this admission?:  Yes                               AHA/ACC ACS Clinical Performance & Quality Measures: Aspirin  prescribed? - Yes ADP Receptor Inhibitor (Plavix /Clopidogrel , Brilinta/Ticagrelor or Effient/Prasugrel) prescribed (includes medically managed patients)? - Yes Beta Blocker prescribed? - Yes High Intensity Statin (Lipitor  40-80mg  or Crestor 20-40mg ) prescribed? - No - statin intolerant, referring to lipid clinic EF assessed during THIS hospitalization? - Yes For EF <40%, was ACEI/ARB prescribed? - Yes For EF <40%, Aldosterone Antagonist (Spironolactone or Eplerenone) prescribed? - Not Applicable (EF >/= 40%) Cardiac Rehab Phase II ordered (including medically managed patients)? - Yes    _____________  Discharge Vitals Blood pressure 120/76, pulse 76, temperature 97.8 F (36.6 C), temperature source Oral, resp. rate 18, height 6' (1.829 m), weight 106.6 kg, SpO2 97%.  Filed Weights   04/25/24 1204  Weight: 106.6 kg    Labs & Radiologic Studies  CBC Recent Labs    04/25/24 1214 04/25/24 2022 04/26/24 0430  WBC 6.9 6.7 6.5  NEUTROABS 4.2  --   --   HGB 16.8 15.7 16.0  HCT 49.3 46.1 46.7  MCV 90.5 89.5  89.5  PLT 267 208 224   Basic Metabolic Panel Recent Labs    88/79/74 1214 04/25/24 2022 04/26/24 0430  NA 134*  --  136  K 3.8  --  3.9  CL 102  --  103  CO2 20*  --  19*  GLUCOSE 139*  --  129*  BUN 17  --  14  CREATININE 1.17 1.28* 1.13  CALCIUM  8.9  --  8.8*   Liver Function Tests Recent Labs    04/25/24 1214  AST 55*  ALT 51*  ALKPHOS 49  BILITOT 1.9*  PROT 6.9  ALBUMIN  4.2   No results for input(s): LIPASE, AMYLASE in the last 72 hours. High Sensitivity Troponin:   Recent Labs  Lab 04/25/24 1214 04/25/24 1358  TROPONINIHS 2,449* 2,197*    Recent Labs  Lab 04/24/24 1712 04/24/24 1951  TRNPT 20* 27*    BNP Invalid input(s): POCBNP No results for input(s): PROBNP in the last 72 hours.  No results for input(s): BNP in the last 72 hours.  D-Dimer No results for input(s): DDIMER in the last 72  hours. Hemoglobin A1C Recent Labs    04/26/24 0430  HGBA1C 5.1   Fasting Lipid Panel Recent Labs    04/26/24 0430  CHOL 165  HDL 28*  LDLCALC 97  TRIG 797*  CHOLHDL 5.9   No results found for: LIPOA  Thyroid  Function Tests No results for input(s): TSH, T4TOTAL, T3FREE, THYROIDAB in the last 72 hours.  Invalid input(s): FREET3 _____________  CARDIAC CATHETERIZATION Result Date: 04/25/2024   1st Diag lesion is 99% stenosed.   Mid LAD-1 lesion is 75% stenosed with 75% stenosed side branch in 2nd Diag.   Mid LAD-2 lesion is 100% stenosed.   Origin to Insertion lesion is 100% stenosed.   Ramus lesion is 90% stenosed.   Ost Cx to Dist Cx lesion is 100% stenosed.   Prox RCA to Dist RCA lesion is 100% stenosed.   Prox Graft lesion is 20% stenosed.   RPDA-2 lesion is 50% stenosed.   RPDA-1 lesion is 30% stenosed.   RPAV lesion is 70% stenosed.   2nd RPL lesion is 80% stenosed.   LIMA graft was visualized by angiography and is normal in caliber.   SVG graft was visualized by angiography.   Seq SVG- OM1 and OM2 graft was visualized by  angiography and is large and anatomically normal.   SVG graft was visualized by angiography and is normal in caliber.   The left ventricular systolic function is normal.   LV end diastolic pressure is normal.   The left ventricular ejection fraction is 55-65% by visual estimate.   There is no aortic valve stenosis.   In the absence of any other complications or medical issues, we expect the patient to be ready for discharge from a cath perspective on 04/26/2024.   Recommend uninterrupted dual antiplatelet therapy with Aspirin  81mg  daily and Clopidogrel  75mg  daily for a minimum of 12 months (ACS-Class I recommendation). Conclusions: Severe native coronary artery disease, as detailed below, including chronic total occlusions of mid LAD, ostial LCx, and proximal RCA, as well as significant small-vessel disease. Widely patent LIMA-LAD, SVG-OM1-OM2, and SVG-rPDA. Occluded SVG-diagonal. Normal left ventricular systolic function (LVEF 55-65%)  and filling pressure (LVEDP 9 mmHg). Recommendations: Medical therapy and aggressive secondary prevention.  No target for PCI. Will load with clopidogrel  300 mg with plans for dual antiplatelet therapy for up to 12 months. [name], MD [facility]  DG Chest 2 View Result Date: 04/24/2024 CLINICAL DATA:  Chest pain EXAM: CHEST - 2 VIEW COMPARISON:  Chest x-ray 02/01/2024 FINDINGS: Sternotomy wires are again seen. The heart size and mediastinal contours are within normal limits. Both lungs are clear. The visualized skeletal structures are unremarkable. IMPRESSION: No active cardiopulmonary disease. Electronically Signed   By: Greig Pique M.D.   On: 04/24/2024 17:35   Disposition Pt is being discharged home today in good condition per MD.  Follow-up Plans & Appointments  Future Appointments  Date Time Provider Department Center  05/14/2024  2:45 PM Lelon Glendia [facility]  07/10/2024  8:15 AM Addie Cordella Glendia, MD [facility] None   Transition care for  [place] office to [facility] office.  Patient will discuss which cardiologist at the [facility] office would like to establish care at his upcoming appointment on May 14, 2024  Discharge Instructions     AMB Referral to Advanced Lipid Disorders Clinic   Complete by: As directed    Internal Lipid Clinic Referral Scheduling  Internal lipid clinic referrals are providers within [facility], who wish to refer established  patients for routine management (help in starting PCSK9 inhibitor therapy) or advanced therapies.  Internal MD referral criteria:              1. All patients with LDL>190 mg/dL  2. All patients with Triglycerides >500 mg/dL  3. Patients with suspected or confirmed heterozygous familial hyperlipidemia (HeFH) or homozygous familial hyperlipidemia (HoFH)  4. Patients with family history of suspicious for genetic dyslipidemia desiring genetic testing  5. Patients refractory to standard guideline based therapy  6. Patients with statin intolerance (failed 2 statins, one of which must be a high potency statin)  7. Patients who the provider desires to be seen by MD   Internal PharmD referral criteria:   1. Follow-up patients for medication management  2. Follow-up for compliance monitoring  3. Patients for drug education  4. Patients with statin intolerance  5. PCSK9 inhibitor education and prior authorization approvals  6. Patients with triglycerides <500 mg/dL  External Lipid Clinic Referral  External lipid clinic referrals are for providers outside of [facility], considered new clinic patients - automatically routed to MD schedule   AMB referral to Phase II Cardiac Rehabilitation   Complete by: As directed    Diagnosis: NSTEMI   After initial evaluation and assessments completed: Virtual Based Care may be provided alone or in conjunction with Phase 2 Cardiac Rehab based on patient barriers.: Yes   Intensive Cardiac Rehabilitation (ICR) [facility] location only OR Traditional  Cardiac Rehabilitation (TCR) *If criteria for ICR are not met will enroll in TCR [facility] only): Yes   Call MD for:  redness, tenderness, or signs of infection (pain, swelling, redness, odor or green/yellow discharge around incision site)   Complete by: As directed    Discharge instructions   Complete by: As directed    Radial Site Care Refer to this sheet in the next few weeks. These instructions provide you with information on caring for yourself after your procedure. Your caregiver may also give you more specific instructions. Your treatment has been planned according to current medical practices, but problems sometimes occur. Call your caregiver if you have any problems or questions after your procedure.  HOME CARE INSTRUCTIONS You may shower the day after the procedure. Remove the bandage (dressing) and gently wash the site with plain soap and water . Gently pat the site dry.  Do not apply powder or lotion to the site.  Do not submerge the affected site in water  for 3 to 5 days.  Inspect the site at least twice daily.  Do not flex or bend the affected arm for 24 hours.  No lifting over 5 pounds (2.3 kg) for 5 days after your procedure.  Do not drive home if you are discharged the same day of the procedure. Have someone else drive you.  You may drive 24 hours after the procedure unless otherwise instructed by your caregiver.   What to expect: Any bruising will usually fade within 1 to 2 weeks.  Blood that collects in the tissue (hematoma) may be painful to the touch. It should usually decrease in size and tenderness within 1 to 2 weeks.   SEEK IMMEDIATE MEDICAL CARE IF: You have unusual pain at the radial site.  You have redness, warmth, swelling, or pain at the radial site.  You have drainage (other than a small amount of blood on the dressing).  You have chills.  You have a fever or persistent symptoms for more than 72 hours.  You have a fever and your  symptoms suddenly get worse.   Your arm becomes pale, cool, tingly, or numb.  You have heavy bleeding from the site. Hold pressure on the site.   PLEASE DO NOT MISS ANY DOSES OF YOUR PLAVIX !!!!! Also keep a log of you blood pressures and bring back to your follow up appt. Please call the office with any questions.   Patients taking blood thinners should generally stay away from medicines like ibuprofen , Advil , Motrin , naproxen, and Aleve due to risk of stomach bleeding. You may take Tylenol  as directed or talk to your primary doctor about alternatives.  Some studies suggest Prilosec/Omeprazole interacts with Plavix . We changed your Prilosec/Omeprazole to the equivalent dose of Protonix  for less chance of interaction.  PLEASE ENSURE THAT YOU DO NOT RUN OUT OF YOUR PLAVIX . This medication is very important to remain on for at least one year. IF you have issues obtaining this medication due to cost please CALL the office 3-5 business days prior to running out in order to prevent missing doses of this medication.      Discharge Medications Allergies as of 04/26/2024   No Known Allergies      Medication List     STOP taking these medications    oxyCODONE -acetaminophen  5-325 MG tablet Commonly known as: PERCOCET/ROXICET       TAKE these medications    aspirin  EC 81 MG tablet Take 1 tablet (81 mg total) by mouth daily.   clopidogrel  75 MG tablet Commonly known as: PLAVIX  Take 1 tablet (75 mg total) by mouth daily with breakfast. Start taking on: April 27, 2024   isosorbide  mononitrate 30 MG 24 hr tablet Commonly known as: IMDUR  Take 1 tablet (30 mg total) by mouth daily. Start taking on: April 27, 2024   metoprolol  succinate 25 MG 24 hr tablet Commonly known as: TOPROL -XL TAKE 1 TABLET BY MOUTH DAILY   nitroGLYCERIN  0.4 MG SL tablet Commonly known as: NITROSTAT  Place 1 tablet (0.4 mg total) under the tongue every 5 (five) minutes x 3 doses as needed for chest pain. What changed: reasons to take  this   olmesartan  40 MG tablet Commonly known as: BENICAR  Take 1 tablet (40 mg total) by mouth daily.   pantoprazole  40 MG tablet Commonly known as: PROTONIX  TAKE 1 TABLET BY MOUTH 2 TIMES A DAY BEFORE A MEAL   Vitamin D  50 MCG (2000 UT) Caps Take 2,000 Units by mouth daily.          Duration of Discharge Encounter: APP Time: 25 minutes   Signed, Waddell DELENA Donath, PA-C 04/26/2024, 10:45 AM

## 2024-04-26 NOTE — TOC CM/SW Note (Signed)
 Transition of Care [facility]) - Inpatient Brief Assessment   Patient Details  Name: Justin Villa MRN: [id number] Date of Birth: 1965/10/20  [facility] of [facility] CM/SW Contact:    Sudie Erminio Deems, RN Phone Number: 04/26/2024, 11:03 AM   Clinical Narrative: Patient presented for chest pain-Nstemi. Plan for home on Plavix . PTA patient was from home with friend support. Patient has PCP and has transportation to appointments. No home needs identified at this time.    Transition of Care Asessment: Insurance and Status: Insurance coverage has been reviewed Patient has primary care physician: Yes Home environment has been reviewed: reviewed Prior level of function:: independent Prior/Current Home Services: No current home services Social Drivers of Health Review: SDOH reviewed no interventions necessary Readmission risk has been reviewed: Yes Transition of care needs: no transition of care needs at this time

## 2024-04-27 LAB — LIPOPROTEIN A (LPA): Lipoprotein (a): 10.6 nmol/L (ref <75.0)

## 2024-04-29 NOTE — Telephone Encounter (Signed)
 Letter of results sent to pt

## 2024-05-01 ENCOUNTER — Telehealth (HOSPITAL_COMMUNITY): Payer: Self-pay

## 2024-05-01 NOTE — Telephone Encounter (Signed)
Pt is not interested in the cardiac rehab program. Closed referral 

## 2024-05-14 ENCOUNTER — Ambulatory Visit: Attending: Physician Assistant

## 2024-05-14 ENCOUNTER — Encounter: Payer: Self-pay | Admitting: Physician Assistant

## 2024-05-14 VITALS — BP 122/70 | HR 69 | Ht 72.0 in | Wt 247.8 lb

## 2024-05-14 DIAGNOSIS — I25810 Atherosclerosis of coronary artery bypass graft(s) without angina pectoris: Secondary | ICD-10-CM

## 2024-05-14 DIAGNOSIS — I10 Essential (primary) hypertension: Secondary | ICD-10-CM

## 2024-05-14 DIAGNOSIS — E782 Mixed hyperlipidemia: Secondary | ICD-10-CM

## 2024-05-14 MED ORDER — ISOSORBIDE MONONITRATE ER 60 MG PO TB24: 60.0000 mg | ORAL_TABLET | Freq: Every day | ORAL | 3 refills | Status: AC

## 2024-05-14 NOTE — Patient Instructions (Addendum)
 Medication Instructions:  INCREASE IMDUR  TO 60 MG DAILY *If you need a refill on your cardiac medications before your next appointment, please call your pharmacy*  Lab Work: NO LABS If you have labs (blood work) drawn today and your tests are completely normal, you will receive your results only by: MyChart Message (if you have MyChart) OR A paper copy in the mail If you have any lab test that is abnormal or we need to change your treatment, we will call you to review the results.  Testing/Procedures: NO TESTING  Follow-Up: At [facility] you and your health needs are our priority.  As part of our continuing mission to provide you with exceptional heart care, our providers are all part of one team.  This team includes your primary Cardiologist (physician) and Advanced Practice Providers or APPs (Physician Assistants and Nurse Practitioners) who all work together to provide you with the care you need, when you need it.  Your next appointment:   4-6 week(s)  Provider:   APP    Other Instructions BE SURE TO CHECK BLOOD PRESSURE DAILY

## 2024-05-14 NOTE — Progress Notes (Signed)
 Cardiology Office Note   Date:  05/14/2024  ID:  Justin Villa, DOB 1965-09-11, MRN [id number] PCP: Justin Sand, NP (Inactive)  [facility] Providers Cardiologist:  None     History of Present Illness Justin Villa is a 58 y.o. male CAD s/p CABG x4 (LIMA-LAD, SVG-diag, SVG-OM1-OM2, and SVG-PDA), CVA, HTN, HLD, Prediabetes, and GERD.     He had CABG in 2017 with LIMA to LAD, SVG to diagonal, SVG to OM1/OM2, and SVG to PDA. Post surgery recovery was unremarkable. Found to have had an old stroke on head CT 06/2018. Stress test 11/2019 was a normal, low risk study. 06/2022 having myalgias on Lipitor  40 mg, cut back to 20 mg. He was feeling well in his last office visit 03/05/24 and then in the last couple of months started having some fatigue and DOE. Hospitalized 04/25/24 as NSTEMI at [facility] and left AMA. His troponin peaked at 2449. He then called the office with progressive symptoms and was told to go to ED for further workup. He underwent cardiac catheterization that showed severe native CAD, including CTO of mid LAD, ostial Lcx, and proximal RCA. Widely patent LIMA-LAD, SVG-OM1-OM2, and SVG-rPDA. Occulded SVG- diagonal. Recommended medical therapy and aggressive secondary prevention. No target for PCI. He was loaded with clopidogrel  300 mg with plans for dual antiplatlet therapy for up to a year. He was noted to be intolerant to statins.    He presents today for hospital follow up s/p NSTEMI. He notes that he has been feeling more fatigue than usual. He also gets short of breath when walking a short distance from his car to his house. He has had pre syncopal episodes, lightheadedness, and dizziness once a week. He does state that the medications seem to be helping some but he thinks his SOB has continued to stay the same since before his recent heart attack. He was curious about having a carotid ultrasound d/t family history. Reassured him this was not indicated at this time. He has  been contacted by lipid clinic for appointment regarding his statin intolerance. He denies chest pain, hematuria, and hematochezia.   ROS: All systems are negative unless otherwise indicated in HPI.   Studies Reviewed     Cardiac Studies & Procedures   ______________________________________________________________________________________________ CARDIAC CATHETERIZATION  CARDIAC CATHETERIZATION 04/25/2024  Conclusion   1st Diag lesion is 99% stenosed.   Mid LAD-1 lesion is 75% stenosed with 75% stenosed side branch in 2nd Diag.   Mid LAD-2 lesion is 100% stenosed.   Origin to Insertion lesion is 100% stenosed.   Ramus lesion is 90% stenosed.   Ost Cx to Dist Cx lesion is 100% stenosed.   Prox RCA to Dist RCA lesion is 100% stenosed.   Prox Graft lesion is 20% stenosed.   RPDA-2 lesion is 50% stenosed.   RPDA-1 lesion is 30% stenosed.   RPAV lesion is 70% stenosed.   2nd RPL lesion is 80% stenosed.   LIMA graft was visualized by angiography and is normal in caliber.   SVG graft was visualized by angiography.   Seq SVG- OM1 and OM2 graft was visualized by angiography and is large and anatomically normal.   SVG graft was visualized by angiography and is normal in caliber.   The left ventricular systolic function is normal.   LV end diastolic pressure is normal.   The left ventricular ejection fraction is 55-65% by visual estimate.   There is no aortic valve stenosis.   In the absence  of any other complications or medical issues, we expect the patient to be ready for discharge from a cath perspective on 04/26/2024.   Recommend uninterrupted dual antiplatelet therapy with Aspirin  81mg  daily and Clopidogrel  75mg  daily for a minimum of 12 months (ACS-Class I recommendation).  Conclusions: Severe native coronary artery disease, as detailed below, including chronic total occlusions of mid LAD, ostial LCx, and proximal RCA, as well as significant small-vessel disease. Widely patent  LIMA-LAD, SVG-OM1-OM2, and SVG-rPDA. Occluded SVG-diagonal. Normal left ventricular systolic function (LVEF 55-65%)  and filling pressure (LVEDP 9 mmHg).  Recommendations: Medical therapy and aggressive secondary prevention.  No target for PCI. Will load with clopidogrel  300 mg with plans for dual antiplatelet therapy for up to 12 months.  [name], MD [facility]  Findings Coronary Findings Diagnostic  Dominance: Right  Left Main Vessel is large. There is mild diffuse disease throughout the vessel.  Left Anterior Descending Vessel is moderate in size. There is moderate diffuse disease throughout the vessel. Mid LAD-1 lesion is 75% stenosed with 75% stenosed side branch in 2nd Diag. Mid LAD-2 lesion is 100% stenosed.  First Diagonal Branch Vessel is small in size. 1st Diag lesion is 99% stenosed. TIMI-1 flow  Second Diagonal Branch Vessel is moderate in size.  Ramus Intermedius Vessel is small. Ramus lesion is 90% stenosed.  Left Circumflex Vessel is moderate in size. Ost Cx to Dist Cx lesion is 100% stenosed. The lesion is chronically occluded.  First Obtuse Marginal Branch Vessel is moderate in size. There is moderate disease in the vessel.  Second Obtuse Marginal Branch Vessel is moderate in size.  Right Coronary Artery Vessel is moderate in size. Prox RCA to Dist RCA lesion is 100% stenosed. The lesion is chronically occluded.  Right Posterior Descending Artery RPDA-1 lesion is 30% stenosed. RPDA-2 lesion is 50% stenosed.  Right Posterior Atrioventricular Artery Vessel is moderate in size. RPAV lesion is 70% stenosed.  First Right Posterolateral Branch Vessel is small in size.  Second Right Posterolateral Branch Vessel is moderate in size. 2nd RPL lesion is 80% stenosed.  LIMA LIMA Graft To Dist LAD LIMA graft was visualized by angiography and is normal in caliber.  Saphenous Graft To 1st Diag SVG graft was visualized by  angiography. Origin to Insertion lesion is 100% stenosed.  Sequential Jump Graft Graft To 1st Mrg, 2nd Mrg Seq SVG- OM1 and OM2 graft was visualized by angiography and is large and anatomically normal.  Saphenous Graft To RPDA SVG graft was visualized by angiography and is normal in caliber. Prox Graft lesion is 20% stenosed.  Intervention  No interventions have been documented.   CARDIAC CATHETERIZATION  CARDIAC CATHETERIZATION 11/26/2015  Conclusion 1. Mid RCA lesion, 85% stenosed. 2. 1st Mrg lesion, 85% stenosed. 3. Ost Cx to Mid Cx lesion, 95% stenosed. 4. Ost 2nd Mrg to 2nd Mrg lesion, 90% stenosed. 5. Mid Cx to Dist Cx lesion, 65% stenosed. 6. Ost 1st Diag to 1st Diag lesion, 70% stenosed. 7. Ost 2nd Diag to 2nd Diag lesion, 95% stenosed. 8. Mid LAD to Dist LAD lesion, 100% stenosed. 9. Ost LAD to Mid LAD lesion, 50% stenosed. 10. Dist RCA lesion, 75% stenosed. 11. RPDA-1 lesion, 40% stenosed. 12. RPDA-2 lesion, 50% stenosed. 13. 2nd RPLB lesion, 50% stenosed.   Severe three-vessel coronary disease as outlined above with totally occluded mid LAD high-grade obstruction in large diagonal branches, high-grade obstruction in the proximal circumflex in each obtuse marginal branch, and high-grade obstruction in the mid and distal  RCA. The RCA supplies the LAD via septal perforator collaterals.  Normal left ventricular systolic function. Normal left ventricular filling pressures.   RECOMMENDATIONS:   Cardiovascular surgical evaluation for multivessel bypass surgery.  Resume heparin .  Remain hospitalized until surgery can be performed.  Findings Coronary Findings Diagnostic  Dominance: Right  Left Anterior Descending  First Diagonal Branch The vessel is moderate in size. The lesion is type C.  Second Diagonal Branch The vessel is large in size.  Ramus Intermedius . Vessel is small.  Left Circumflex Tubular.  First Obtuse Marginal Branch The vessel is  small in size.  Second Obtuse Marginal Branch  Right Coronary Artery The lesion is type C. The lesion is type C.  Right Posterior Descending Artery  First Right Posterolateral Branch The vessel is small in size.  Second Right Posterolateral Branch  Intervention  No interventions have been documented.   STRESS TESTS  NM MYOCAR MULTI W/SPECT W 03/07/2023  Narrative   Findings are consistent with no ischemia. The study is low risk.   No ST deviation was noted. The ECG was negative for ischemia.   LV perfusion is normal.  Small, mild intensity, fixed apical defect most consistent with soft tissue attenuation given normal wall motion on gated imaging.  No clear ischemic territories.   Left ventricular function is normal. Nuclear stress EF: 58%. End diastolic cavity size is normal. End systolic cavity size is normal.  Low risk study with soft tissue attenuation affecting the apex, no definite ischemia, LVEF 58%.   ECHOCARDIOGRAM  ECHOCARDIOGRAM COMPLETE 04/26/2024  Narrative ECHOCARDIOGRAM REPORT    Patient Name:   Justin Villa Date of Exam: 04/26/2024 Medical Rec #:  [id number]        Height:       72.0 in Accession #:    [id number]       Weight:       235.0 lb Date of Birth:  [birth year]         BSA:          2.282 m Patient Age:    58 years         BP:           121/73 mmHg Patient Gender: M                HR:           67 bpm. Exam Location:  Inpatient  Procedure: 2D Echo and Strain Analysis (Both Spectral and Color Flow Doppler were utilized during procedure).  Indications:    NSTEMI  History:        Patient has prior history of Echocardiogram examinations. CAD; Prior CABG.  Sonographer:    Charmaine Gaskins Referring Phys: [phone] [facility]  IMPRESSIONS   1. Left ventricular ejection fraction, by estimation, is 60 to 65%. The left ventricle has normal function. The left ventricle has no regional wall motion abnormalities. There is mild left ventricular  hypertrophy. Left ventricular diastolic parameters are consistent with Grade I diastolic dysfunction (impaired relaxation). The average left ventricular global longitudinal strain is -17.8 %. The global longitudinal strain is normal. 2. Right ventricular systolic function is normal. The right ventricular size is normal. 3. The mitral valve is abnormal. Trivial mitral valve regurgitation. No evidence of mitral stenosis. 4. The aortic valve is tricuspid. There is mild calcification of the aortic valve. There is mild thickening of the aortic valve. Aortic valve regurgitation is trivial. Aortic valve sclerosis is present, with no evidence of  aortic valve stenosis. 5. Aortic dilatation noted. There is mild dilatation of the ascending aorta, measuring 38 mm. 6. The inferior vena cava is normal in size with greater than 50% respiratory variability, suggesting right atrial pressure of 3 mmHg.  FINDINGS Left Ventricle: Left ventricular ejection fraction, by estimation, is 60 to 65%. The left ventricle has normal function. The left ventricle has no regional wall motion abnormalities. The average left ventricular global longitudinal strain is -17.8 %. Strain was performed and the global longitudinal strain is normal. The left ventricular internal cavity size was normal in size. There is mild left ventricular hypertrophy. Left ventricular diastolic parameters are consistent with Grade I diastolic dysfunction (impaired relaxation).  Right Ventricle: The right ventricular size is normal. No increase in right ventricular wall thickness. Right ventricular systolic function is normal.  Left Atrium: Left atrial size was normal in size.  Right Atrium: Right atrial size was normal in size.  Pericardium: There is no evidence of pericardial effusion.  Mitral Valve: The mitral valve is abnormal. There is mild thickening of the mitral valve leaflet(s). Trivial mitral valve regurgitation. No evidence of mitral valve  stenosis.  Tricuspid Valve: The tricuspid valve is normal in structure. Tricuspid valve regurgitation is not demonstrated. No evidence of tricuspid stenosis.  Aortic Valve: The aortic valve is tricuspid. There is mild calcification of the aortic valve. There is mild thickening of the aortic valve. Aortic valve regurgitation is trivial. Aortic valve sclerosis is present, with no evidence of aortic valve stenosis.  Pulmonic Valve: The pulmonic valve was normal in structure. Pulmonic valve regurgitation is not visualized. No evidence of pulmonic stenosis.  Aorta: Aortic dilatation noted. There is mild dilatation of the ascending aorta, measuring 38 mm.  Venous: The inferior vena cava is normal in size with greater than 50% respiratory variability, suggesting right atrial pressure of 3 mmHg.  IAS/Shunts: No atrial level shunt detected by color flow Doppler.  Additional Comments: 3D was performed not requiring image post processing on an independent workstation and was indeterminate.   LEFT VENTRICLE PLAX 2D LVIDd:         4.20 cm   Diastology LVIDs:         2.20 cm   LV e' medial:    7.94 cm/s LV PW:         1.40 cm   LV E/e' medial:  8.8 LV IVS:        1.30 cm   LV e' lateral:   7.94 cm/s LVOT diam:     2.20 cm   LV E/e' lateral: 8.8 LVOT Area:     3.80 cm 2D Longitudinal Strain 2D Strain GLS Avg:     -17.8 %  RIGHT VENTRICLE RV Basal diam:  2.50 cm RV Mid diam:    2.60 cm TAPSE (M-mode): 1.3 cm  LEFT ATRIUM             Index        RIGHT ATRIUM           Index LA diam:        3.70 cm 1.62 cm/m   RA Area:     10.00 cm LA Vol (A2C):   38.0 ml 16.65 ml/m  RA Volume:   17.60 ml  7.71 ml/m LA Vol (A4C):   43.8 ml 19.20 ml/m LA Biplane Vol: 42.4 ml 18.58 ml/m  AORTA Ao Root diam: 3.60 cm Ao Asc diam:  3.80 cm  MITRAL VALVE MV Area (PHT): 2.92 cm  SHUNTS MV Decel Time: 260 msec    Systemic Diam: 2.20 cm MV E velocity: 70.20 cm/s MV A velocity: 90.10 cm/s MV E/A  ratio:  0.78  Maude Emmer MD Electronically signed by Maude Emmer MD Signature Date/Time: 04/26/2024/11:26:37 AM    Final          ______________________________________________________________________________________________      Risk Assessment/Calculations           Physical Exam VS:  BP 122/70 (BP Location: Left Arm, Patient Position: Sitting, Cuff Size: Large)   Pulse 69   Ht 6' (1.829 m)   Wt 247 lb 12.8 oz (112.4 kg)   SpO2 95%   BMI 33.61 kg/m        Wt Readings from Last 3 Encounters:  05/14/24 247 lb 12.8 oz (112.4 kg)  04/25/24 235 lb 0.2 oz (106.6 kg)  04/24/24 235 lb (106.6 kg)    GEN: Well nourished, well developed in no acute distress NECK: No JVD; No carotid bruits CARDIAC: RRR, no murmurs, rubs, gallops RESPIRATORY:  Clear to auscultation without rales, wheezing or rhonchi  ABDOMEN: Soft, non-tender, non-distended EXTREMITIES:  No edema; No deformity   ASSESSMENT AND PLAN  CAD s/p CABG 2017- (LIMA-LAD, SVG-diag, SVG-OM1-OM2, and SVG-PDA) 04/26/24 cardiac catheterization that showed severe native CAD, including CTO of mid LAD, ostial Lcx, and proximal RCA. Widely patent LIMA-LAD, SVG-OM1-OM2, and SVG-rPDA. Occulded SVG- diagonal. Recommended medical therapy and aggressive secondary prevention. He previously declined cardiac rehab and is now willing to participate in program. DAPT for at least 12 months. Continue aspirin  81 mg, clopidogrel  75 mg, metoprolol  25 mg, and increase Imdur  to 60 mg.   Hyperlipidemia- 04/25/24 ALT-51. 04/26/24; HDL 28; LDL Cholesterol 97. He has an appointment 06/25/24 to discuss [id number] d/t statin intolerance. Encouraged Mediterranean diet.   Hypertension- He does not currently take his BP at home. Encouraged he take it daily and if he has any presyncope episodes, two hours after medications, feet flat on floor after resting for 10 min. Continue olmesartan  40 mg.        Dispo: Follow up with Dr. Floretta or APP in 4-6  weeks.   Signed, Mardy KATHEE Pizza, FNP

## 2024-05-16 ENCOUNTER — Telehealth: Payer: Self-pay

## 2024-05-16 NOTE — Telephone Encounter (Signed)
 Copied from CRM #[id number]. Topic: Appointments - Transfer of Care >> May 16, 2024  8:40 AM Avram MATSU wrote: Pt is requesting to transfer FROM: Glennon Sand, NP Pt is requesting to transfer TO: Sherlynn Madden, MD Reason for requested transfer: provider left  It is the responsibility of the team the patient would like to transfer to (Dr. Sherlynn Madden, MD) to reach out to the patient if for any reason this transfer is not acceptable.

## 2024-05-21 ENCOUNTER — Encounter: Payer: Self-pay | Admitting: Gastroenterology

## 2024-05-21 ENCOUNTER — Ambulatory Visit: Admitting: Sports Medicine

## 2024-05-21 ENCOUNTER — Encounter: Payer: Self-pay | Admitting: Sports Medicine

## 2024-05-21 VITALS — BP 107/73 | HR 56 | Temp 97.9°F | Ht 72.0 in | Wt 245.0 lb

## 2024-05-21 DIAGNOSIS — Z9189 Other specified personal risk factors, not elsewhere classified: Secondary | ICD-10-CM

## 2024-05-21 DIAGNOSIS — R2232 Localized swelling, mass and lump, left upper limb: Secondary | ICD-10-CM

## 2024-05-21 DIAGNOSIS — R0602 Shortness of breath: Secondary | ICD-10-CM

## 2024-05-21 DIAGNOSIS — I2510 Atherosclerotic heart disease of native coronary artery without angina pectoris: Secondary | ICD-10-CM

## 2024-05-21 DIAGNOSIS — Z23 Encounter for immunization: Secondary | ICD-10-CM

## 2024-05-21 DIAGNOSIS — K219 Gastro-esophageal reflux disease without esophagitis: Secondary | ICD-10-CM

## 2024-05-21 NOTE — Patient Instructions (Signed)

## 2024-05-21 NOTE — Progress Notes (Signed)
 New Patient Office Visit  Patient ID: Justin Villa, Male   DOB: 1966/03/07 58 y.o. MRN: [id number] Subjective:     Discussed the use of AI scribe software for clinical note transcription with the patient, who gave verbal consent to proceed.  History of Present Illness  Justin Villa is a 58 year old male with coronary artery disease who presents to establish are c/o worsening shortness of breath.  He has experienced worsening shortness of breath since his last hospital visit in November, now unable to walk more than half a block without stopping due to breathlessness. He denies orthopnea or paroxysmal nocturnal dyspnea but experiences palpitations occasionally during the day.  He underwent cardiac cath last month  As per cath report  ''1st Diag lesion is 99% stenosed.   Mid LAD-1 lesion is 75% stenosed with 75% stenosed side branch in 2nd Diag.   Mid LAD-2 lesion is 100% stenosed.   Origin to Insertion lesion is 100% stenosed.   Ramus lesion is 90% stenosed.   Ost Cx to Dist Cx lesion is 100% stenosed.   Prox RCA to Dist RCA lesion is 100% stenosed.   Prox Graft lesion is 20% stenosed.   RPDA-2 lesion is 50% stenosed.   RPDA-1 lesion is 30% stenosed.   RPAV lesion is 70% stenosed.   2nd RPL lesion is 80% stenosed.   LIMA graft was visualized by angiography and is normal in caliber.   SVG graft was visualized by angiography.   Seq SVG- OM1 and OM2 graft was visualized by angiography and is large and anatomically normal.   SVG graft was visualized by angiography and is normal in caliber.   The left ventricular systolic function is normal.''  He is currently taking aspirin , Plavix , Benicar , Isosorbide , and Toprol . He cannot tolerate statins due to severe muscle aches and is awaiting an appointment for [facility]   No heartburn, acid reflux, or urinary issues are reported.  He has a history of carpal tunnel syndrome and reports worsening soreness in his hand over the past five  years. He noticed growth on top of his left hand. 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    He consumes alcohol regularly but denies smoking. He has gained approximately 40 pounds and used to exercise regularly but is now limited by his shortness of breath.  He reports feeling sleepy in the evenings and sometimes in the mornings. He snores but has not had a sleep study done. No seasonal allergies or congestion are reported.   Outpatient Encounter Medications as of 05/21/2024  Medication Sig   aspirin  EC 81 MG EC tablet Take 1 tablet (81 mg total) by mouth daily.   Cholecalciferol  (VITAMIN D ) 50 MCG (2000 UT) CAPS Take 2,000 Units by mouth daily.   clopidogrel  (PLAVIX ) 75 MG tablet Take 1 tablet (75 mg total) by mouth daily with breakfast.   isosorbide  mononitrate (IMDUR ) 60 MG 24 hr tablet Take 1 tablet (60 mg total) by mouth daily.   metoprolol  succinate (TOPROL -XL) 25 MG 24 hr tablet TAKE 1 TABLET BY MOUTH DAILY   nitroGLYCERIN  (NITROSTAT ) 0.4 MG SL tablet Place 1 tablet (0.4 mg total) under the tongue every 5 (five) minutes x 3 doses as needed for chest pain.   olmesartan  (BENICAR ) 40 MG tablet Take 1 tablet (40 mg total) by mouth daily.   pantoprazole  (PROTONIX ) 40 MG tablet TAKE 1 TABLET BY MOUTH 2 TIMES A DAY BEFORE A MEAL  No facility-administered encounter medications on file as of 05/21/2024.    Past Medical History:  Diagnosis Date   CAD (coronary artery disease) 11/27/2015   a. S/p NSTEMI 6/17 >> s/p CABG // b. LHC 6/17: oLAD 50, mLAD 100, D1 70, D2 95, oLCx 95, mLCx 65, OM1 85, OM2 90, mRCA 85, dRCA 75, RPDA 40/50, RPLB2 50    Carotid stenosis    a. Carotid US  6/17: bilat ICA 1-39%   Essential hypertension    GERD (gastroesophageal reflux disease)    History of non-ST elevation myocardial infarction (NSTEMI) 11/2015   Hyperlipidemia    Stroke [facility])     Past Surgical History:  Procedure Laterality Date   AMPUTATION Right 08/23/2023   Procedure:  AMPUTATION, FOOT, RAY;  Surgeon: Harden Jerona GAILS, MD;  Location: [facility] OR;  Service: Orthopedics;  Laterality: Right;  RIGHT 5TH RAY AMPUTATION   BACK SURGERY     BIOPSY  08/25/2022   Procedure: BIOPSY;  Surgeon: Cindie Carlin POUR, DO;  Location: [facility] ENDO SUITE;  Service: Endoscopy;;   CARDIAC CATHETERIZATION N/A 11/26/2015   Procedure: Left Heart Cath and Coronary Angiography;  Surgeon: Victory LELON Sharps, MD;  [facility] LAB;  Service: Cardiovascular;  Laterality: N/A;   CARPAL TUNNEL RELEASE Left 10/19/2022   Procedure: CARPAL TUNNEL RELEASE;  Surgeon: Margrette Taft BRAVO, MD;  Location: [facility] ORS;  Service: Orthopedics;  Laterality: Left;   COLONOSCOPY WITH PROPOFOL  N/A 08/25/2022   Procedure: COLONOSCOPY WITH PROPOFOL ;  Surgeon: Cindie Carlin POUR, DO;  Location: [facility] ENDO SUITE;  Service: Endoscopy;  Laterality: N/A;  10:30am, asa 3   CORONARY ARTERY BYPASS GRAFT N/A 11/27/2015   Procedure: CORONARY ARTERY BYPASS GRAFTING (CABG) x 5 (LIMA to LAD, SVG to DIAGONAL, SVG SEQUENTIALLY to OM1 and OM2, SVG to PDA) with EVH from right leg using greater saphenous vein and mammary.;  Surgeon: Dallas KATHEE Jude, MD;  Location: [facility] OR;  Service: Open Heart Surgery;  Laterality: N/A;   ESOPHAGOGASTRODUODENOSCOPY (EGD) WITH PROPOFOL  N/A 08/25/2022   Procedure: ESOPHAGOGASTRODUODENOSCOPY (EGD) WITH PROPOFOL ;  Surgeon: Cindie Carlin POUR, DO;  Location: [facility];  Service: Endoscopy;  Laterality: N/A;   INCISION AND DRAINAGE ABSCESS Right 06/16/2020   Procedure: INCISION AND DRAINAGE ABSCESS RIGHT PINKY TOE (5th toe);  Surgeon: Margrette Taft BRAVO, MD;  Location: [facility] ORS;  Service: Orthopedics;  Laterality: Right;   INTRAOPERATIVE TRANSESOPHAGEAL ECHOCARDIOGRAM N/A 11/27/2015   Procedure: INTRAOPERATIVE TRANSESOPHAGEAL ECHOCARDIOGRAM;  Surgeon: Dallas KATHEE Jude, MD;  Location: [facility] OR;  Service: Open Heart Surgery;  Laterality: N/A;   KNEE SURGERY Left    arthroscopy   LEFT HEART CATH AND CORS/GRAFTS  ANGIOGRAPHY N/A 04/25/2024   Procedure: LEFT HEART CATH AND CORS/GRAFTS ANGIOGRAPHY;  Surgeon: Mady Bruckner, MD;  Location: [facility] INVASIVE CV LAB;  Service: Cardiovascular;  Laterality: N/A;   POLYPECTOMY  08/25/2022   Procedure: POLYPECTOMY;  Surgeon: Cindie Carlin POUR, DO;  Location: [facility];  Service: Endoscopy;;    Family History  Problem Relation Age of Onset   Heart disease Father    Colon cancer Neg Hx     Social History   Socioeconomic History   Marital status: Single    Spouse name: Not on file   Number of children: Not on file   Years of education: Not on file   Highest education level: 12th grade  Occupational History   Not on file  Tobacco Use   Smoking status: Never   Smokeless tobacco: Never  Vaping Use  Vaping status: Never Used  Substance and Sexual Activity   Alcohol use: Yes    Alcohol/week: 2.0 standard drinks of alcohol    Types: 2 Shots of liquor per week    Comment: everyother day   Drug use: No   Sexual activity: Yes  Other Topics Concern   Not on file  Social History Narrative   Not on file   Social Drivers of Health   Tobacco Use: Low Risk (05/21/2024)   Patient History    Smoking Tobacco Use: Never    Smokeless Tobacco Use: Never    Passive Exposure: Not on file  Financial Resource Strain: Low Risk (05/17/2024)   Overall Financial Resource Strain (CARDIA)    Difficulty of Paying Living Expenses: Not hard at all  Food Insecurity: No Food Insecurity (05/17/2024)   Epic    Worried About Radiation Protection Practitioner of Food in the Last Year: Never true    Ran Out of Food in the Last Year: Never true  Transportation Needs: No Transportation Needs (05/17/2024)   Epic    Lack of Transportation (Medical): No    Lack of Transportation (Non-Medical): No  Physical Activity: Inactive (05/17/2024)   Exercise Vital Sign    Days of Exercise per Week: 0 days    Minutes of Exercise per Session: Not on file  Stress: No Stress Concern Present (05/17/2024)    [employer] of Occupational Health - Occupational Stress Questionnaire    Feeling of Stress: Not at all  Social Connections: Moderately Isolated (05/17/2024)   Social Connection and Isolation Panel    Frequency of Communication with Friends and Family: More than three times a week    Frequency of Social Gatherings with Friends and Family: Twice a week    Attends Religious Services: More than 4 times per year    Active Member of [employer] or Organizations: No    Attends Banker Meetings: Not on file    Marital Status: Divorced  Intimate Partner Violence: Not At Risk (04/25/2024)   Epic    Fear of Current or Ex-Partner: No    Emotionally Abused: No    Physically Abused: No    Sexually Abused: No  Depression (PHQ2-9): Low Risk (05/21/2024)   Depression (PHQ2-9)    PHQ-2 Score: 0  Recent Concern: Depression (PHQ2-9) - Medium Risk (05/21/2024)   Depression (PHQ2-9)    PHQ-2 Score: 6  Alcohol Screen: Low Risk (05/17/2024)   Alcohol Screen    Last Alcohol Screening Score (AUDIT): 3  Housing: Unknown (05/17/2024)   Epic    Unable to Pay for Housing in the Last Year: No    Number of Times Moved in the Last Year: Not on file    Homeless in the Last Year: No  Utilities: Not At Risk (04/25/2024)   Epic    Threatened with loss of utilities: No  Health Literacy: Not on file    Review of Systems  Constitutional:  Negative for chills and fever.  HENT:  Negative for congestion and sore throat.   Eyes:  Negative for double vision.  Respiratory:  Positive for shortness of breath. Negative for cough and sputum production.   Cardiovascular:  Negative for chest pain, palpitations and leg swelling.  Gastrointestinal:  Negative for abdominal pain, heartburn and nausea.  Genitourinary:  Negative for dysuria, frequency and hematuria.  Musculoskeletal:  Positive for joint pain. Negative for falls and myalgias.  Neurological:  Negative for dizziness, sensory change and focal weakness.   Psychiatric/Behavioral:  Negative  for depression.      Objective:    BP 107/73   Pulse (!) 56   Temp 97.9 F (36.6 C) (Oral)   Ht 6' (1.829 m)   Wt 245 lb (111.1 kg)   SpO2 96%   BMI 33.23 kg/m   Physical Exam Constitutional:      Appearance: Normal appearance.  HENT:     Head: Normocephalic and atraumatic.     Right Ear: Tympanic membrane normal.     Left Ear: Tympanic membrane normal.     Mouth/Throat:     Pharynx: No posterior oropharyngeal erythema.  Cardiovascular:     Rate and Rhythm: Normal rate and regular rhythm.     Pulses: Normal pulses.     Heart sounds: Normal heart sounds.  Pulmonary:     Effort: No respiratory distress.     Breath sounds: No stridor. No wheezing or rales.  Abdominal:     General: Bowel sounds are normal. There is no distension.     Palpations: Abdomen is soft.     Tenderness: There is no abdominal tenderness. There is no guarding.  Musculoskeletal:        General: No swelling.  Neurological:     Mental Status: He is alert. Mental status is at baseline.     Sensory: No sensory deficit.     Motor: No weakness.     Last CBC Lab Results  Component Value Date   WBC 6.5 04/26/2024   HGB 16.0 04/26/2024   HCT 46.7 04/26/2024   MCV 89.5 04/26/2024   MCH 30.7 04/26/2024   RDW 14.8 04/26/2024   PLT 224 04/26/2024   Last metabolic panel Lab Results  Component Value Date   GLUCOSE 129 (H) 04/26/2024   NA 136 04/26/2024   K 3.9 04/26/2024   CL 103 04/26/2024   CO2 19 (L) 04/26/2024   BUN 14 04/26/2024   CREATININE 1.13 04/26/2024   GFRNONAA >60 04/26/2024   CALCIUM  8.8 (L) 04/26/2024   PROT 6.9 04/25/2024   ALBUMIN  4.2 04/25/2024   LABGLOB 2.2 01/19/2024   AGRATIO 1.7 06/17/2022   BILITOT 1.9 (H) 04/25/2024   ALKPHOS 49 04/25/2024   AST 55 (H) 04/25/2024   ALT 51 (H) 04/25/2024   ANIONGAP 14 04/26/2024   Last lipids Lab Results  Component Value Date   CHOL 165 04/26/2024   HDL 28 (L) 04/26/2024   LDLCALC 97  04/26/2024   TRIG 202 (H) 04/26/2024   CHOLHDL 5.9 04/26/2024   Last hemoglobin A1c Lab Results  Component Value Date   HGBA1C 5.1 04/26/2024   Last thyroid  functions Lab Results  Component Value Date   TSH 2.260 01/19/2024   FREET4 1.23 01/19/2024   Last vitamin D  Lab Results  Component Value Date   VD25OH 26.8 (L) 06/17/2022   Last vitamin B12 and Folate Lab Results  Component Value Date   VITAMINB12 352 06/17/2022   FOLATE 5.1 06/17/2022       Assessment & Plan:   Assessment & Plan Coronary artery disease involving native coronary artery of native heart without angina pectoris  SOB (shortness of breath) on exertion Pt c/o exertional SOB Denies chest pain  Vitals stable Instructed patient to follow up with cardiology Cont with aspirin , plavix , metoprolol , imdur  Instructed patient to cut down on drinking    At risk for sleep apnea High risk for sleep apnea Orders:   Ambulatory referral to Sleep Studies  Gastroesophageal reflux disease, unspecified whether esophagitis present Denies bloody or  dark stools Cont with protonix     Mass of left hand Pt c/o mass on his left hand  Since few years  Gradually increasing in size 2cm, firm- hard consistency Not mobile Will get x ray Orders:   DG Hand Complete Left; Future  Immunization due  Orders:   Flu vaccine trivalent PF, 6mos and older(Flulaval,Afluria,Fluarix,Fluzone)   Return in about 3 months (around 08/19/2024).   Jackalyn Blazing, MD [facility]

## 2024-05-21 NOTE — Assessment & Plan Note (Deleted)
 Justin Villa

## 2024-05-21 NOTE — Assessment & Plan Note (Addendum)
 Denies bloody or dark stools Cont with protonix 

## 2024-05-22 ENCOUNTER — Ambulatory Visit: Admitting: Student

## 2024-05-23 ENCOUNTER — Telehealth (HOSPITAL_COMMUNITY): Payer: Self-pay

## 2024-05-23 NOTE — Telephone Encounter (Signed)
 Pt friend Hadassah called to see about scheduling pt for cardiac rehab. I advised Hadassah, that pt declined the program back in November. Hadassah stated that she was aware of that but he has changed his mind and would like to participate. I advised Hadassah that the pt would have been cleared but we are scheduling out mid Jan-Feb of 2026. I advised her that the schedules have not been made yet and that we have a waitlist. Hadassah understood and asked about other cardiac rehab places. I told her about [facility] and provided the number she will call back if they are able to get in sooner there.

## 2024-05-27 ENCOUNTER — Encounter (HOSPITAL_COMMUNITY): Payer: Self-pay

## 2024-05-27 ENCOUNTER — Encounter (HOSPITAL_COMMUNITY): Admission: RE | Admit: 2024-05-27 | Discharge: 2024-05-27 | Attending: Internal Medicine

## 2024-05-27 DIAGNOSIS — I214 Non-ST elevation (NSTEMI) myocardial infarction: Secondary | ICD-10-CM | POA: Insufficient documentation

## 2024-05-27 NOTE — Progress Notes (Signed)
 Virtual orientation visit completed for cardiac rehab with NSTEMI. On-site orientation visit scheduled for 05/28/24.

## 2024-05-28 ENCOUNTER — Encounter (HOSPITAL_COMMUNITY)
Admission: RE | Admit: 2024-05-28 | Discharge: 2024-05-28 | Disposition: A | Discharge status: Home / Self Care | Source: Ambulatory Visit | Attending: Internal Medicine | Admitting: Internal Medicine

## 2024-05-28 VITALS — BP 120/80 | HR 57 | Ht 72.0 in | Wt 245.6 lb

## 2024-05-28 DIAGNOSIS — I214 Non-ST elevation (NSTEMI) myocardial infarction: Secondary | ICD-10-CM

## 2024-05-28 NOTE — Progress Notes (Signed)
 Cardiac Individual Treatment Plan  Patient Details  Name: Justin Villa MRN: [id number] Date of Birth: 01-26-1966 Referring Provider:   Flowsheet Row CARDIAC REHAB PHASE II ORIENTATION from 05/28/2024 in [facility] CARDIAC REHABILITATION  Referring Provider End, Lonni MD  [Dr. McDowell]    Initial Encounter Date:  Flowsheet Row CARDIAC REHAB PHASE II ORIENTATION from 05/28/2024 in [place] CARDIAC REHABILITATION  Date 05/28/24    Visit Diagnosis: NSTEMI (non-ST elevated myocardial infarction) [facility])  Patient's Home Medications on Admission: Current Medications[1]  Past Medical History: Past Medical History:  Diagnosis Date   CAD (coronary artery disease) 11/27/2015   a. S/p NSTEMI 6/17 >> s/p CABG // b. LHC 6/17: oLAD 50, mLAD 100, D1 70, D2 95, oLCx 95, mLCx 65, OM1 85, OM2 90, mRCA 85, dRCA 75, RPDA 40/50, RPLB2 50    Carotid stenosis    a. Carotid US  6/17: bilat ICA 1-39%   Essential hypertension    GERD (gastroesophageal reflux disease)    History of non-ST elevation myocardial infarction (NSTEMI) 11/2015   Hyperlipidemia    Stroke (HCC)     Tobacco Use: Tobacco Use History[2]  Labs: Review Flowsheet  More data exists      Latest Ref Rng & Units 11/27/2015 11/28/2015 10/22/2019 06/17/2022 04/26/2024  Labs for ITP Cardiac and Pulmonary Rehab  Cholestrol 0 - 200 mg/dL - - 849  843  834   LDL (calc) 0 - 99 mg/dL - - 90  90  97   HDL-C >40 mg/dL - - 28  33  28   Trlycerides <150 mg/dL - - 840  809  797   Hemoglobin A1c 4.8 - 5.6 % 5.1  - - 5.7  5.1   PH, Arterial 7.350 - 7.450 7.325  7.320  7.320  7.377  - - - -  PCO2 arterial 35.0 - 45.0 mmHg 41.5  42.2  38.9  42.5  - - - -  Bicarbonate 20.0 - 24.0 mEq/L 21.5  21.7  20.1  25.0  - - - -  TCO2 0 - 100 mmol/L 23  23  23  21  20  25  25  26  27  26  24  23   - - -  Acid-base deficit 0.0 - 2.0 mmol/L 4.0  4.0  6.0  - - - -  O2 Saturation % 91.0  97.0  88.0  100.0  - - - -    Details       Multiple values from  one day are sorted in reverse-chronological order          Exercise Target Goals: Exercise Program Goal: Individual exercise prescription set using results from initial 6 min walk test and THRR while considering  patients activity barriers and safety.   Exercise Prescription Goal: Initial exercise prescription builds to 30-45 minutes a day of aerobic activity, 2-3 days per week.  Home exercise guidelines will be given to patient during program as part of exercise prescription that the participant will acknowledge.   Education: Aerobic Exercise: - Group verbal and visual presentation on the components of exercise prescription. Introduces F.I.T.T principle from ACSM for exercise prescriptions.  Reviews F.I.T.T. principles of aerobic exercise including progression. Written material provided at class time.   Education: Resistance Exercise: - Group verbal and visual presentation on the components of exercise prescription. Introduces F.I.T.T principle from ACSM for exercise prescriptions  Reviews F.I.T.T. principles of resistance exercise including progression. Written material provided at class time.    Education:  Exercise & Equipment Safety: - Individual verbal instruction and demonstration of equipment use and safety with use of the equipment.   Education: Exercise Physiology & General Exercise Guidelines: - Group verbal and written instruction with models to review the exercise physiology of the cardiovascular system and associated critical values. Provides general exercise guidelines with specific guidelines to those with heart or lung disease. Written material provided at class time. Flowsheet Row CARDIAC VIRTUAL BASED CARE from 05/27/2024 in [place] CARDIAC REHABILITATION  Education need identified 05/28/24    Education: Flexibility, Balance, Mind/Body Relaxation: - Group verbal and visual presentation with interactive activity on the components of exercise prescription.  Introduces F.I.T.T principle from ACSM for exercise prescriptions. Reviews F.I.T.T. principles of flexibility and balance exercise training including progression. Also discusses the mind body connection.  Reviews various relaxation techniques to help reduce and manage stress (i.e. Deep breathing, progressive muscle relaxation, and visualization). Balance handout provided to take home. Written material provided at class time.   Activity Barriers & Risk Stratification:  Activity Barriers & Cardiac Risk Stratification - 05/27/24 0933       Activity Barriers & Cardiac Risk Stratification   Activity Barriers Shortness of Breath    Cardiac Risk Stratification High          6 Minute Walk:  6 Minute Walk     Row Name 05/28/24 1426         6 Minute Walk   Phase Initial     Distance 1500 feet     Walk Time 6 minutes     # of Rest Breaks 0     MPH 2.8     METS 3.4     RPE 11     Perceived Dyspnea  0     VO2 Peak 11.8     Symptoms No     Resting HR 57 bpm     Resting BP 120/80     Resting Oxygen Saturation  98 %     Exercise Oxygen Saturation  during 6 min walk 98 %     Max Ex. HR 58 bpm     Max Ex. BP 140/60     2 Minute Post BP 120/60        Oxygen Initial Assessment:   Oxygen Re-Evaluation:   Oxygen Discharge (Final Oxygen Re-Evaluation):   Initial Exercise Prescription:  Initial Exercise Prescription - 05/28/24 1400       Date of Initial Exercise RX and Referring Provider   Date 05/28/24    Referring Provider End, Lonni MD   Dr. Debera     Treadmill   MPH 2.4    Grade 0.5    Minutes 15    METs 3      REL-XR   Level 3    Speed 50    Minutes 15    METs 2.5      Prescription Details   Frequency (times per week) 3    Duration Progress to 30 minutes of continuous aerobic without signs/symptoms of physical distress      Intensity   THRR 40-80% of Max Heartrate 98-139    Ratings of Perceived Exertion 11-13    Perceived Dyspnea 0-4       Resistance Training   Training Prescription Yes    Weight 7    Reps 10-15          Perform Capillary Blood Glucose checks as needed.  Exercise Prescription Changes:   Exercise Prescription Changes  Row Name 05/28/24 1400             Response to Exercise   Blood Pressure (Admit) 120/80       Blood Pressure (Exercise) 140/60       Blood Pressure (Exit) 120/60       Heart Rate (Admit) 57 bpm       Heart Rate (Exercise) 85 bpm       Heart Rate (Exit) 68 bpm       Oxygen Saturation (Admit) 98 %       Oxygen Saturation (Exercise) 98 %       Oxygen Saturation (Exit) 98 %       Rating of Perceived Exertion (Exercise) 11       Perceived Dyspnea (Exercise) 0          Exercise Comments:   Exercise Goals and Review:   Exercise Goals     Row Name 05/28/24 1430             Exercise Goals   Increase Physical Activity Yes       Intervention Provide advice, education, support and counseling about physical activity/exercise needs.;Develop an individualized exercise prescription for aerobic and resistive training based on initial evaluation findings, risk stratification, comorbidities and participant's personal goals.       Expected Outcomes Short Term: Attend rehab on a regular basis to increase amount of physical activity.;Long Term: Add in home exercise to make exercise part of routine and to increase amount of physical activity.;Long Term: Exercising regularly at least 3-5 days a week.       Increase Strength and Stamina Yes       Intervention Provide advice, education, support and counseling about physical activity/exercise needs.;Develop an individualized exercise prescription for aerobic and resistive training based on initial evaluation findings, risk stratification, comorbidities and participant's personal goals.       Expected Outcomes Short Term: Increase workloads from initial exercise prescription for resistance, speed, and METs.;Short Term: Perform resistance  training exercises routinely during rehab and add in resistance training at home;Long Term: Improve cardiorespiratory fitness, muscular endurance and strength as measured by increased METs and functional capacity ( )       Able to understand and use rate of perceived exertion (RPE) scale Yes       Intervention Provide education and explanation on how to use RPE scale       Expected Outcomes Short Term: Able to use RPE daily in rehab to express subjective intensity level;Long Term:  Able to use RPE to guide intensity level when exercising independently       Able to understand and use Dyspnea scale Yes       Intervention Provide education and explanation on how to use Dyspnea scale       Expected Outcomes Short Term: Able to use Dyspnea scale daily in rehab to express subjective sense of shortness of breath during exertion;Long Term: Able to use Dyspnea scale to guide intensity level when exercising independently       Knowledge and understanding of Target Heart Rate Range (THRR) Yes       Intervention Provide education and explanation of THRR including how the numbers were predicted and where they are located for reference       Expected Outcomes Short Term: Able to state/look up THRR;Long Term: Able to use THRR to govern intensity when exercising independently;Short Term: Able to use daily as guideline for intensity in rehab       Able  to check pulse independently Yes       Intervention Provide education and demonstration on how to check pulse in carotid and radial arteries.;Review the importance of being able to check your own pulse for safety during independent exercise       Expected Outcomes Short Term: Able to explain why pulse checking is important during independent exercise;Long Term: Able to check pulse independently and accurately       Understanding of Exercise Prescription Yes       Intervention Provide education, explanation, and written materials on patient's individual exercise  prescription       Expected Outcomes Short Term: Able to explain program exercise prescription;Long Term: Able to explain home exercise prescription to exercise independently          Exercise Goals Re-Evaluation :   Discharge Exercise Prescription (Final Exercise Prescription Changes):  Exercise Prescription Changes - 05/28/24 1400       Response to Exercise   Blood Pressure (Admit) 120/80    Blood Pressure (Exercise) 140/60    Blood Pressure (Exit) 120/60    Heart Rate (Admit) 57 bpm    Heart Rate (Exercise) 85 bpm    Heart Rate (Exit) 68 bpm    Oxygen Saturation (Admit) 98 %    Oxygen Saturation (Exercise) 98 %    Oxygen Saturation (Exit) 98 %    Rating of Perceived Exertion (Exercise) 11    Perceived Dyspnea (Exercise) 0          Nutrition:  Target Goals: Understanding of nutrition guidelines, daily intake of sodium 1500mg , cholesterol 200mg , calories 30% from fat and 7% or less from saturated fats, daily to have 5 or more servings of fruits and vegetables.  Education: Nutrition 1 -Group instruction provided by verbal, written material, interactive activities, discussions, models, and posters to present general guidelines for heart healthy nutrition including macronutrients, label reading, and promoting whole foods over processed counterparts. Education serves as pensions consultant of discussion of heart healthy eating for all. Written material provided at class time. Flowsheet Row CARDIAC VIRTUAL BASED CARE from 05/27/2024 in [place] CARDIAC REHABILITATION  Education need identified 05/28/24     Education: Nutrition 2 -Group instruction provided by verbal, written material, interactive activities, discussions, models, and posters to present general guidelines for heart healthy nutrition including sodium, cholesterol, and saturated fat. Providing guidance of habit forming to improve blood pressure, cholesterol, and body weight. Written material provided at class  time. Flowsheet Row CARDIAC VIRTUAL BASED CARE from 05/27/2024 in [place] CARDIAC REHABILITATION  Education need identified 05/28/24      Biometrics:  Pre Biometrics - 05/28/24 1431       Pre Biometrics   Height 6' (1.829 m)    Weight 111.4 kg    Waist Circumference 45 inches    Hip Circumference 43 inches    Waist to Hip Ratio 1.05 %    BMI (Calculated) 33.3    Grip Strength 24.5 kg    Single Leg Stand 25.06 seconds           Nutrition Therapy Plan and Nutrition Goals:   Nutrition Assessments:  MEDIFICTS Score Key: >=70 Need to make dietary changes  40-70 Heart Healthy Diet <= 40 Therapeutic Level Cholesterol Diet  Flowsheet Row CARDIAC VIRTUAL BASED CARE from 05/27/2024 in [facility] CARDIAC REHABILITATION  Picture Your Plate Total Score on Admission 55   Picture Your Plate Scores: <59 Unhealthy dietary pattern with much room for improvement. 41-50 Dietary pattern unlikely to meet recommendations  for good health and room for improvement. 51-60 More healthful dietary pattern, with some room for improvement.  >60 Healthy dietary pattern, although there may be some specific behaviors that could be improved.    Nutrition Goals Re-Evaluation:   Nutrition Goals Discharge (Final Nutrition Goals Re-Evaluation):   Psychosocial: Target Goals: Acknowledge presence or absence of significant depression and/or stress, maximize coping skills, provide positive support system. Participant is able to verbalize types and ability to use techniques and skills needed for reducing stress and depression.   Education: Stress, Anxiety, and Depression - Group verbal and visual presentation to define topics covered.  Reviews how body is impacted by stress, anxiety, and depression.  Also discusses healthy ways to reduce stress and to treat/manage anxiety and depression. Written material provided at class time.   Education: Sleep Hygiene -Provides group verbal and written  instruction about how sleep can affect your health.  Define sleep hygiene, discuss sleep cycles and impact of sleep habits. Review good sleep hygiene tips.   Initial Review & Psychosocial Screening:  Initial Psych Review & Screening - 05/27/24 1011       Initial Review   Current issues with None Identified      Family Dynamics   Good Support System? Yes    Comments Patient's girl friend and daughter support him.      Barriers   Psychosocial barriers to participate in program The patient should benefit from training in stress management and relaxation.;There are no identifiable barriers or psychosocial needs.      Screening Interventions   Interventions To provide support and resources with identified psychosocial needs;Encouraged to exercise;Provide feedback about the scores to participant    Expected Outcomes Short Term goal: Utilizing psychosocial counselor, staff and physician to assist with identification of specific Stressors or current issues interfering with healing process. Setting desired goal for each stressor or current issue identified.;Long Term Goal: Stressors or current issues are controlled or eliminated.;Short Term goal: Identification and review with participant of any Quality of Life or Depression concerns found by scoring the questionnaire.;Long Term goal: The participant improves quality of Life and PHQ9 Scores as seen by post scores and/or verbalization of changes          Quality of Life Scores:   Quality of Life - 05/28/24 0708       Quality of Life   Select Quality of Life      Quality of Life Scores   Health/Function Pre 24.47 %    Socioeconomic Pre 26.25 %    Psych/Spiritual Pre 28.93 %    Family Pre 30 %    GLOBAL Pre 26.56 %         Scores of 19 and below usually indicate a poorer quality of life in these areas.  A difference of  2-3 points is a clinically meaningful difference.  A difference of 2-3 points in the total score of the Quality of  Life Index has been associated with significant improvement in overall quality of life, self-image, physical symptoms, and general health in studies assessing change in quality of life.  PHQ-9: Review Flowsheet  More data exists      05/28/2024 05/21/2024 01/19/2024 04/10/2023 10/07/2022  Depression screen PHQ 2/9  Decreased Interest 1 0 0 2 0 2  Down, Depressed, Hopeless 0 0 0 0 0 0  PHQ - 2 Score 1 0 0 2 0 2  Altered sleeping 3 3 0 - 3  Tired, decreased energy 3 3 3  - 2  Change in appetite 1 0 0 - 0  Feeling bad or failure about yourself  0 0 0 - 0  Trouble concentrating 2 0 0 - 2  Moving slowly or fidgety/restless 1 0 1 - 0  Suicidal thoughts 0 0 0 - 0  PHQ-9 Score 11 6 6   - 9   Difficult doing work/chores Somewhat difficult Not difficult at all - - -    Details       Data saved with a previous flowsheet row definition   Multiple values from one day are sorted in reverse-chronological order        Interpretation of Total Score  Total Score Depression Severity:  1-4 = Minimal depression, 5-9 = Mild depression, 10-14 = Moderate depression, 15-19 = Moderately severe depression, 20-27 = Severe depression   Psychosocial Evaluation and Intervention:  Psychosocial Evaluation - 05/27/24 1012       Psychosocial Evaluation & Interventions   Interventions Stress management education;Relaxation education;Encouraged to exercise with the program and follow exercise prescription    Comments Patient referred to cardiac rehab with NSTEMI. Hs had a CABG in 2017. He was in a regular exercise routine up until 2 years ago when he developed fatigue and SOB which he continues to have. He denies any depression or anxiety. He sleeps well. His goals for the program are to lose some weigth; improve his fatigue and SOB and get back into an exercise routine. He has no barriers identified to complete the program.    Expected Outcomes Short Term: Patient will start the program and attend consistently. Long  Term: Patient will complete the program meeting personal goals.    Continue Psychosocial Services  Follow up required by staff          Psychosocial Re-Evaluation:   Psychosocial Discharge (Final Psychosocial Re-Evaluation):   Vocational Rehabilitation: Provide vocational rehab assistance to qualifying candidates.   Vocational Rehab Evaluation & Intervention:  Vocational Rehab - 05/27/24 1006       Initial Vocational Rehab Evaluation & Intervention   Assessment shows need for Vocational Rehabilitation No      Vocational Rehab Re-Evaulation   Comments Working full time as a psychologist, sport and exercise.          Education: Education Goals: Education classes will be provided on a variety of topics geared toward better understanding of heart health and risk factor modification. Participant will state understanding/return demonstration of topics presented as noted by education test scores.  Learning Barriers/Preferences:  Learning Barriers/Preferences - 05/27/24 1018       Learning Barriers/Preferences   Learning Barriers None    Learning Preferences Skilled Demonstration;Computer/Internet;Written Material;Audio          General Cardiac Education Topics:  AED/CPR: - Group verbal and written instruction with the use of models to demonstrate the basic use of the AED with the basic ABC's of resuscitation.   Test and Procedures: - Group verbal and visual presentation and models provide information about basic cardiac anatomy and function. Reviews the testing methods done to diagnose heart disease and the outcomes of the test results. Describes the treatment choices: Medical Management, Angioplasty, or Coronary Bypass Surgery for treating various heart conditions including Myocardial Infarction, Angina, Valve Disease, and Cardiac Arrhythmias. Written material provided at class time.   Medication Safety: - Group verbal and visual instruction to review commonly prescribed medications  for heart and lung disease. Reviews the medication, class of the drug, and side effects. Includes the steps to properly store meds and maintain  the prescription regimen. Written material provided at class time.   Intimacy: - Group verbal instruction through game format to discuss how heart and lung disease can affect sexual intimacy. Written material provided at class time.   Know Your Numbers and Heart Failure: - Group verbal and visual instruction to discuss disease risk factors for cardiac and pulmonary disease and treatment options.  Reviews associated critical values for Overweight/Obesity, Hypertension, Cholesterol, and Diabetes.  Discusses basics of heart failure: signs/symptoms and treatments.  Introduces Heart Failure Zone chart for action plan for heart failure. Written material provided at class time.   Infection Prevention: - Provides verbal and written material to individual with discussion of infection control including proper hand washing and proper equipment cleaning during exercise session.   Falls Prevention: - Provides verbal and written material to individual with discussion of falls prevention and safety.   Other: -Provides group and verbal instruction on various topics (see comments)   Knowledge Questionnaire Score:  Knowledge Questionnaire Score - 05/28/24 0707       Knowledge Questionnaire Score   Pre Score 23/26          Core Components/Risk Factors/Patient Goals at Admission:  Personal Goals and Risk Factors at Admission - 05/27/24 1006       Core Components/Risk Factors/Patient Goals on Admission    Weight Management Weight Maintenance    Improve shortness of breath with ADL's Yes    Intervention Provide education, individualized exercise plan and daily activity instruction to help decrease symptoms of SOB with activities of daily living.    Expected Outcomes Short Term: Improve cardiorespiratory fitness to achieve a reduction of symptoms when  performing ADLs;Long Term: Be able to perform more ADLs without symptoms or delay the onset of symptoms    Hypertension Yes    Intervention Provide education on lifestyle modifcations including regular physical activity/exercise, weight management, moderate sodium restriction and increased consumption of fresh fruit, vegetables, and low fat dairy, alcohol moderation, and smoking cessation.;Monitor prescription use compliance.    Expected Outcomes Short Term: Continued assessment and intervention until BP is < 140/72mm HG in hypertensive participants. < 130/63mm HG in hypertensive participants with diabetes, heart failure or chronic kidney disease.;Long Term: Maintenance of blood pressure at goal levels.    Lipids Yes    Intervention Provide education and support for participant on nutrition & aerobic/resistive exercise along with prescribed medications to achieve LDL 70mg , HDL >40mg .    Expected Outcomes Short Term: Participant states understanding of desired cholesterol values and is compliant with medications prescribed. Participant is following exercise prescription and nutrition guidelines.;Long Term: Cholesterol controlled with medications as prescribed, with individualized exercise RX and with personalized nutrition plan. Value goals: LDL < 70mg , HDL > 40 mg.          Education:Diabetes - Individual verbal and written instruction to review signs/symptoms of diabetes, desired ranges of glucose level fasting, after meals and with exercise. Acknowledge that pre and post exercise glucose checks will be done for 3 sessions at entry of program.   Core Components/Risk Factors/Patient Goals Review:    Core Components/Risk Factors/Patient Goals at Discharge (Final Review):    ITP Comments:  ITP Comments     Row Name 05/27/24 1023 05/28/24 1409         ITP Comments Virtual orientation visit completed for cardiac rehab with NSTEMI. On-site orientation visit scheduled for 05/28/24. Patient  arrived for 1st visit/orientation/education at 1330. Patient was referred to CR by Dr. Lonni End/Dr. Debera attending due to  NSTEMI. During orientation advised patient on arrival and appointment times what to wear, what to do before, during and after exercise. Reviewed attendance and class policy.  Pt is scheduled to return Cardiac Rehab on 06/03/24 at 745. Pt was advised to come to class 15 minutes before class starts.  Discussed RPE/Dpysnea scales. Patient participated in warm up stretches. Patient was able to complete 6 minute walk test.  Telemetry:NSR. Patient was measured for the equipment. Discussed equipment safety with patient. Took patient pre-anthropometric measurements. Patient finished visit at 1420.         Comments: Patient arrived for 1st visit/orientation/education at 1330. Patient was referred to CR by Dr. Lonni End/Dr. Debera attending due to NSTEMI. During orientation advised patient on arrival and appointment times what to wear, what to do before, during and after exercise. Reviewed attendance and class policy.  Pt is scheduled to return Cardiac Rehab on 06/03/24 at 745. Pt was advised to come to class 15 minutes before class starts.  Discussed RPE/Dpysnea scales. Patient participated in warm up stretches. Patient was able to complete 6 minute walk test.  Telemetry:NSR. Patient was measured for the equipment. Discussed equipment safety with patient. Took patient pre-anthropometric measurements. Patient finished visit at 1420.      [1]  Current Outpatient Medications:    aspirin  EC 81 MG EC tablet, Take 1 tablet (81 mg total) by mouth daily., Disp: , Rfl:    Cholecalciferol  (VITAMIN D ) 50 MCG (2000 UT) CAPS, Take 2,000 Units by mouth daily., Disp: , Rfl:    clopidogrel  (PLAVIX ) 75 MG tablet, Take 1 tablet (75 mg total) by mouth daily with breakfast., Disp: 90 tablet, Rfl: 3   isosorbide  mononitrate (IMDUR ) 60 MG 24 hr tablet, Take 1 tablet (60 mg total) by mouth  daily., Disp: 90 tablet, Rfl: 3   metoprolol  succinate (TOPROL -XL) 25 MG 24 hr tablet, TAKE 1 TABLET BY MOUTH DAILY, Disp: 90 tablet, Rfl: 3   nitroGLYCERIN  (NITROSTAT ) 0.4 MG SL tablet, Place 1 tablet (0.4 mg total) under the tongue every 5 (five) minutes x 3 doses as needed for chest pain., Disp: 25 tablet, Rfl: 2   olmesartan  (BENICAR ) 40 MG tablet, Take 1 tablet (40 mg total) by mouth daily., Disp: 30 tablet, Rfl: 11   pantoprazole  (PROTONIX ) 40 MG tablet, TAKE 1 TABLET BY MOUTH 2 TIMES A DAY BEFORE A MEAL, Disp: 60 tablet, Rfl: 5 [2]  Social History Tobacco Use  Smoking Status Never  Smokeless Tobacco Never

## 2024-05-28 NOTE — Patient Instructions (Signed)
 Patient Instructions  Patient Details  Name: Justin Villa MRN: [id number] Date of Birth: 1965-08-26 Referring Provider:  Mady Bruckner, MD  Below are your personal goals for exercise, nutrition, and risk factors. Our goal is to help you stay on track towards obtaining and maintaining these goals. We will be discussing your progress on these goals with you throughout the program.  Initial Exercise Prescription:  Initial Exercise Prescription - 05/28/24 1400       Date of Initial Exercise RX and Referring Provider   Date 05/28/24    Referring Provider End, Bruckner MD   Dr. Debera     Treadmill   MPH 2.4    Grade 0.5    Minutes 15    METs 3      REL-XR   Level 3    Speed 50    Minutes 15    METs 2.5      Prescription Details   Frequency (times per week) 3    Duration Progress to 30 minutes of continuous aerobic without signs/symptoms of physical distress      Intensity   THRR 40-80% of Max Heartrate 98-139    Ratings of Perceived Exertion 11-13    Perceived Dyspnea 0-4      Resistance Training   Training Prescription Yes    Weight 7    Reps 10-15          Exercise Goals: Frequency: Be able to perform aerobic exercise two to three times per week in program working toward 2-5 days per week of home exercise.  Intensity: Work with a perceived exertion of 11 (fairly light) - 15 (hard) while following your exercise prescription.  We will make changes to your prescription with you as you progress through the program.   Duration: Be able to do 30 to 45 minutes of continuous aerobic exercise in addition to a 5 minute warm-up and a 5 minute cool-down routine.   Nutrition Goals: Your personal nutrition goals will be established when you do your nutrition analysis with the dietician.  The following are general nutrition guidelines to follow: Cholesterol < 200mg /day Sodium < 1500mg /day Fiber: Men over 50 yrs - 30 grams per day  Personal Goals:  Personal Goals  and Risk Factors at Admission - 05/27/24 1006       Core Components/Risk Factors/Patient Goals on Admission    Weight Management Weight Maintenance    Improve shortness of breath with ADL's Yes    Intervention Provide education, individualized exercise plan and daily activity instruction to help decrease symptoms of SOB with activities of daily living.    Expected Outcomes Short Term: Improve cardiorespiratory fitness to achieve a reduction of symptoms when performing ADLs;Long Term: Be able to perform more ADLs without symptoms or delay the onset of symptoms    Hypertension Yes    Intervention Provide education on lifestyle modifcations including regular physical activity/exercise, weight management, moderate sodium restriction and increased consumption of fresh fruit, vegetables, and low fat dairy, alcohol moderation, and smoking cessation.;Monitor prescription use compliance.    Expected Outcomes Short Term: Continued assessment and intervention until BP is < 140/58mm HG in hypertensive participants. < 130/67mm HG in hypertensive participants with diabetes, heart failure or chronic kidney disease.;Long Term: Maintenance of blood pressure at goal levels.    Lipids Yes    Intervention Provide education and support for participant on nutrition & aerobic/resistive exercise along with prescribed medications to achieve LDL 70mg , HDL >40mg .    Expected Outcomes Short Term: Participant  states understanding of desired cholesterol values and is compliant with medications prescribed. Participant is following exercise prescription and nutrition guidelines.;Long Term: Cholesterol controlled with medications as prescribed, with individualized exercise RX and with personalized nutrition plan. Value goals: LDL < 70mg , HDL > 40 mg.          Exercise Goals and Review:  Exercise Goals     Row Name 05/28/24 1430             Exercise Goals   Increase Physical Activity Yes       Intervention Provide  advice, education, support and counseling about physical activity/exercise needs.;Develop an individualized exercise prescription for aerobic and resistive training based on initial evaluation findings, risk stratification, comorbidities and participant's personal goals.       Expected Outcomes Short Term: Attend rehab on a regular basis to increase amount of physical activity.;Long Term: Add in home exercise to make exercise part of routine and to increase amount of physical activity.;Long Term: Exercising regularly at least 3-5 days a week.       Increase Strength and Stamina Yes       Intervention Provide advice, education, support and counseling about physical activity/exercise needs.;Develop an individualized exercise prescription for aerobic and resistive training based on initial evaluation findings, risk stratification, comorbidities and participant's personal goals.       Expected Outcomes Short Term: Increase workloads from initial exercise prescription for resistance, speed, and METs.;Short Term: Perform resistance training exercises routinely during rehab and add in resistance training at home;Long Term: Improve cardiorespiratory fitness, muscular endurance and strength as measured by increased METs and functional capacity ( )       Able to understand and use rate of perceived exertion (RPE) scale Yes       Intervention Provide education and explanation on how to use RPE scale       Expected Outcomes Short Term: Able to use RPE daily in rehab to express subjective intensity level;Long Term:  Able to use RPE to guide intensity level when exercising independently       Able to understand and use Dyspnea scale Yes       Intervention Provide education and explanation on how to use Dyspnea scale       Expected Outcomes Short Term: Able to use Dyspnea scale daily in rehab to express subjective sense of shortness of breath during exertion;Long Term: Able to use Dyspnea scale to guide intensity  level when exercising independently       Knowledge and understanding of Target Heart Rate Range (THRR) Yes       Intervention Provide education and explanation of THRR including how the numbers were predicted and where they are located for reference       Expected Outcomes Short Term: Able to state/look up THRR;Long Term: Able to use THRR to govern intensity when exercising independently;Short Term: Able to use daily as guideline for intensity in rehab       Able to check pulse independently Yes       Intervention Provide education and demonstration on how to check pulse in carotid and radial arteries.;Review the importance of being able to check your own pulse for safety during independent exercise       Expected Outcomes Short Term: Able to explain why pulse checking is important during independent exercise;Long Term: Able to check pulse independently and accurately       Understanding of Exercise Prescription Yes       Intervention Provide education, explanation, and  written materials on patient's individual exercise prescription       Expected Outcomes Short Term: Able to explain program exercise prescription;Long Term: Able to explain home exercise prescription to exercise independently          Copy of goals given to participant.

## 2024-06-03 ENCOUNTER — Encounter (HOSPITAL_COMMUNITY)
Admission: RE | Admit: 2024-06-03 | Discharge: 2024-06-03 | Disposition: A | Discharge status: Home / Self Care | Source: Ambulatory Visit | Attending: Cardiology | Admitting: Cardiology

## 2024-06-03 DIAGNOSIS — I214 Non-ST elevation (NSTEMI) myocardial infarction: Secondary | ICD-10-CM

## 2024-06-03 NOTE — Progress Notes (Signed)
 Daily Session Note  Patient Details  Name: Justin Villa MRN: [id number] Date of Birth: [birth year] Referring Provider:   Flowsheet Row CARDIAC REHAB PHASE II ORIENTATION from 05/28/2024 in [facility] CARDIAC REHABILITATION  Referring Provider End, Lonni MD  [Dr. McDowell]    Encounter Date: 06/03/2024  Check In:  Session Check In - 06/03/24 0800       Check-In   Supervising physician immediately available to respond to emergencies See telemetry face sheet for immediately available MD    Location [facility]    Staff Present Laymon Rattler, BSN, RN, Rosalba Gelineau, MA, RCEP, CCRP, CCET    Virtual Visit No    Medication changes reported     No    Fall or balance concerns reported    No    Tobacco Cessation No Change    Warm-up and Cool-down Performed on first and last piece of equipment    Resistance Training Performed Yes    VAD Patient? No    PAD/SET Patient? No      Pain Assessment   Currently in Pain? No/denies          Capillary Blood Glucose: No results found for this or any previous visit (from the past 24 hours).    Tobacco Use History[1]  Goals Met:  Independence with exercise equipment Exercise tolerated well No report of concerns or symptoms today Strength training completed today  Goals Unmet:  Not Applicable  Comments: First full day of exercise!  Patient was oriented to gym and equipment including functions, settings, policies, and procedures.  Patient's individual exercise prescription and treatment plan were reviewed.  All starting workloads were established based on the results of the 6 minute walk test done at initial orientation visit.  The plan for exercise progression was also introduced and progression will be customized based on patient's performance and goals.        [1]  Social History Tobacco Use  Smoking Status Never  Smokeless Tobacco Never

## 2024-06-04 ENCOUNTER — Encounter (HOSPITAL_COMMUNITY): Payer: Self-pay | Admitting: *Deleted

## 2024-06-04 DIAGNOSIS — I214 Non-ST elevation (NSTEMI) myocardial infarction: Secondary | ICD-10-CM

## 2024-06-04 NOTE — Progress Notes (Signed)
 Cardiac Individual Treatment Plan  Patient Details  Name: Justin Villa MRN: [id number] Date of Birth: 1965/07/04 Referring Provider:   Flowsheet Row CARDIAC REHAB PHASE II ORIENTATION from 05/28/2024 in [facility] CARDIAC REHABILITATION  Referring Provider End, Lonni MD  [Dr. McDowell]    Initial Encounter Date:  Flowsheet Row CARDIAC REHAB PHASE II ORIENTATION from 05/28/2024 in [facility] [place] [facility] CARDIAC REHABILITATION  Date 05/28/24    Visit Diagnosis: NSTEMI (non-ST elevated myocardial infarction) [facility])  Patient's Home Medications on Admission: Current Medications[1]  Past Medical History: Past Medical History:  Diagnosis Date   CAD (coronary artery disease) 11/27/2015   a. S/p NSTEMI 6/17 >> s/p CABG // b. LHC 6/17: oLAD 50, mLAD 100, D1 70, D2 95, oLCx 95, mLCx 65, OM1 85, OM2 90, mRCA 85, dRCA 75, RPDA 40/50, RPLB2 50    Carotid stenosis    a. Carotid US  6/17: bilat ICA 1-39%   Essential hypertension    GERD (gastroesophageal reflux disease)    History of non-ST elevation myocardial infarction (NSTEMI) 11/2015   Hyperlipidemia    Stroke (HCC)     Tobacco Use: Tobacco Use History[2]  Labs: Review Flowsheet  More data exists      Latest Ref Rng & Units 11/27/2015 11/28/2015 10/22/2019 06/17/2022 04/26/2024  Labs for ITP Cardiac and Pulmonary Rehab  Cholestrol 0 - 200 mg/dL - - 849  843  834   LDL (calc) 0 - 99 mg/dL - - 90  90  97   HDL-C >40 mg/dL - - 28  33  28   Trlycerides <150 mg/dL - - 840  809  797   Hemoglobin A1c 4.8 - 5.6 % 5.1  - - 5.7  5.1   PH, Arterial 7.350 - 7.450 7.325  7.320  7.320  7.377  - - - -  PCO2 arterial 35.0 - 45.0 mmHg 41.5  42.2  38.9  42.5  - - - -  Bicarbonate 20.0 - 24.0 mEq/L 21.5  21.7  20.1  25.0  - - - -  TCO2 0 - 100 mmol/L 23  23  23  21  20  25  25  26  27  26  24  23   - - -  Acid-base deficit 0.0 - 2.0 mmol/L 4.0  4.0  6.0  - - - -  O2 Saturation % 91.0  97.0  88.0  100.0  - - - -    Details       Multiple values from  one day are sorted in reverse-chronological order         Capillary Blood Glucose: Lab Results  Component Value Date   GLUCAP 109 (H) 11/29/2015   GLUCAP 127 (H) 11/29/2015   GLUCAP 104 (H) 11/28/2015   GLUCAP 163 (H) 11/28/2015   GLUCAP 118 (H) 11/28/2015     Exercise Target Goals: Exercise Program Goal: Individual exercise prescription set using results from initial 6 min walk test and THRR while considering  patients activity barriers and safety.   Exercise Prescription Goal: Starting with aerobic activity 30 plus minutes a day, 3 days per week for initial exercise prescription. Provide home exercise prescription and guidelines that participant acknowledges understanding prior to discharge.  Activity Barriers & Risk Stratification:  Activity Barriers & Cardiac Risk Stratification - 05/27/24 0933       Activity Barriers & Cardiac Risk Stratification   Activity Barriers Shortness of Breath    Cardiac Risk Stratification High  6 Minute Walk:  6 Minute Walk     Row Name 05/28/24 1426         6 Minute Walk   Phase Initial     Distance 1500 feet     Walk Time 6 minutes     # of Rest Breaks 0     MPH 2.8     METS 3.4     RPE 11     Perceived Dyspnea  0     VO2 Peak 11.8     Symptoms No     Resting HR 57 bpm     Resting BP 120/80     Resting Oxygen Saturation  98 %     Exercise Oxygen Saturation  during 6 min walk 98 %     Max Ex. HR 58 bpm     Max Ex. BP 140/60     2 Minute Post BP 120/60        Oxygen Initial Assessment:   Oxygen Re-Evaluation:   Oxygen Discharge (Final Oxygen Re-Evaluation):   Initial Exercise Prescription:  Initial Exercise Prescription - 05/28/24 1400       Date of Initial Exercise RX and Referring Provider   Date 05/28/24    Referring Provider End, Lonni MD   Dr. Debera     Treadmill   MPH 2.4    Grade 0.5    Minutes 15    METs 3      REL-XR   Level 3    Speed 50    Minutes 15    METs 2.5       Prescription Details   Frequency (times per week) 3    Duration Progress to 30 minutes of continuous aerobic without signs/symptoms of physical distress      Intensity   THRR 40-80% of Max Heartrate 98-139    Ratings of Perceived Exertion 11-13    Perceived Dyspnea 0-4      Resistance Training   Training Prescription Yes    Weight 7    Reps 10-15          Perform Capillary Blood Glucose checks as needed.  Exercise Prescription Changes:   Exercise Prescription Changes     Row Name 05/28/24 1400             Response to Exercise   Blood Pressure (Admit) 120/80       Blood Pressure (Exercise) 140/60       Blood Pressure (Exit) 120/60       Heart Rate (Admit) 57 bpm       Heart Rate (Exercise) 85 bpm       Heart Rate (Exit) 68 bpm       Oxygen Saturation (Admit) 98 %       Oxygen Saturation (Exercise) 98 %       Oxygen Saturation (Exit) 98 %       Rating of Perceived Exertion (Exercise) 11       Perceived Dyspnea (Exercise) 0          Exercise Comments:   Exercise Comments     Row Name 06/03/24 0800           Exercise Comments First full day of exercise!  Patient was oriented to gym and equipment including functions, settings, policies, and procedures.  Patient's individual exercise prescription and treatment plan were reviewed.  All starting workloads were established based on the results of the 6 minute walk test done at initial orientation visit.  The  plan for exercise progression was also introduced and progression will be customized based on patient's performance and goals.          Exercise Goals and Review:   Exercise Goals     Row Name 05/28/24 1430             Exercise Goals   Increase Physical Activity Yes       Intervention Provide advice, education, support and counseling about physical activity/exercise needs.;Develop an individualized exercise prescription for aerobic and resistive training based on initial evaluation findings, risk  stratification, comorbidities and participant's personal goals.       Expected Outcomes Short Term: Attend rehab on a regular basis to increase amount of physical activity.;Long Term: Add in home exercise to make exercise part of routine and to increase amount of physical activity.;Long Term: Exercising regularly at least 3-5 days a week.       Increase Strength and Stamina Yes       Intervention Provide advice, education, support and counseling about physical activity/exercise needs.;Develop an individualized exercise prescription for aerobic and resistive training based on initial evaluation findings, risk stratification, comorbidities and participant's personal goals.       Expected Outcomes Short Term: Increase workloads from initial exercise prescription for resistance, speed, and METs.;Short Term: Perform resistance training exercises routinely during rehab and add in resistance training at home;Long Term: Improve cardiorespiratory fitness, muscular endurance and strength as measured by increased METs and functional capacity ( )       Able to understand and use rate of perceived exertion (RPE) scale Yes       Intervention Provide education and explanation on how to use RPE scale       Expected Outcomes Short Term: Able to use RPE daily in rehab to express subjective intensity level;Long Term:  Able to use RPE to guide intensity level when exercising independently       Able to understand and use Dyspnea scale Yes       Intervention Provide education and explanation on how to use Dyspnea scale       Expected Outcomes Short Term: Able to use Dyspnea scale daily in rehab to express subjective sense of shortness of breath during exertion;Long Term: Able to use Dyspnea scale to guide intensity level when exercising independently       Knowledge and understanding of Target Heart Rate Range (THRR) Yes       Intervention Provide education and explanation of THRR including how the numbers were predicted  and where they are located for reference       Expected Outcomes Short Term: Able to state/look up THRR;Long Term: Able to use THRR to govern intensity when exercising independently;Short Term: Able to use daily as guideline for intensity in rehab       Able to check pulse independently Yes       Intervention Provide education and demonstration on how to check pulse in carotid and radial arteries.;Review the importance of being able to check your own pulse for safety during independent exercise       Expected Outcomes Short Term: Able to explain why pulse checking is important during independent exercise;Long Term: Able to check pulse independently and accurately       Understanding of Exercise Prescription Yes       Intervention Provide education, explanation, and written materials on patient's individual exercise prescription       Expected Outcomes Short Term: Able to explain program exercise prescription;Long Term: Able to  explain home exercise prescription to exercise independently          Exercise Goals Re-Evaluation :  Exercise Goals Re-Evaluation     Row Name 06/03/24 0800             Exercise Goal Re-Evaluation   Exercise Goals Review Increase Physical Activity;Increase Strength and Stamina;Able to understand and use Dyspnea scale;Able to check pulse independently;Knowledge and understanding of Target Heart Rate Range (THRR);Able to understand and use rate of perceived exertion (RPE) scale;Understanding of Exercise Prescription       Comments Reviewed RPE and dyspnea scale, THR and program prescription with pt today.  Pt voiced understanding and was given a copy of goals to take home.       Expected Outcomes Short: Use RPE daily to regulate intensity.  Long: Follow program prescription in THR.           Discharge Exercise Prescription (Final Exercise Prescription Changes):  Exercise Prescription Changes - 05/28/24 1400       Response to Exercise   Blood Pressure (Admit)  120/80    Blood Pressure (Exercise) 140/60    Blood Pressure (Exit) 120/60    Heart Rate (Admit) 57 bpm    Heart Rate (Exercise) 85 bpm    Heart Rate (Exit) 68 bpm    Oxygen Saturation (Admit) 98 %    Oxygen Saturation (Exercise) 98 %    Oxygen Saturation (Exit) 98 %    Rating of Perceived Exertion (Exercise) 11    Perceived Dyspnea (Exercise) 0          Nutrition:  Target Goals: Understanding of nutrition guidelines, daily intake of sodium 1500mg , cholesterol 200mg , calories 30% from fat and 7% or less from saturated fats, daily to have 5 or more servings of fruits and vegetables.  Biometrics:  Pre Biometrics - 05/28/24 1431       Pre Biometrics   Height 6' (1.829 m)    Weight 245 lb 9.5 oz (111.4 kg)    Waist Circumference 45 inches    Hip Circumference 43 inches    Waist to Hip Ratio 1.05 %    BMI (Calculated) 33.3    Grip Strength 24.5 kg    Single Leg Stand 25.06 seconds           Nutrition Therapy Plan and Nutrition Goals:   Nutrition Assessments:  MEDIFICTS Score Key: >=70 Need to make dietary changes  40-70 Heart Healthy Diet <= 40 Therapeutic Level Cholesterol Diet  Flowsheet Row CARDIAC VIRTUAL BASED CARE from 05/27/2024 in [facility] CARDIAC REHABILITATION  Picture Your Plate Total Score on Admission 55   Picture Your Plate Scores: <59 Unhealthy dietary pattern with much room for improvement. 41-50 Dietary pattern unlikely to meet recommendations for good health and room for improvement. 51-60 More healthful dietary pattern, with some room for improvement.  >60 Healthy dietary pattern, although there may be some specific behaviors that could be improved.    Nutrition Goals Re-Evaluation:   Nutrition Goals Discharge (Final Nutrition Goals Re-Evaluation):   Psychosocial: Target Goals: Acknowledge presence or absence of significant depression and/or stress, maximize coping skills, provide positive support system. Participant is able to  verbalize types and ability to use techniques and skills needed for reducing stress and depression.  Initial Review & Psychosocial Screening:  Initial Psych Review & Screening - 05/27/24 1011       Initial Review   Current issues with None Identified      Family Dynamics   Good Support  System? Yes    Comments Patient's girl friend and daughter support him.      Barriers   Psychosocial barriers to participate in program The patient should benefit from training in stress management and relaxation.;There are no identifiable barriers or psychosocial needs.      Screening Interventions   Interventions To provide support and resources with identified psychosocial needs;Encouraged to exercise;Provide feedback about the scores to participant    Expected Outcomes Short Term goal: Utilizing psychosocial counselor, staff and physician to assist with identification of specific Stressors or current issues interfering with healing process. Setting desired goal for each stressor or current issue identified.;Long Term Goal: Stressors or current issues are controlled or eliminated.;Short Term goal: Identification and review with participant of any Quality of Life or Depression concerns found by scoring the questionnaire.;Long Term goal: The participant improves quality of Life and PHQ9 Scores as seen by post scores and/or verbalization of changes          Quality of Life Scores:  Quality of Life - 05/28/24 0708       Quality of Life   Select Quality of Life      Quality of Life Scores   Health/Function Pre 24.47 %    Socioeconomic Pre 26.25 %    Psych/Spiritual Pre 28.93 %    Family Pre 30 %    GLOBAL Pre 26.56 %         Scores of 19 and below usually indicate a poorer quality of life in these areas.  A difference of  2-3 points is a clinically meaningful difference.  A difference of 2-3 points in the total score of the Quality of Life Index has been associated with significant improvement in  overall quality of life, self-image, physical symptoms, and general health in studies assessing change in quality of life.  PHQ-9: Review Flowsheet  More data exists      05/28/2024 05/21/2024 01/19/2024 04/10/2023 10/07/2022  Depression screen PHQ 2/9  Decreased Interest 1 0 0 2 0 2  Down, Depressed, Hopeless 0 0 0 0 0 0  PHQ - 2 Score 1 0 0 2 0 2  Altered sleeping 3 3 0 - 3  Tired, decreased energy 3 3 3  - 2  Change in appetite 1 0 0 - 0  Feeling bad or failure about yourself  0 0 0 - 0  Trouble concentrating 2 0 0 - 2  Moving slowly or fidgety/restless 1 0 1 - 0  Suicidal thoughts 0 0 0 - 0  PHQ-9 Score 11 6 6   - 9   Difficult doing work/chores Somewhat difficult Not difficult at all - - -    Details       Data saved with a previous flowsheet row definition   Multiple values from one day are sorted in reverse-chronological order        Interpretation of Total Score  Total Score Depression Severity:  1-4 = Minimal depression, 5-9 = Mild depression, 10-14 = Moderate depression, 15-19 = Moderately severe depression, 20-27 = Severe depression   Psychosocial Evaluation and Intervention:  Psychosocial Evaluation - 05/27/24 1012       Psychosocial Evaluation & Interventions   Interventions Stress management education;Relaxation education;Encouraged to exercise with the program and follow exercise prescription    Comments Patient referred to cardiac rehab with NSTEMI. Hs had a CABG in 2017. He was in a regular exercise routine up until 2 years ago when he developed fatigue and SOB which he continues  to have. He denies any depression or anxiety. He sleeps well. His goals for the program are to lose some weigth; improve his fatigue and SOB and get back into an exercise routine. He has no barriers identified to complete the program.    Expected Outcomes Short Term: Patient will start the program and attend consistently. Long Term: Patient will complete the program meeting personal goals.     Continue Psychosocial Services  Follow up required by staff          Psychosocial Re-Evaluation:   Psychosocial Discharge (Final Psychosocial Re-Evaluation):   Vocational Rehabilitation: Provide vocational rehab assistance to qualifying candidates.   Vocational Rehab Evaluation & Intervention:  Vocational Rehab - 05/27/24 1006       Initial Vocational Rehab Evaluation & Intervention   Assessment shows need for Vocational Rehabilitation No      Vocational Rehab Re-Evaulation   Comments Working full time as a psychologist, sport and exercise.          Education: Education Goals: Education classes will be provided on a weekly basis, covering required topics. Participant will state understanding/return demonstration of topics presented.  Learning Barriers/Preferences:  Learning Barriers/Preferences - 05/27/24 1018       Learning Barriers/Preferences   Learning Barriers None    Learning Preferences Skilled Demonstration;Computer/Internet;Written Material;Audio          Education Topics: Hypertension, Hypertension Reduction -Define heart disease and high blood pressure. Discus how high blood pressure affects the body and ways to reduce high blood pressure.   Exercise and Your Heart -Discuss why it is important to exercise, the FITT principles of exercise, normal and abnormal responses to exercise, and how to exercise safely.   Angina -Discuss definition of angina, causes of angina, treatment of angina, and how to decrease risk of having angina.   Cardiac Medications -Review what the following cardiac medications are used for, how they affect the body, and side effects that may occur when taking the medications.  Medications include Aspirin , Beta blockers, calcium  channel blockers, ACE Inhibitors, angiotensin receptor blockers, diuretics, digoxin, and antihyperlipidemics.   Congestive Heart Failure -Discuss the definition of CHF, how to live with CHF, the signs and symptoms  of CHF, and how keep track of weight and sodium intake.   Heart Disease and Intimacy -Discus the effect sexual activity has on the heart, how changes occur during intimacy as we age, and safety during sexual activity.   Smoking Cessation / COPD -Discuss different methods to quit smoking, the health benefits of quitting smoking, and the definition of COPD.   Nutrition I: Fats -Discuss the types of cholesterol, what cholesterol does to the heart, and how cholesterol levels can be controlled.   Nutrition II: Labels -Discuss the different components of food labels and how to read food label   Heart Parts/Heart Disease and PAD -Discuss the anatomy of the heart, the pathway of blood circulation through the heart, and these are affected by heart disease.   Stress I: Signs and Symptoms -Discuss the causes of stress, how stress may lead to anxiety and depression, and ways to limit stress.   Stress II: Relaxation -Discuss different types of relaxation techniques to limit stress.   Warning Signs of Stroke / TIA -Discuss definition of a stroke, what the signs and symptoms are of a stroke, and how to identify when someone is having stroke.   Knowledge Questionnaire Score:  Knowledge Questionnaire Score - 05/28/24 0707       Knowledge Questionnaire Score   Pre  Score 23/26          Core Components/Risk Factors/Patient Goals at Admission:  Personal Goals and Risk Factors at Admission - 05/27/24 1006       Core Components/Risk Factors/Patient Goals on Admission    Weight Management Weight Maintenance    Improve shortness of breath with ADL's Yes    Intervention Provide education, individualized exercise plan and daily activity instruction to help decrease symptoms of SOB with activities of daily living.    Expected Outcomes Short Term: Improve cardiorespiratory fitness to achieve a reduction of symptoms when performing ADLs;Long Term: Be able to perform more ADLs without symptoms  or delay the onset of symptoms    Hypertension Yes    Intervention Provide education on lifestyle modifcations including regular physical activity/exercise, weight management, moderate sodium restriction and increased consumption of fresh fruit, vegetables, and low fat dairy, alcohol moderation, and smoking cessation.;Monitor prescription use compliance.    Expected Outcomes Short Term: Continued assessment and intervention until BP is < 140/91mm HG in hypertensive participants. < 130/29mm HG in hypertensive participants with diabetes, heart failure or chronic kidney disease.;Long Term: Maintenance of blood pressure at goal levels.    Lipids Yes    Intervention Provide education and support for participant on nutrition & aerobic/resistive exercise along with prescribed medications to achieve LDL 70mg , HDL >40mg .    Expected Outcomes Short Term: Participant states understanding of desired cholesterol values and is compliant with medications prescribed. Participant is following exercise prescription and nutrition guidelines.;Long Term: Cholesterol controlled with medications as prescribed, with individualized exercise RX and with personalized nutrition plan. Value goals: LDL < 70mg , HDL > 40 mg.          Core Components/Risk Factors/Patient Goals Review:    Core Components/Risk Factors/Patient Goals at Discharge (Final Review):    ITP Comments:  ITP Comments     Row Name 05/27/24 1023 05/28/24 1409 06/03/24 0800 06/04/24 1212     ITP Comments Virtual orientation visit completed for cardiac rehab with NSTEMI. On-site orientation visit scheduled for 05/28/24. Patient arrived for 1st visit/orientation/education at 1330. Patient was referred to CR by Dr. Lonni End/Dr. Debera attending due to NSTEMI. During orientation advised patient on arrival and appointment times what to wear, what to do before, during and after exercise. Reviewed attendance and class policy.  Pt is scheduled to return  Cardiac Rehab on 06/03/24 at 745. Pt was advised to come to class 15 minutes before class starts.  Discussed RPE/Dpysnea scales. Patient participated in warm up stretches. Patient was able to complete 6 minute walk test.  Telemetry:NSR. Patient was measured for the equipment. Discussed equipment safety with patient. Took patient pre-anthropometric measurements. Patient finished visit at 1420. First full day of exercise!  Patient was oriented to gym and equipment including functions, settings, policies, and procedures.  Patient's individual exercise prescription and treatment plan were reviewed.  All starting workloads were established based on the results of the 6 minute walk test done at initial orientation visit.  The plan for exercise progression was also introduced and progression will be customized based on patient's performance and goals. 30 day review completed. ITP sent to Dr. Dorn Ross, Medical Director of Cardiac Rehab. Continue with ITP unless changes are made by physician.    New to program, just attended first day of exercise yesterday.       Comments: 30 day review     [1]  Current Outpatient Medications:    aspirin  EC 81 MG EC tablet, Take 1  tablet (81 mg total) by mouth daily., Disp: , Rfl:    Cholecalciferol  (VITAMIN D ) 50 MCG (2000 UT) CAPS, Take 2,000 Units by mouth daily., Disp: , Rfl:    clopidogrel  (PLAVIX ) 75 MG tablet, Take 1 tablet (75 mg total) by mouth daily with breakfast., Disp: 90 tablet, Rfl: 3   isosorbide  mononitrate (IMDUR ) 60 MG 24 hr tablet, Take 1 tablet (60 mg total) by mouth daily., Disp: 90 tablet, Rfl: 3   metoprolol  succinate (TOPROL -XL) 25 MG 24 hr tablet, TAKE 1 TABLET BY MOUTH DAILY, Disp: 90 tablet, Rfl: 3   nitroGLYCERIN  (NITROSTAT ) 0.4 MG SL tablet, Place 1 tablet (0.4 mg total) under the tongue every 5 (five) minutes x 3 doses as needed for chest pain., Disp: 25 tablet, Rfl: 2   olmesartan  (BENICAR ) 40 MG tablet, Take 1 tablet (40 mg total) by  mouth daily., Disp: 30 tablet, Rfl: 11   pantoprazole  (PROTONIX ) 40 MG tablet, TAKE 1 TABLET BY MOUTH 2 TIMES A DAY BEFORE A MEAL, Disp: 60 tablet, Rfl: 5 [2]  Social History Tobacco Use  Smoking Status Never  Smokeless Tobacco Never

## 2024-06-05 ENCOUNTER — Encounter (HOSPITAL_COMMUNITY)
Admission: RE | Admit: 2024-06-05 | Discharge: 2024-06-05 | Disposition: A | Discharge status: Home / Self Care | Source: Ambulatory Visit | Attending: Cardiology | Admitting: Cardiology

## 2024-06-05 DIAGNOSIS — I214 Non-ST elevation (NSTEMI) myocardial infarction: Secondary | ICD-10-CM | POA: Diagnosis not present

## 2024-06-05 NOTE — Progress Notes (Signed)
 Daily Session Note  Patient Details  Name: Justin Villa MRN: [id number] Date of Birth: March 05, 1966 Referring Provider:   Flowsheet Row CARDIAC REHAB PHASE II ORIENTATION from 05/28/2024 in [facility] CARDIAC REHABILITATION  Referring Provider End, Lonni MD  [Dr. McDowell]    Encounter Date: 06/05/2024  Check In:  Session Check In - 06/05/24 0754       Check-In   Supervising physician immediately available to respond to emergencies See telemetry face sheet for immediately available MD    Location [facility]    Staff Present Powell Benders, BS, Exercise Physiologist;Brittany Jackquline, BSN, RN, Rosalba Gelineau, MA, RCEP, CCRP, CCET    Virtual Visit No    Medication changes reported     No    Fall or balance concerns reported    No    Tobacco Cessation No Change    Warm-up and Cool-down Performed on first and last piece of equipment    Resistance Training Performed Yes    VAD Patient? No    PAD/SET Patient? No      Pain Assessment   Currently in Pain? No/denies    Multiple Pain Sites No          Capillary Blood Glucose: No results found for this or any previous visit (from the past 24 hours).    Tobacco Use History[1]  Goals Met:  Independence with exercise equipment Exercise tolerated well No report of concerns or symptoms today Strength training completed today  Goals Unmet:  Not Applicable  Comments: Pt able to follow exercise prescription today without complaint.  Will continue to monitor for progression.        [1]  Social History Tobacco Use  Smoking Status Never  Smokeless Tobacco Never

## 2024-06-07 ENCOUNTER — Encounter (HOSPITAL_COMMUNITY)
Admission: RE | Admit: 2024-06-07 | Discharge: 2024-06-07 | Disposition: A | Discharge status: Home / Self Care | Source: Ambulatory Visit | Attending: Cardiology | Admitting: Cardiology

## 2024-06-07 DIAGNOSIS — I214 Non-ST elevation (NSTEMI) myocardial infarction: Secondary | ICD-10-CM | POA: Insufficient documentation

## 2024-06-07 NOTE — Progress Notes (Signed)
 Daily Session Note  Patient Details  Name: Justin Villa MRN: [id number] Date of Birth: Apr 28, 1966 Referring Provider:   Flowsheet Row CARDIAC REHAB PHASE II ORIENTATION from 05/28/2024 in [facility] CARDIAC REHABILITATION  Referring Provider End, Lonni MD  [Dr. McDowell]    Encounter Date: 06/07/2024  Check In:  Session Check In - 06/07/24 0809       Check-In   Supervising physician immediately available to respond to emergencies See telemetry face sheet for immediately available MD    Location [facility]    Staff Present Laymon Rattler, BSN, RN, WTA-C;Heather Con, BS, Exercise Physiologist    Virtual Visit No    Medication changes reported     No    Fall or balance concerns reported    No    Warm-up and Cool-down Performed on first and last piece of equipment    Resistance Training Performed Yes    VAD Patient? No    PAD/SET Patient? No      Pain Assessment   Currently in Pain? No/denies          Capillary Blood Glucose: No results found for this or any previous visit (from the past 24 hours).    Tobacco Use History[1]  Goals Met:  Independence with exercise equipment Exercise tolerated well No report of concerns or symptoms today Strength training completed today  Goals Unmet:  Not Applicable  Comments: Pt able to follow exercise prescription today without complaint.  Will continue to monitor for progression.        [1]  Social History Tobacco Use  Smoking Status Never  Smokeless Tobacco Never

## 2024-06-10 ENCOUNTER — Encounter (HOSPITAL_COMMUNITY)

## 2024-06-11 NOTE — Progress Notes (Signed)
 "  Cardiology Office Note    Date:  06/13/2024  ID:  Justin Villa, DOB July 10, 1965, MRN [id number] Cardiologist: Jayson Sierras, MD { : History of Present Illness:    Justin Villa is a 59 y.o. male with past medical history of CAD (s/p CABG in 2017 with LIMA-LAD, SVG-Diagonal, SVG-OM1-OM2 and SVG-PDA), HTN, HLD and prior CVA who presents to the office today for 68-month follow-up.   He was previously admitted for an NSTEMI in 04/2024 and cardiac catheterization at that time showed severe native vessel CAD and widely patent LIMA-LAD, patent SVG-OM1-OM2 and patent SVG-PDA with occluded SVG-Diagonal and medical therapy was recommended as he had no targets for PCI. Was recommended to be on DAPT with ASA and Plavix  for 12 months while being continued on Imdur  30 mg daily, Toprol -XL 25 mg daily and Olmesartan  40 mg daily. He had previously been intolerant to statins and was referred to the [facility]id [facility] for consideration of PCSK9 inhibitor therapy.  He did follow-up with Mardy Pizza, NP on 05/14/2024 and reported having worsening fatigue along with shortness of breath since the time of his recent MI. He had previously declined cardiac rehab but was now willing to participate and was referred to this. Imdur  was titrated to 60 mg daily and he was informed to follow-up in 4 to 6 weeks.  In talking the patient today, he reports having shortness of breath for over 2 years. He says that shortness of breath typically occurs with walking shorter distances and resolves with longer periods of exercise. He has been participating in cardiac rehab and denies any shortness of breath when exercising for extended periods. Reports occasional episodes of chest pain which occur at rest but no association with activity. No specific palpitations, orthopnea, PND or pitting edema. He was started on Imdur  following his hospitalization and this was titrated at the time of his last office visit but symptoms did not change with  this. He reports consuming 1-2 alcoholic beverages daily and no acute changes in intake. No prior tobacco use.  Studies Reviewed:   EKG: EKG is not ordered today.   Cardiac Catheterization: 04/2024   1st Diag lesion is 99% stenosed.   Mid LAD-1 lesion is 75% stenosed with 75% stenosed side branch in 2nd Diag.   Mid LAD-2 lesion is 100% stenosed.   Origin to Insertion lesion is 100% stenosed.   Ramus lesion is 90% stenosed.   Ost Cx to Dist Cx lesion is 100% stenosed.   Prox RCA to Dist RCA lesion is 100% stenosed.   Prox Graft lesion is 20% stenosed.   RPDA-2 lesion is 50% stenosed.   RPDA-1 lesion is 30% stenosed.   RPAV lesion is 70% stenosed.   2nd RPL lesion is 80% stenosed.   LIMA graft was visualized by angiography and is normal in caliber.   SVG graft was visualized by angiography.   Seq SVG- OM1 and OM2 graft was visualized by angiography and is large and anatomically normal.   SVG graft was visualized by angiography and is normal in caliber.   The left ventricular systolic function is normal.   LV end diastolic pressure is normal.   The left ventricular ejection fraction is 55-65% by visual estimate.   There is no aortic valve stenosis.   In the absence of any other complications or medical issues, we expect the patient to be ready for discharge from a cath perspective on 04/26/2024.   Recommend uninterrupted dual antiplatelet therapy with Aspirin  81mg   daily and Clopidogrel  75mg  daily for a minimum of 12 months (ACS-Class I recommendation).   Conclusions: Severe native coronary artery disease, as detailed below, including chronic total occlusions of mid LAD, ostial LCx, and proximal RCA, as well as significant small-vessel disease. Widely patent LIMA-LAD, SVG-OM1-OM2, and SVG-rPDA. Occluded SVG-diagonal. Normal left ventricular systolic function (LVEF 55-65%)  and filling pressure (LVEDP 9 mmHg).   Recommendations: Medical therapy and aggressive secondary prevention.  No  target for PCI. Will load with clopidogrel  300 mg with plans for dual antiplatelet therapy for up to 12 months.    Echocardiogram: 04/2024 IMPRESSIONS     1. Left ventricular ejection fraction, by estimation, is 60 to 65%. The  left ventricle has normal function. The left ventricle has no regional  wall motion abnormalities. There is mild left ventricular hypertrophy.  Left ventricular diastolic parameters  are consistent with Grade I diastolic dysfunction (impaired relaxation).  The average left ventricular global longitudinal strain is -17.8 %. The  global longitudinal strain is normal.   2. Right ventricular systolic function is normal. The right ventricular  size is normal.   3. The mitral valve is abnormal. Trivial mitral valve regurgitation. No  evidence of mitral stenosis.   4. The aortic valve is tricuspid. There is mild calcification of the  aortic valve. There is mild thickening of the aortic valve. Aortic valve  regurgitation is trivial. Aortic valve sclerosis is present, with no  evidence of aortic valve stenosis.   5. Aortic dilatation noted. There is mild dilatation of the ascending  aorta, measuring 38 mm.   6. The inferior vena cava is normal in size with greater than 50%  respiratory variability, suggesting right atrial pressure of 3 mmHg.    Physical Exam:   VS:  BP 110/62 (BP Location: Left Arm, Cuff Size: Large)   Pulse 65   Ht 6' (1.829 m)   Wt 244 lb 12.8 oz (111 kg)   SpO2 97%   BMI 33.20 kg/m    Wt Readings from Last 3 Encounters:  06/12/24 244 lb 12.8 oz (111 kg)  05/28/24 245 lb 9.5 oz (111.4 kg)  05/21/24 245 lb (111.1 kg)     GEN: Well nourished, well developed male appearing in no acute distress NECK: No JVD; No carotid bruits CARDIAC: RRR, no murmurs, rubs, gallops RESPIRATORY:  Clear to auscultation without rales, wheezing or rhonchi  ABDOMEN: Appears non-distended. No obvious abdominal masses. EXTREMITIES: No clubbing or cyanosis. No  pitting edema.  Distal pedal pulses are 2+ bilaterally.   Assessment and Plan:   1. Coronary artery disease involving coronary bypass graft of native heart with other forms of angina/Dyspnea on Exertion - He previously underwent CABG in 2017 with LIMA-LAD, SVG-Diagonal, SVG-OM1-OM2 and SVG-PDA. Recent cardiac catheterization showed patent LIMA-LAD, patent SVG-OM1-OM2 and patent SVG-PDA with occluded SVG-Diagonal and medical therapy was recommended as he had no targets for PCI. As discussed above, he has shortness of breath with short distances but this improves with exercise and walking for longer periods of time. I suspect that symptoms are due to his occluded graft and likely collateral flow. We reviewed options and will try Ranexa  500 mg twice daily to see if this helps his symptoms. If no improvement within 2 to 3 weeks, we reviewed that he could stop the medication. If symptoms persist, would consider repeat PFT's for full workup as most recent assessment on file was in 11/2015.   - Continue ASA 81 mg daily, Plavix  75 mg daily, Imdur   60 mg daily and Toprol -XL 25 mg daily. He does report fatigue and we reviewed that he could try taking Toprol -XL and Olmesartan  at night to see if this helps with symptoms.    2. Essential hypertension - BP is well-controlled at 110/62 during today's visit. Continue current medical therapy with Imdur  60 mg daily, Toprol -XL 25 mg daily and Olmesartan  40 mg daily.  3. Mixed hyperlipidemia - FLP in 04/2024 showed total cholesterol 165, triglycerides 202, HDL 28 and LDL 97. LP(a) was normal at 10.6. He has been intolerant to multiple statins due to myalgias and has been referred to the Pharm.D. clinic with a follow-up visit later this month.  Signed, Laymon CHRISTELLA Qua, PA-C   "

## 2024-06-12 ENCOUNTER — Encounter (HOSPITAL_COMMUNITY)
Admission: RE | Admit: 2024-06-12 | Discharge: 2024-06-12 | Disposition: A | Discharge status: Home / Self Care | Source: Ambulatory Visit | Attending: Cardiology | Admitting: Cardiology

## 2024-06-12 ENCOUNTER — Encounter: Payer: Self-pay | Admitting: Student

## 2024-06-12 ENCOUNTER — Ambulatory Visit: Attending: Student | Admitting: Student

## 2024-06-12 VITALS — BP 110/62 | HR 65 | Ht 72.0 in | Wt 244.8 lb

## 2024-06-12 DIAGNOSIS — I214 Non-ST elevation (NSTEMI) myocardial infarction: Secondary | ICD-10-CM

## 2024-06-12 DIAGNOSIS — E782 Mixed hyperlipidemia: Secondary | ICD-10-CM

## 2024-06-12 DIAGNOSIS — R0609 Other forms of dyspnea: Secondary | ICD-10-CM

## 2024-06-12 DIAGNOSIS — I10 Essential (primary) hypertension: Secondary | ICD-10-CM | POA: Diagnosis not present

## 2024-06-12 DIAGNOSIS — I25708 Atherosclerosis of coronary artery bypass graft(s), unspecified, with other forms of angina pectoris: Secondary | ICD-10-CM

## 2024-06-12 MED ORDER — RANOLAZINE ER 500 MG PO TB12: 500.0000 mg | ORAL_TABLET | Freq: Two times a day (BID) | ORAL | 1 refills | Status: AC

## 2024-06-12 NOTE — Patient Instructions (Addendum)
 Medication Instructions:   Start taking Olmesartan  and Toprol -XL at night as this should help with fatigue.   Start Ranexa  500mg  twice daily. If no improvement in symptoms in 2-3 weeks, please make us  aware.    *If you need a refill on your cardiac medications before your next appointment, please call your pharmacy*  Follow-Up: At [facility], you and your health needs are our priority.  As part of our continuing mission to provide you with exceptional heart care, our providers are all part of one team.  This team includes your primary Cardiologist (physician) and Advanced Practice Providers or APPs (Physician Assistants and Nurse Practitioners) who all work together to provide you with the care you need, when you need it.  Your next appointment:   2 months  Provider:   You may see Jayson Sierras, MD or one of the following Advanced Practice Providers on your designated Care Team:   Laymon Qua, PA-C  [place], [place] Olivia [place], [place]     We recommend signing up for the patient portal called MyChart.  Sign up information is provided on this After Visit Summary.  MyChart is used to connect with patients for Virtual Visits (Telemedicine).  Patients are able to view lab/test results, encounter notes, upcoming appointments, etc.  Non-urgent messages can be sent to your provider as well.   To learn more about what you can do with MyChart, go to [email or web address].

## 2024-06-12 NOTE — Progress Notes (Signed)
 Daily Session Note  Patient Details  Name: Justin Villa MRN: [id number] Date of Birth: [birth year] Referring Provider:   Flowsheet Row CARDIAC REHAB PHASE II ORIENTATION from 05/28/2024 in [facility] CARDIAC REHABILITATION  Referring Provider End, Lonni MD  [Dr. McDowell]    Encounter Date: 06/12/2024  Check In:  Session Check In - 06/12/24 0803       Check-In   Supervising physician immediately available to respond to emergencies See telemetry face sheet for immediately available MD    Location [facility]    Staff Present Powell Benders, BS, Exercise Physiologist;Brittany Jackquline, BSN, RN, Rosalba Gelineau, MA, RCEP, CCRP, CCET    Virtual Visit No    Medication changes reported     No    Fall or balance concerns reported    No    Tobacco Cessation No Change    Warm-up and Cool-down Performed on first and last piece of equipment    Resistance Training Performed Yes    VAD Patient? No    PAD/SET Patient? No      Pain Assessment   Currently in Pain? No/denies    Multiple Pain Sites No          Capillary Blood Glucose: No results found for this or any previous visit (from the past 24 hours).    Tobacco Use History[1]  Goals Met:  Independence with exercise equipment Exercise tolerated well No report of concerns or symptoms today Strength training completed today  Goals Unmet:  Not Applicable  Comments: Pt able to follow exercise prescription today without complaint.  Will continue to monitor for progression.        [1]  Social History Tobacco Use  Smoking Status Never  Smokeless Tobacco Never

## 2024-06-13 ENCOUNTER — Encounter: Payer: Self-pay | Admitting: Student

## 2024-06-14 ENCOUNTER — Encounter (HOSPITAL_COMMUNITY)
Admission: RE | Admit: 2024-06-14 | Discharge: 2024-06-14 | Disposition: A | Source: Ambulatory Visit | Attending: Cardiology

## 2024-06-14 DIAGNOSIS — I214 Non-ST elevation (NSTEMI) myocardial infarction: Secondary | ICD-10-CM

## 2024-06-14 NOTE — Progress Notes (Signed)
 Daily Session Note  Patient Details  Name: Justin Villa MRN: [id number] Date of Birth: 04/25/1966 Referring Provider:   Flowsheet Row CARDIAC REHAB PHASE II ORIENTATION from 05/28/2024 in [facility] CARDIAC REHABILITATION  Referring Provider End, Lonni MD  [Dr. McDowell]    Encounter Date: 06/14/2024  Check In:  Session Check In - 06/14/24 0807       Check-In   Supervising physician immediately available to respond to emergencies See telemetry face sheet for immediately available MD    Location [facility]    Staff Present Powell Benders, BS, Exercise Physiologist;Debra Vicci, RN, Randye Gelineau, MA, RCEP, CCRP, CCET    Virtual Visit No    Medication changes reported     No    Fall or balance concerns reported    No    Warm-up and Cool-down Performed on first and last piece of equipment    Resistance Training Performed Yes    VAD Patient? No    PAD/SET Patient? No      Pain Assessment   Currently in Pain? No/denies          Capillary Blood Glucose: No results found for this or any previous visit (from the past 24 hours).    Tobacco Use History[1]  Goals Met:  Independence with exercise equipment Exercise tolerated well No report of concerns or symptoms today Strength training completed today  Goals Unmet:  Not Applicable  Comments: Pt able to follow exercise prescription today without complaint.  Will continue to monitor for progression.        [1]  Social History Tobacco Use  Smoking Status Never  Smokeless Tobacco Never

## 2024-06-17 ENCOUNTER — Encounter (HOSPITAL_COMMUNITY)

## 2024-06-17 ENCOUNTER — Encounter (HOSPITAL_COMMUNITY): Payer: Self-pay

## 2024-06-19 ENCOUNTER — Encounter (HOSPITAL_COMMUNITY)
Admission: RE | Admit: 2024-06-19 | Discharge: 2024-06-19 | Disposition: A | Discharge status: Home / Self Care | Source: Ambulatory Visit | Attending: Cardiology | Admitting: Cardiology

## 2024-06-19 DIAGNOSIS — I214 Non-ST elevation (NSTEMI) myocardial infarction: Secondary | ICD-10-CM | POA: Diagnosis not present

## 2024-06-19 NOTE — Progress Notes (Signed)
 Daily Session Note  Patient Details  Name: Justin Villa MRN: [id number] Date of Birth: November 29, 1965 Referring Provider:   Flowsheet Row CARDIAC REHAB PHASE II ORIENTATION from 05/28/2024 in [facility] CARDIAC REHABILITATION  Referring Provider End, Lonni MD  [Dr. McDowell]    Encounter Date: 06/19/2024  Check In:  Session Check In - 06/19/24 0801       Check-In   Supervising physician immediately available to respond to emergencies See telemetry face sheet for immediately available MD    Location [facility]    Staff Present Powell Benders, BS, Exercise Physiologist;Danessa Mensch Jackquline, BSN, RN, WTA-C    Virtual Visit No    Medication changes reported     No    Fall or balance concerns reported    No    Tobacco Cessation No Change    Warm-up and Cool-down Performed on first and last piece of equipment    Resistance Training Performed Yes    VAD Patient? No    PAD/SET Patient? No      Pain Assessment   Currently in Pain? No/denies          Capillary Blood Glucose: No results found for this or any previous visit (from the past 24 hours).    Tobacco Use History[1]  Goals Met:  Independence with exercise equipment Exercise tolerated well No report of concerns or symptoms today Strength training completed today  Goals Unmet:  Not Applicable  Comments: Pt able to follow exercise prescription today without complaint.  Will continue to monitor for progression.        [1]  Social History Tobacco Use  Smoking Status Never  Smokeless Tobacco Never

## 2024-06-21 ENCOUNTER — Encounter (HOSPITAL_COMMUNITY)
Admission: RE | Admit: 2024-06-21 | Discharge: 2024-06-21 | Disposition: A | Discharge status: Home / Self Care | Source: Ambulatory Visit | Attending: Cardiology | Admitting: Cardiology

## 2024-06-21 DIAGNOSIS — I214 Non-ST elevation (NSTEMI) myocardial infarction: Secondary | ICD-10-CM | POA: Diagnosis not present

## 2024-06-21 NOTE — Progress Notes (Signed)
 Daily Session Note  Patient Details  Name: Justin Villa MRN: [id number] Date of Birth: March 27, 1966 Referring Provider:   Flowsheet Row CARDIAC REHAB PHASE II ORIENTATION from 05/28/2024 in [facility] CARDIAC REHABILITATION  Referring Provider End, Lonni MD  [Dr. McDowell]    Encounter Date: 06/21/2024  Check In:  Session Check In - 06/21/24 0753       Check-In   Supervising physician immediately available to respond to emergencies See telemetry face sheet for immediately available MD    Location [facility]    Staff Present Laymon Rattler, BSN, RN, Rosalba Gelineau, MA, RCEP, CCRP, CCET    Virtual Visit No    Medication changes reported     No    Fall or balance concerns reported    No    Tobacco Cessation No Change    Warm-up and Cool-down Performed on first and last piece of equipment    Resistance Training Performed Yes    VAD Patient? No    PAD/SET Patient? No      Pain Assessment   Currently in Pain? No/denies          Capillary Blood Glucose: No results found for this or any previous visit (from the past 24 hours).    Tobacco Use History[1]  Goals Met:  Independence with exercise equipment Exercise tolerated well No report of concerns or symptoms today Strength training completed today  Goals Unmet:  Not Applicable  Comments: Pt able to follow exercise prescription today without complaint.  Will continue to monitor for progression.        [1]  Social History Tobacco Use  Smoking Status Never  Smokeless Tobacco Never

## 2024-06-24 ENCOUNTER — Encounter (HOSPITAL_COMMUNITY)

## 2024-06-25 ENCOUNTER — Encounter: Payer: Self-pay | Admitting: Pharmacist Clinician (PhC)/ Clinical Pharmacy Specialist

## 2024-06-25 ENCOUNTER — Ambulatory Visit: Attending: Cardiology | Admitting: Pharmacist Clinician (PhC)/ Clinical Pharmacy Specialist

## 2024-06-25 ENCOUNTER — Other Ambulatory Visit (HOSPITAL_COMMUNITY): Payer: Self-pay

## 2024-06-25 DIAGNOSIS — E782 Mixed hyperlipidemia: Secondary | ICD-10-CM

## 2024-06-25 MED ORDER — ROSUVASTATIN CALCIUM 10 MG PO TABS
10.0000 mg | ORAL_TABLET | Freq: Every day | ORAL | 3 refills | Status: AC
  Filled 2024-06-25: qty 30, 30d supply, fill #0

## 2024-06-25 MED ORDER — EZETIMIBE 10 MG PO TABS
10.0000 mg | ORAL_TABLET | Freq: Every day | ORAL | 3 refills | Status: AC
  Filled 2024-06-25: qty 30, 30d supply, fill #0

## 2024-06-25 NOTE — Assessment & Plan Note (Signed)
 Assessment: Patient with ASCVD not at LDL goal of < 70 Most recent LDL 97 on 06/27/23 Not able to tolerate atorvastatin  secondary to myalgias Reviewed options for lowering LDL cholesterol, including second statin, ezetimibe , PCSK-9 inhibitors, bempedoic acid and inclisiran.  Discussed mechanisms of action, dosing, side effects, potential decreases in LDL cholesterol and costs.  Also reviewed potential options for patient assistance.  Plan: Patient agreeable to starting rosuvastatin  10 mg daily and ezetimibe  10 mg daily Repeat labs after:  3 months Lipid Liver function

## 2024-06-25 NOTE — Patient Instructions (Signed)
 Your Results:             Your most recent labs Goal  Total Cholesterol 165 < 200  Triglycerides 202 < 150  HDL (happy/good cholesterol) 28 > 40  LDL (lousy/bad cholesterol 97 < 70   Medication changes:  START ROSUVASTATIN  10 MG ONCE DAILY  START EZETIMIBE  10 MG ONCE DAILY   If you develop any muscle/joint aches with the rosuvastatin , please stop taking and reach out to me.  We can then get your insurance to cover Repatha injections  Lab orders:  If you tolerate the rosuvastatin  and ezetimibe , we will want to repeat labs after 2-3 months.  I will message you in MyChart to let you know when you need to get labs drawn.   Thank you for choosing [facility]   High Triglycerides Eating Plan Triglycerides are a type of fat in the blood. High levels of triglycerides can increase your risk of heart disease and stroke. If your triglyceride levels are high, choosing the right foods can help lower your triglycerides and keep your heart healthy. Work with your health care provider or a dietitian to develop an eating plan that is right for you. What are tips for following this plan? General guidelines  Lose weight, if you are overweight. For most people, losing 5-10 lb (2-5 kg) helps lower triglyceride levels. A weight-loss plan may include: 30 minutes of exercise at least 5 days a week. Reducing the amount of calories, sugar, and fat you eat. Eat a wide variety of fresh fruits, vegetables, and whole grains. These foods are high in fiber. Eat foods that contain healthy fats, such as fatty fish, nuts, seeds, and olive oil. Avoid foods that are high in added sugar, added salt (sodium), and saturated fat. Avoid low-fiber, refined carbohydrates such as white bread, crackers, noodles, and white rice. Avoid foods with trans fats or partially hydrogenated oils, such as fried foods or stick margarine. If you drink alcohol: Limit how much you have to: 0-1 drink a day for women who are not  pregnant. 0-2 drinks a day for men. Your health care provider may recommend that you drink less than these amounts depending on your overall health. Know how much alcohol is in a drink. In the [place] one drink equals one 12 oz bottle of beer (355 mL), one 5 oz glass of wine (148 mL), or one 1 oz glass of hard liquor (44 mL). Reading food labels Check food labels for: The amount of saturated fat. Choose foods with no or very little saturated fat (less than 2 g). The amount of trans fat. Choose foods with no transfat. The amount of cholesterol. Choose foods that are low in cholesterol. The amount of sodium. Choose foods with less than 140 milligrams (mg) per serving. Shopping Buy dairy products labeled as nonfat (skim) or low-fat (1%). Avoid buying processed or prepackaged foods. These are often high in added sugar, sodium, and fat. Cooking Choose healthy fats when cooking, such as olive oil, avocado oil, or canola oil. Cook foods using lower fat methods, such as baking, broiling, boiling, or grilling. Make your own sauces, dressings, and marinades when possible, instead of buying them. Store-bought sauces, dressings, and marinades are often high in sodium and sugar. Meal planning Eat more home-cooked food and less restaurant, buffet, and fast food. Eat fatty fish at least 2 times each week. Examples of fatty fish include salmon, trout, sardines, mackerel, tuna, and herring. If you eat whole eggs, do not eat  more than 4 egg yolks per week.  What foods should I eat? Fruits All fresh, canned (in natural juice), or frozen fruits. Vegetables Fresh or frozen vegetables. Low-sodium canned vegetables. Grains Whole wheat or whole grain breads, crackers, cereals, and pasta. Unsweetened oatmeal. Bulgur. Barley. Quinoa. Brown rice. Whole wheat flour tortillas. Meats and other proteins Skinless chicken or turkey. Ground chicken or turkey. Lean cuts of pork, trimmed of fat. Fish and seafood,  especially salmon, trout, and herring. Egg whites. Dried beans, peas, or lentils. Unsalted nuts or seeds. Unsalted canned beans. Natural peanut or almond butter or other nut butters. Dairy Low-fat dairy products. Skim or low-fat (1%) milk. Reduced fat (2%) and low-sodium cheese. Low-fat ricotta cheese. Low-fat cottage cheese. Plain, low-fat yogurt. Fats and oils Tub margarine without trans fats. Light or reduced-fat mayonnaise. Light or reduced-fat salad dressings. Avocado. Safflower, olive, sunflower, soybean, and canola oils. The items listed above may not be a complete list of recommended foods and beverages. Talk with your dietitian about what dietary choices are best for you.  What foods should I avoid? Fruits Sweetened dried fruit. Canned fruit in syrup. Fruit juice. Vegetables Creamed or fried vegetables. Vegetables in a cheese sauce. Grains White bread. White (regular) pasta. White rice. Cornbread. Bagels. Pastries. Crackers that contain trans fat. Meats and other proteins Fatty cuts of meat. Ribs. Chicken wings. [place]. Sausage. Bologna. Salami. Chitterlings. Fatback. Hot dogs. Bratwurst. Packaged lunch meats. Dairy Whole or reduced-fat (2%) milk. Half-and-half. Cream cheese. Full-fat or sweetened yogurt. Full-fat cheese. Nondairy creamers. Whipped toppings. Processed cheese or cheese spreads. Cheese curds. Fats and oils Butter. Stick margarine. Lard. Shortening. Ghee. Bacon fat. Tropical oils, such as coconut, palm kernel, or palm oils. Beverages Alcohol. Sweetened drinks, such as soda, lemonade, fruit drinks, or punches. Sweets and desserts Corn syrup. Sugars. Honey. Molasses. Candy. Jam and jelly. Syrup. Sweetened cereals. Cookies. Pies. Cakes. Donuts. Muffins. Ice cream. Condiments Store-bought sauces, dressings, and marinades that are high in sugar, such as ketchup and barbecue sauce. The items listed above may not be a complete list of foods and beverages you should avoid.  Talk with your dietitian about what dietary choices are best for you. Summary High levels of triglycerides can increase the risk of heart disease and stroke. Choosing the right foods can help lower your triglycerides. Eat plenty of fresh fruits, vegetables, and whole grains. Choose low-fat dairy and lean meats. Eat fatty fish at least twice a week. Avoid processed and prepackaged foods with added sugar, sodium, saturated fat, and trans fat. If you need suggestions or have questions about what types of food are good for you, talk with your health care provider or a dietitian. This information is not intended to replace advice given to you by your health care provider. Make sure you discuss any questions you have with your health care provider. Document Revised: 10/02/2020 Document Reviewed: 10/02/2020 Elsevier Patient Education  2024 [employer].

## 2024-06-25 NOTE — Progress Notes (Signed)
 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 male patient of Dr Sierras, in the office today to discuss options for cholesterol management.  He was on atorvastatin  for several years (moderate intensity), but ultimately had to discontinue because of myalgias.  Has not tried other statins.    Insurance Carrier: [employer]    LDL Cholesterol goal:  LDL < 70  Current Medications: none    Previously tried:  atorvastatin  - myalgias  Family Hx:   CABG in dad, uncle, brother and sister; mother had CEA  Social Hx: Tobacco: no Alcohol:   2-3 drinks per week   Diet:  eating out some, but now taking lunch to work - chicken, sweet potatoes; eating more Mediterranean at home    Exercise: three days a week - cardiac rehab, and goes to gym as well   Accessory Clinical Findings   Lab Results  Component Value Date   CHOL 165 04/26/2024   HDL 28 (L) 04/26/2024   LDLCALC 97 04/26/2024   TRIG 202 (H) 04/26/2024   CHOLHDL 5.9 04/26/2024    Lipoprotein (a)  Date/Time Value Ref Range Status  04/26/2024 04:30 AM 10.6 <75.0 nmol/L Final    Comment:    (NOTE) This test was developed and its performance characteristics determined by [facility]. It has not been cleared or approved by the Food and Drug Administration. Note:  Values greater than or equal to 75.0 nmol/L may       indicate an independent risk factor for CHD,       but must be evaluated with caution when applied       to non-Caucasian  populations due to the       influence of genetic factors on Lp(a) across       ethnicities. Performed At: [facility] [address] [place], [place] [id number] Jennette Shorter MD Ey:[id number]     Lab Results  Component Value Date   ALT 51 (H) 04/25/2024   AST 55 (H) 04/25/2024   ALKPHOS 49 04/25/2024   BILITOT 1.9 (H) 04/25/2024   Lab Results  Component Value Date   CREATININE 1.13 04/26/2024   BUN 14 04/26/2024   NA 136 04/26/2024   K 3.9 04/26/2024   CL 103 04/26/2024   CO2 19 (L) 04/26/2024   Lab Results  Component Value Date   HGBA1C 5.1 04/26/2024    Home Medications    Current Outpatient Medications  Medication Sig Dispense Refill   ezetimibe  (ZETIA ) 10 MG tablet Take 1 tablet (10 mg total) by mouth daily. 30 tablet 3   rosuvastatin  (CRESTOR ) 10 MG tablet Take 1 tablet (10 mg total) by mouth daily. 30 tablet 3   aspirin  EC 81 MG EC tablet Take 1 tablet (81 mg total) by mouth daily.     Cholecalciferol  (VITAMIN D ) 50 MCG (2000 UT) CAPS Take 2,000 Units by mouth daily.     clopidogrel  (PLAVIX ) 75 MG tablet Take 1 tablet (75  mg total) by mouth daily with breakfast. 90 tablet 3   isosorbide  mononitrate (IMDUR ) 60 MG 24 hr tablet Take 1 tablet (60 mg total) by mouth daily. 90 tablet 3   metoprolol  succinate (TOPROL -XL) 25 MG 24 hr tablet TAKE 1 TABLET BY MOUTH DAILY 90 tablet 3   nitroGLYCERIN  (NITROSTAT ) 0.4 MG SL tablet Place 1 tablet (0.4 mg total) under the tongue every 5 (five) minutes x 3 doses as needed for chest pain. 25 tablet 2   olmesartan  (BENICAR ) 40 MG tablet Take 1 tablet (40 mg total) by mouth daily. 30 tablet 11   pantoprazole  (PROTONIX ) 40 MG tablet TAKE 1 TABLET BY MOUTH 2 TIMES A DAY BEFORE A MEAL 60 tablet 5   ranolazine  (RANEXA ) 500 MG 12 hr tablet Take 1 tablet (500 mg total) by mouth 2 (two) times daily. 60 tablet 1   No current facility-administered medications for this visit.     Assessment & Plan    HLD  (hyperlipidemia) Assessment: Patient with ASCVD not at LDL goal of < 70 Most recent LDL 97 on 06/27/23 Not able to tolerate atorvastatin  secondary to myalgias Reviewed options for lowering LDL cholesterol, including second statin, ezetimibe , PCSK-9 inhibitors, bempedoic acid and inclisiran.  Discussed mechanisms of action, dosing, side effects, potential decreases in LDL cholesterol and costs.  Also reviewed potential options for patient assistance.  Plan: Patient agreeable to starting rosuvastatin  10 mg daily and ezetimibe  10 mg daily Repeat labs after:  3 months Lipid Liver function    Allean Mink, PharmD CPP [facility] [address]   [place], [place] [address] [phone]  06/25/2024, 4:34 PM       [1] No Known Allergies  "

## 2024-06-26 ENCOUNTER — Encounter (HOSPITAL_COMMUNITY)
Admission: RE | Admit: 2024-06-26 | Discharge: 2024-06-26 | Disposition: A | Source: Ambulatory Visit | Attending: Cardiology

## 2024-06-26 DIAGNOSIS — I214 Non-ST elevation (NSTEMI) myocardial infarction: Secondary | ICD-10-CM

## 2024-06-26 NOTE — Progress Notes (Signed)
 Daily Session Note  Patient Details  Name: [place] MRN: [id number] Date of Birth: Nov 27, 1965 Referring Provider:   Flowsheet Row CARDIAC REHAB PHASE II ORIENTATION from 05/28/2024 in [facility] CARDIAC REHABILITATION  Referring Provider End, Lonni MD  [Dr. McDowell]    Encounter Date: 06/26/2024  Check In:  Session Check In - 06/26/24 0745       Check-In   Supervising physician immediately available to respond to emergencies See telemetry face sheet for immediately available MD    Staff Present Adrien Louder, RN, BSN;Victoria Zina, RN;Heather Con, BS, Exercise Physiologist    Virtual Visit No    Medication changes reported     No    Fall or balance concerns reported    No    Warm-up and Cool-down Performed on first and last piece of equipment    Resistance Training Performed Yes    VAD Patient? No    PAD/SET Patient? No      Pain Assessment   Currently in Pain? No/denies    Pain Score 0-No pain    Multiple Pain Sites No          Capillary Blood Glucose: No results found for this or any previous visit (from the past 24 hours).    Tobacco Use History[1]  Goals Met:  Independence with exercise equipment Exercise tolerated well No report of concerns or symptoms today Strength training completed today  Goals Unmet:  Not Applicable  Comments: Pt able to follow exercise prescription today without complaint.  Will continue to monitor for progression.        [1]  Social History Tobacco Use  Smoking Status Never  Smokeless Tobacco Never

## 2024-06-28 ENCOUNTER — Encounter (HOSPITAL_COMMUNITY)

## 2024-07-01 ENCOUNTER — Encounter (HOSPITAL_COMMUNITY)

## 2024-07-02 ENCOUNTER — Encounter (HOSPITAL_COMMUNITY): Payer: Self-pay | Admitting: *Deleted

## 2024-07-02 DIAGNOSIS — I214 Non-ST elevation (NSTEMI) myocardial infarction: Secondary | ICD-10-CM

## 2024-07-02 NOTE — Progress Notes (Signed)
 Cardiac Individual Treatment Plan  Patient Details  Name: Justin Villa MRN: [id number] Date of Birth: 17-Jun-1965 Referring Provider:   Flowsheet Row CARDIAC REHAB PHASE II ORIENTATION from 05/28/2024 in [facility] CARDIAC REHABILITATION  Referring Provider End, Lonni MD  [Dr. McDowell]    Initial Encounter Date:  Flowsheet Row CARDIAC REHAB PHASE II ORIENTATION from 05/28/2024 in [facility] CARDIAC REHABILITATION  Date 05/28/24    Visit Diagnosis: NSTEMI (non-ST elevated myocardial infarction) [facility])  Patient's Home Medications on Admission: Current Medications[1]  Past Medical History: Past Medical History:  Diagnosis Date   CAD (coronary artery disease) 11/27/2015   a. S/p NSTEMI 6/17 >> s/p CABG // b. LHC 6/17: oLAD 50, mLAD 100, D1 70, D2 95, oLCx 95, mLCx 65, OM1 85, OM2 90, mRCA 85, dRCA 75, RPDA 40/50, RPLB2 50    Carotid stenosis    a. Carotid US  6/17: bilat ICA 1-39%   Essential hypertension    GERD (gastroesophageal reflux disease)    History of non-ST elevation myocardial infarction (NSTEMI) 11/2015   Hyperlipidemia    Stroke (HCC)     Tobacco Use: Tobacco Use History[2]  Labs: Review Flowsheet  More data exists      Latest Ref Rng & Units 11/27/2015 11/28/2015 10/22/2019 06/17/2022 04/26/2024  Labs for ITP Cardiac and Pulmonary Rehab  Cholestrol 0 - 200 mg/dL - - 849  843  834   LDL (calc) 0 - 99 mg/dL - - 90  90  97   HDL-C >40 mg/dL - - 28  33  28   Trlycerides <150 mg/dL - - 840  809  797   Hemoglobin A1c 4.8 - 5.6 % 5.1  - - 5.7  5.1   PH, Arterial 7.350 - 7.450 7.325  7.320  7.320  7.377  - - - -  PCO2 arterial 35.0 - 45.0 mmHg 41.5  42.2  38.9  42.5  - - - -  Bicarbonate 20.0 - 24.0 mEq/L 21.5  21.7  20.1  25.0  - - - -  TCO2 0 - 100 mmol/L 23  23  23  21  20  25  25  26  27  26  24  23   - - -  Acid-base deficit 0.0 - 2.0 mmol/L 4.0  4.0  6.0  - - - -  O2 Saturation % 91.0  97.0  88.0  100.0  - - - -    Details       Multiple values from  one day are sorted in reverse-chronological order         Capillary Blood Glucose: Lab Results  Component Value Date   GLUCAP 109 (H) 11/29/2015   GLUCAP 127 (H) 11/29/2015   GLUCAP 104 (H) 11/28/2015   GLUCAP 163 (H) 11/28/2015   GLUCAP 118 (H) 11/28/2015     Exercise Target Goals: Exercise Program Goal: Individual exercise prescription set using results from initial 6 min walk test and THRR while considering  patients activity barriers and safety.   Exercise Prescription Goal: Starting with aerobic activity 30 plus minutes a day, 3 days per week for initial exercise prescription. Provide home exercise prescription and guidelines that participant acknowledges understanding prior to discharge.  Activity Barriers & Risk Stratification:  Activity Barriers & Cardiac Risk Stratification - 05/27/24 0933       Activity Barriers & Cardiac Risk Stratification   Activity Barriers Shortness of Breath    Cardiac Risk Stratification High  6 Minute Walk:  6 Minute Walk     Row Name 05/28/24 1426         6 Minute Walk   Phase Initial     Distance 1500 feet     Walk Time 6 minutes     # of Rest Breaks 0     MPH 2.8     METS 3.4     RPE 11     Perceived Dyspnea  0     VO2 Peak 11.8     Symptoms No     Resting HR 57 bpm     Resting BP 120/80     Resting Oxygen Saturation  98 %     Exercise Oxygen Saturation  during 6 min walk 98 %     Max Ex. HR 58 bpm     Max Ex. BP 140/60     2 Minute Post BP 120/60        Oxygen Initial Assessment:   Oxygen Re-Evaluation:   Oxygen Discharge (Final Oxygen Re-Evaluation):   Initial Exercise Prescription:  Initial Exercise Prescription - 05/28/24 1400       Date of Initial Exercise RX and Referring Provider   Date 05/28/24    Referring Provider End, Lonni MD   Dr. Debera     Treadmill   MPH 2.4    Grade 0.5    Minutes 15    METs 3      REL-XR   Level 3    Speed 50    Minutes 15    METs 2.5       Prescription Details   Frequency (times per week) 3    Duration Progress to 30 minutes of continuous aerobic without signs/symptoms of physical distress      Intensity   THRR 40-80% of Max Heartrate 98-139    Ratings of Perceived Exertion 11-13    Perceived Dyspnea 0-4      Resistance Training   Training Prescription Yes    Weight 7    Reps 10-15          Perform Capillary Blood Glucose checks as needed.  Exercise Prescription Changes:   Exercise Prescription Changes     Row Name 05/28/24 1400 06/12/24 1300           Response to Exercise   Blood Pressure (Admit) 120/80 112/60      Blood Pressure (Exercise) 140/60 122/70      Blood Pressure (Exit) 120/60 114/80      Heart Rate (Admit) 57 bpm 72 bpm      Heart Rate (Exercise) 85 bpm 120 bpm      Heart Rate (Exit) 68 bpm 93 bpm      Oxygen Saturation (Admit) 98 % --      Oxygen Saturation (Exercise) 98 % --      Oxygen Saturation (Exit) 98 % --      Rating of Perceived Exertion (Exercise) 11 13      Perceived Dyspnea (Exercise) 0 --      Duration -- Continue with 30 min of aerobic exercise without signs/symptoms of physical distress.      Intensity -- THRR unchanged        Progression   Progression -- Continue to progress workloads to maintain intensity without signs/symptoms of physical distress.        Resistance Training   Weight -- 7      Reps -- 10-15        Treadmill  MPH -- 3      Grade -- 1      Minutes -- 15      METs -- 3.71        REL-XR   Level -- 4      Speed -- 63      Minutes -- 15      METs -- 4.4         Exercise Comments:   Exercise Comments     Row Name 06/03/24 0800           Exercise Comments First full day of exercise!  Patient was oriented to gym and equipment including functions, settings, policies, and procedures.  Patient's individual exercise prescription and treatment plan were reviewed.  All starting workloads were established based on the results of the 6 minute  walk test done at initial orientation visit.  The plan for exercise progression was also introduced and progression will be customized based on patient's performance and goals.          Exercise Goals and Review:   Exercise Goals     Row Name 05/28/24 1430             Exercise Goals   Increase Physical Activity Yes       Intervention Provide advice, education, support and counseling about physical activity/exercise needs.;Develop an individualized exercise prescription for aerobic and resistive training based on initial evaluation findings, risk stratification, comorbidities and participant's personal goals.       Expected Outcomes Short Term: Attend rehab on a regular basis to increase amount of physical activity.;Long Term: Add in home exercise to make exercise part of routine and to increase amount of physical activity.;Long Term: Exercising regularly at least 3-5 days a week.       Increase Strength and Stamina Yes       Intervention Provide advice, education, support and counseling about physical activity/exercise needs.;Develop an individualized exercise prescription for aerobic and resistive training based on initial evaluation findings, risk stratification, comorbidities and participant's personal goals.       Expected Outcomes Short Term: Increase workloads from initial exercise prescription for resistance, speed, and METs.;Short Term: Perform resistance training exercises routinely during rehab and add in resistance training at home;Long Term: Improve cardiorespiratory fitness, muscular endurance and strength as measured by increased METs and functional capacity ( )       Able to understand and use rate of perceived exertion (RPE) scale Yes       Intervention Provide education and explanation on how to use RPE scale       Expected Outcomes Short Term: Able to use RPE daily in rehab to express subjective intensity level;Long Term:  Able to use RPE to guide intensity level when  exercising independently       Able to understand and use Dyspnea scale Yes       Intervention Provide education and explanation on how to use Dyspnea scale       Expected Outcomes Short Term: Able to use Dyspnea scale daily in rehab to express subjective sense of shortness of breath during exertion;Long Term: Able to use Dyspnea scale to guide intensity level when exercising independently       Knowledge and understanding of Target Heart Rate Range (THRR) Yes       Intervention Provide education and explanation of THRR including how the numbers were predicted and where they are located for reference       Expected Outcomes Short Term: Able to  state/look up THRR;Long Term: Able to use THRR to govern intensity when exercising independently;Short Term: Able to use daily as guideline for intensity in rehab       Able to check pulse independently Yes       Intervention Provide education and demonstration on how to check pulse in carotid and radial arteries.;Review the importance of being able to check your own pulse for safety during independent exercise       Expected Outcomes Short Term: Able to explain why pulse checking is important during independent exercise;Long Term: Able to check pulse independently and accurately       Understanding of Exercise Prescription Yes       Intervention Provide education, explanation, and written materials on patient's individual exercise prescription       Expected Outcomes Short Term: Able to explain program exercise prescription;Long Term: Able to explain home exercise prescription to exercise independently          Exercise Goals Re-Evaluation :  Exercise Goals Re-Evaluation     Row Name 06/03/24 0800 06/21/24 0811           Exercise Goal Re-Evaluation   Exercise Goals Review Increase Physical Activity;Increase Strength and Stamina;Able to understand and use Dyspnea scale;Able to check pulse independently;Knowledge and understanding of Target Heart Rate  Range (THRR);Able to understand and use rate of perceived exertion (RPE) scale;Understanding of Exercise Prescription Increase Physical Activity;Increase Strength and Stamina;Able to understand and use rate of perceived exertion (RPE) scale      Comments Reviewed RPE and dyspnea scale, THR and program prescription with pt today.  Pt voiced understanding and was given a copy of goals to take home. Jondavid is doing well in rehab! He is increasing his levels on the XR and treadmill here at rehab. He goes to the gym on the days he is not here.      Expected Outcomes Short: Use RPE daily to regulate intensity.  Long: Follow program prescription in THR. Short: Continue to attend rehab. Long: Continue to exercise at home.          Discharge Exercise Prescription (Final Exercise Prescription Changes):  Exercise Prescription Changes - 06/12/24 1300       Response to Exercise   Blood Pressure (Admit) 112/60    Blood Pressure (Exercise) 122/70    Blood Pressure (Exit) 114/80    Heart Rate (Admit) 72 bpm    Heart Rate (Exercise) 120 bpm    Heart Rate (Exit) 93 bpm    Rating of Perceived Exertion (Exercise) 13    Duration Continue with 30 min of aerobic exercise without signs/symptoms of physical distress.    Intensity THRR unchanged      Progression   Progression Continue to progress workloads to maintain intensity without signs/symptoms of physical distress.      Resistance Training   Weight 7    Reps 10-15      Treadmill   MPH 3    Grade 1    Minutes 15    METs 3.71      REL-XR   Level 4    Speed 63    Minutes 15    METs 4.4          Nutrition:  Target Goals: Understanding of nutrition guidelines, daily intake of sodium 1500mg , cholesterol 200mg , calories 30% from fat and 7% or less from saturated fats, daily to have 5 or more servings of fruits and vegetables.  Biometrics:  Pre Biometrics - 05/28/24 1431  Pre Biometrics   Height 6' (1.829 m)    Weight 245 lb 9.5 oz  (111.4 kg)    Waist Circumference 45 inches    Hip Circumference 43 inches    Waist to Hip Ratio 1.05 %    BMI (Calculated) 33.3    Grip Strength 24.5 kg    Single Leg Stand 25.06 seconds           Nutrition Therapy Plan and Nutrition Goals:   Nutrition Assessments:  MEDIFICTS Score Key: >=70 Need to make dietary changes  40-70 Heart Healthy Diet <= 40 Therapeutic Level Cholesterol Diet  Flowsheet Row CARDIAC VIRTUAL BASED CARE from 05/27/2024 in [facility] CARDIAC REHABILITATION  Picture Your Plate Total Score on Admission 55   Picture Your Plate Scores: <59 Unhealthy dietary pattern with much room for improvement. 41-50 Dietary pattern unlikely to meet recommendations for good health and room for improvement. 51-60 More healthful dietary pattern, with some room for improvement.  >60 Healthy dietary pattern, although there may be some specific behaviors that could be improved.    Nutrition Goals Re-Evaluation:  Nutrition Goals Re-Evaluation     Row Name 06/21/24 [phone]             Goals   Nutrition Goal Healthy eating.       Comment Arinze is doing well with his diet! He meal preps for lunch everyday which includes white rice, a sweet potato, and grilled chicken or grilled fish. He is drinking enough water  as well.       Expected Outcome Short: Continue adequate water  intake. Long: Continue healthy eating.          Nutrition Goals Discharge (Final Nutrition Goals Re-Evaluation):  Nutrition Goals Re-Evaluation - 06/21/24 0814       Goals   Nutrition Goal Healthy eating.    Comment Rein is doing well with his diet! He meal preps for lunch everyday which includes white rice, a sweet potato, and grilled chicken or grilled fish. He is drinking enough water  as well.    Expected Outcome Short: Continue adequate water  intake. Long: Continue healthy eating.          Psychosocial: Target Goals: Acknowledge presence or absence of significant depression and/or  stress, maximize coping skills, provide positive support system. Participant is able to verbalize types and ability to use techniques and skills needed for reducing stress and depression.  Initial Review & Psychosocial Screening:  Initial Psych Review & Screening - 05/27/24 1011       Initial Review   Current issues with None Identified      Family Dynamics   Good Support System? Yes    Comments Patient's girl friend and daughter support him.      Barriers   Psychosocial barriers to participate in program The patient should benefit from training in stress management and relaxation.;There are no identifiable barriers or psychosocial needs.      Screening Interventions   Interventions To provide support and resources with identified psychosocial needs;Encouraged to exercise;Provide feedback about the scores to participant    Expected Outcomes Short Term goal: Utilizing psychosocial counselor, staff and physician to assist with identification of specific Stressors or current issues interfering with healing process. Setting desired goal for each stressor or current issue identified.;Long Term Goal: Stressors or current issues are controlled or eliminated.;Short Term goal: Identification and review with participant of any Quality of Life or Depression concerns found by scoring the questionnaire.;Long Term goal: The participant improves quality of Life  and PHQ9 Scores as seen by post scores and/or verbalization of changes          Quality of Life Scores:  Quality of Life - 05/28/24 0708       Quality of Life   Select Quality of Life      Quality of Life Scores   Health/Function Pre 24.47 %    Socioeconomic Pre 26.25 %    Psych/Spiritual Pre 28.93 %    Family Pre 30 %    GLOBAL Pre 26.56 %         Scores of 19 and below usually indicate a poorer quality of life in these areas.  A difference of  2-3 points is a clinically meaningful difference.  A difference of 2-3 points in the  total score of the Quality of Life Index has been associated with significant improvement in overall quality of life, self-image, physical symptoms, and general health in studies assessing change in quality of life.  PHQ-9: Review Flowsheet  More data exists      05/28/2024 05/21/2024 01/19/2024 04/10/2023 10/07/2022  Depression screen PHQ 2/9  Decreased Interest 1 0 0 2 0 2  Down, Depressed, Hopeless 0 0 0 0 0 0  PHQ - 2 Score 1 0 0 2 0 2  Altered sleeping 3 3 0 - 3  Tired, decreased energy 3 3 3  - 2  Change in appetite 1 0 0 - 0  Feeling bad or failure about yourself  0 0 0 - 0  Trouble concentrating 2 0 0 - 2  Moving slowly or fidgety/restless 1 0 1 - 0  Suicidal thoughts 0 0 0 - 0  PHQ-9 Score 11 6 6   - 9   Difficult doing work/chores Somewhat difficult Not difficult at all - - -    Details       Data saved with a previous flowsheet row definition   Multiple values from one day are sorted in reverse-chronological order        Interpretation of Total Score  Total Score Depression Severity:  1-4 = Minimal depression, 5-9 = Mild depression, 10-14 = Moderate depression, 15-19 = Moderately severe depression, 20-27 = Severe depression   Psychosocial Evaluation and Intervention:  Psychosocial Evaluation - 05/27/24 1012       Psychosocial Evaluation & Interventions   Interventions Stress management education;Relaxation education;Encouraged to exercise with the program and follow exercise prescription    Comments Patient referred to cardiac rehab with NSTEMI. Hs had a CABG in 2017. He was in a regular exercise routine up until 2 years ago when he developed fatigue and SOB which he continues to have. He denies any depression or anxiety. He sleeps well. His goals for the program are to lose some weigth; improve his fatigue and SOB and get back into an exercise routine. He has no barriers identified to complete the program.    Expected Outcomes Short Term: Patient will start the program  and attend consistently. Long Term: Patient will complete the program meeting personal goals.    Continue Psychosocial Services  Follow up required by staff          Psychosocial Re-Evaluation:  Psychosocial Re-Evaluation     Row Name 06/21/24 0815             Psychosocial Re-Evaluation   Current issues with None Identified       Comments Oluwatimileyin is doing well in rehab! He currently doesn't identify any stressors or sleep concerns. 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       Expected Outcomes Short: Continue to attend rehab. Long: Continue to have stress free lifestyle.       Interventions Encouraged to attend Cardiac Rehabilitation for the exercise       Continue Psychosocial Services  Follow up required by staff          Psychosocial Discharge (Final Psychosocial Re-Evaluation):  Psychosocial Re-Evaluation - 06/21/24 0815       Psychosocial Re-Evaluation   Current issues with None Identified    Comments Elmer is doing well in rehab! He currently doesn't identify any stressors or sleep concerns. 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    Expected Outcomes Short: Continue to attend rehab. Long: Continue to have stress free lifestyle.    Interventions Encouraged to attend Cardiac Rehabilitation for the exercise    Continue Psychosocial Services  Follow up required by staff          Vocational Rehabilitation: Provide vocational rehab assistance to qualifying candidates.   Vocational Rehab Evaluation & Intervention:  Vocational Rehab - 05/27/24 1006       Initial Vocational Rehab Evaluation & Intervention   Assessment shows need for Vocational Rehabilitation No      Vocational Rehab Re-Evaulation   Comments Working full time as a psychologist, sport and exercise.          Education: Education Goals: Education classes will be provided on a weekly basis, covering  required topics. Participant will state understanding/return demonstration of topics presented.  Learning Barriers/Preferences:  Learning Barriers/Preferences - 05/27/24 1018       Learning Barriers/Preferences   Learning Barriers None    Learning Preferences Skilled Demonstration;Computer/Internet;Written Material;Audio          Education Topics: Hypertension, Hypertension Reduction -Define heart disease and high blood pressure. Discus how high blood pressure affects the body and ways to reduce high blood pressure.   Exercise and Your Heart -Discuss why it is important to exercise, the FITT principles of exercise, normal and abnormal responses to exercise, and how to exercise safely.   Angina -Discuss definition of angina, causes of angina, treatment of angina, and how to decrease risk of having angina.   Cardiac Medications -Review what the following cardiac medications are used for, how they affect the body, and side effects that may occur when taking the medications.  Medications include Aspirin , Beta blockers, calcium  channel blockers, ACE Inhibitors, angiotensin receptor blockers, diuretics, digoxin, and antihyperlipidemics.   Congestive Heart Failure -Discuss the definition of CHF, how to live with CHF, the signs and symptoms of CHF, and how keep track of weight and sodium intake.   Heart Disease and Intimacy -Discus the effect sexual activity has on the heart, how changes occur during intimacy as we age, and safety during sexual activity.   Smoking Cessation / COPD -Discuss different methods to quit smoking, the health benefits of quitting smoking, and the definition of COPD.   Nutrition I: Fats -Discuss the types of cholesterol, what cholesterol does to the heart, and how cholesterol levels can be controlled.   Nutrition II: Labels -Discuss the different components of food labels and how to read food label   Heart Parts/Heart Disease and PAD -Discuss the  anatomy of the heart, the pathway of blood circulation through the heart, and these are affected by heart disease.  Stress I: Signs and Symptoms -Discuss the causes of stress, how stress may lead to anxiety and depression, and ways to limit stress.   Stress II: Relaxation -Discuss different types of relaxation techniques to limit stress.   Warning Signs of Stroke / TIA -Discuss definition of a stroke, what the signs and symptoms are of a stroke, and how to identify when someone is having stroke.   Knowledge Questionnaire Score:  Knowledge Questionnaire Score - 05/28/24 0707       Knowledge Questionnaire Score   Pre Score 23/26          Core Components/Risk Factors/Patient Goals at Admission:  Personal Goals and Risk Factors at Admission - 05/27/24 1006       Core Components/Risk Factors/Patient Goals on Admission    Weight Management Weight Maintenance    Improve shortness of breath with ADL's Yes    Intervention Provide education, individualized exercise plan and daily activity instruction to help decrease symptoms of SOB with activities of daily living.    Expected Outcomes Short Term: Improve cardiorespiratory fitness to achieve a reduction of symptoms when performing ADLs;Long Term: Be able to perform more ADLs without symptoms or delay the onset of symptoms    Hypertension Yes    Intervention Provide education on lifestyle modifcations including regular physical activity/exercise, weight management, moderate sodium restriction and increased consumption of fresh fruit, vegetables, and low fat dairy, alcohol moderation, and smoking cessation.;Monitor prescription use compliance.    Expected Outcomes Short Term: Continued assessment and intervention until BP is < 140/5mm HG in hypertensive participants. < 130/71mm HG in hypertensive participants with diabetes, heart failure or chronic kidney disease.;Long Term: Maintenance of blood pressure at goal levels.    Lipids Yes     Intervention Provide education and support for participant on nutrition & aerobic/resistive exercise along with prescribed medications to achieve LDL 70mg , HDL >40mg .    Expected Outcomes Short Term: Participant states understanding of desired cholesterol values and is compliant with medications prescribed. Participant is following exercise prescription and nutrition guidelines.;Long Term: Cholesterol controlled with medications as prescribed, with individualized exercise RX and with personalized nutrition plan. Value goals: LDL < 70mg , HDL > 40 mg.          Core Components/Risk Factors/Patient Goals Review:   Goals and Risk Factor Review     Row Name 06/21/24 0812             Core Components/Risk Factors/Patient Goals Review   Personal Goals Review Hypertension;Lipids       Review Filipe is doing well in rehab! He states he doesn't check his BP at home, that he does good to remember his medicines. Encouraged to get a BP machine and keep an eye on it at home. His numbers run well while he is here though. He attends all his doctor's appointments as he should.       Expected Outcomes Short: Continue to attend rehab. Long: Look into getting a  home BP machine.          Core Components/Risk Factors/Patient Goals at Discharge (Final Review):   Goals and Risk Factor Review - 06/21/24 0812       Core Components/Risk Factors/Patient Goals Review   Personal Goals Review Hypertension;Lipids    Review Deago is doing well in rehab! He states he doesn't check his BP at home, that he does good to remember his medicines. Encouraged to get a BP machine and keep an eye on it at home. His numbers run well  while he is here though. He attends all his doctor's appointments as he should.    Expected Outcomes Short: Continue to attend rehab. Long: Look into getting a  home BP machine.          ITP Comments:  ITP Comments     Row Name 05/27/24 1023 05/28/24 1409 06/03/24 0800 06/04/24 1212 07/02/24  0940   ITP Comments Virtual orientation visit completed for cardiac rehab with NSTEMI. On-site orientation visit scheduled for 05/28/24. Patient arrived for 1st visit/orientation/education at 1330. Patient was referred to CR by Dr. Lonni End/Dr. Debera attending due to NSTEMI. During orientation advised patient on arrival and appointment times what to wear, what to do before, during and after exercise. Reviewed attendance and class policy.  Pt is scheduled to return Cardiac Rehab on 06/03/24 at 745. Pt was advised to come to class 15 minutes before class starts.  Discussed RPE/Dpysnea scales. Patient participated in warm up stretches. Patient was able to complete 6 minute walk test.  Telemetry:NSR. Patient was measured for the equipment. Discussed equipment safety with patient. Took patient pre-anthropometric measurements. Patient finished visit at 1420. First full day of exercise!  Patient was oriented to gym and equipment including functions, settings, policies, and procedures.  Patient's individual exercise prescription and treatment plan were reviewed.  All starting workloads were established based on the results of the 6 minute walk test done at initial orientation visit.  The plan for exercise progression was also introduced and progression will be customized based on patient's performance and goals. 30 day review completed. ITP sent to Dr. Dorn Ross, Medical Director of Cardiac Rehab. Continue with ITP unless changes are made by physician.    New to program, just attended first day of exercise yesterday. 30 day review completed. ITP sent to Dr. Dorn Ross, Medical Director of Cardiac Rehab. Continue with ITP unless changes are made by physician.      Comments: 30 day review     [1]  Current Outpatient Medications:    aspirin  EC 81 MG EC tablet, Take 1 tablet (81 mg total) by mouth daily., Disp: , Rfl:    Cholecalciferol  (VITAMIN D ) 50 MCG (2000 UT) CAPS, Take 2,000 Units by  mouth daily., Disp: , Rfl:    clopidogrel  (PLAVIX ) 75 MG tablet, Take 1 tablet (75 mg total) by mouth daily with breakfast., Disp: 90 tablet, Rfl: 3   ezetimibe  (ZETIA ) 10 MG tablet, Take 1 tablet (10 mg total) by mouth daily., Disp: 30 tablet, Rfl: 3   isosorbide  mononitrate (IMDUR ) 60 MG 24 hr tablet, Take 1 tablet (60 mg total) by mouth daily., Disp: 90 tablet, Rfl: 3   metoprolol  succinate (TOPROL -XL) 25 MG 24 hr tablet, TAKE 1 TABLET BY MOUTH DAILY, Disp: 90 tablet, Rfl: 3   nitroGLYCERIN  (NITROSTAT ) 0.4 MG SL tablet, Place 1 tablet (0.4 mg total) under the tongue every 5 (five) minutes x 3 doses as needed for chest pain., Disp: 25 tablet, Rfl: 2   olmesartan  (BENICAR ) 40 MG tablet, Take 1 tablet (40 mg total) by mouth daily., Disp: 30 tablet, Rfl: 11   pantoprazole  (PROTONIX ) 40 MG tablet, TAKE 1 TABLET BY MOUTH 2 TIMES A DAY BEFORE A MEAL, Disp: 60 tablet, Rfl: 5   ranolazine  (RANEXA ) 500 MG 12 hr tablet, Take 1 tablet (500 mg total) by mouth 2 (two) times daily., Disp: 60 tablet, Rfl: 1   rosuvastatin  (CRESTOR ) 10 MG tablet, Take 1 tablet (10 mg total) by mouth daily., Disp: 30 tablet, Rfl:  3 [2]  Social History Tobacco Use  Smoking Status Never  Smokeless Tobacco Never

## 2024-07-03 ENCOUNTER — Encounter (HOSPITAL_COMMUNITY): Admission: RE | Admit: 2024-07-03 | Discharge: 2024-07-03 | Attending: Cardiology

## 2024-07-03 DIAGNOSIS — I214 Non-ST elevation (NSTEMI) myocardial infarction: Secondary | ICD-10-CM

## 2024-07-03 NOTE — Progress Notes (Signed)
 Daily Session Note  Patient Details  Name: Justin Villa MRN: [id number] Date of Birth: 11/28/1965 Referring Provider:   Flowsheet Row CARDIAC REHAB PHASE II ORIENTATION from 05/28/2024 in [facility] CARDIAC REHABILITATION  Referring Provider End, Lonni MD  [Dr. McDowell]    Encounter Date: 07/03/2024  Check In:  Session Check In - 07/03/24 0922       Check-In   Supervising physician immediately available to respond to emergencies See telemetry face sheet for immediately available MD    Location [facility]    Staff Present Powell Benders, BS, Exercise Physiologist;Brittany Jackquline, BSN, RN, WTA-C;Hillary Troutman BSN, RN;Jessica June Park, MA, RCEP, CCRP, CCET    Virtual Visit No    Medication changes reported     No    Fall or balance concerns reported    No    Tobacco Cessation No Change    Warm-up and Cool-down Performed on first and last piece of equipment    Resistance Training Performed Yes    VAD Patient? No    PAD/SET Patient? No      Pain Assessment   Currently in Pain? No/denies    Pain Score 0-No pain    Multiple Pain Sites No          Capillary Blood Glucose: No results found for this or any previous visit (from the past 24 hours).    Tobacco Use History[1]  Goals Met:  Independence with exercise equipment Exercise tolerated well No report of concerns or symptoms today Strength training completed today  Goals Unmet:  Not Applicable  Comments: Pt able to follow exercise prescription today without complaint.  Will continue to monitor for progression.        [1]  Social History Tobacco Use  Smoking Status Never  Smokeless Tobacco Never

## 2024-07-05 ENCOUNTER — Ambulatory Visit: Admitting: Emergency Medicine

## 2024-07-05 ENCOUNTER — Encounter (HOSPITAL_COMMUNITY)

## 2024-07-08 ENCOUNTER — Encounter (HOSPITAL_COMMUNITY)

## 2024-07-09 ENCOUNTER — Institutional Professional Consult (permissible substitution): Admitting: Neurology

## 2024-07-09 ENCOUNTER — Encounter: Payer: Self-pay | Admitting: Pharmacist Clinician (PhC)/ Clinical Pharmacy Specialist

## 2024-07-09 ENCOUNTER — Encounter: Payer: Self-pay | Admitting: Neurology

## 2024-07-10 ENCOUNTER — Ambulatory Visit: Admitting: Orthopedic Surgery

## 2024-07-10 ENCOUNTER — Telehealth (HOSPITAL_COMMUNITY): Payer: Self-pay | Admitting: Surgery

## 2024-07-10 ENCOUNTER — Encounter: Payer: Self-pay | Admitting: Orthopedic Surgery

## 2024-07-10 ENCOUNTER — Encounter (HOSPITAL_COMMUNITY)

## 2024-07-10 DIAGNOSIS — M75111 Incomplete rotator cuff tear or rupture of right shoulder, not specified as traumatic: Secondary | ICD-10-CM

## 2024-07-10 NOTE — Progress Notes (Signed)
 "  Office Visit Note   Patient: Justin Villa           Date of Birth: [birth year]           MRN: [id number] Visit Date: 07/10/2024 Requested by: Sherlynn Madden, MD [phone] [place],  [place] [address] PCP: Sherlynn Madden, MD  Subjective: Chief Complaint  Patient presents with   Right Shoulder - Follow-up    HPI: Justin Villa is a 59 y.o. male who presents to the office reporting right shoulder pain.  Right glenohumeral joint injection on 03/07/2024 gave him very good relief.  He has known rotator cuff arthropathy.  He is right-hand dominant.  Still goes to the gym about 3 times a week.  Doing cardiac rehab.  Had a light heart attack 06/29/2023.  Feeling some better after that..                ROS: All systems reviewed are negative as they relate to the chief complaint within the history of present illness.  Patient denies fevers or chills.  Assessment & Plan: Visit Diagnoses:  1. Nontraumatic incomplete tear of right rotator cuff     Plan: Impression is right shoulder pain with good relief from glenohumeral joint injection.  We can do another injection in the future when his symptoms are worse.  Follow-up with us  as needed.  Follow-Up Instructions: No follow-ups on file.   Orders:  No orders of the defined types were placed in this encounter.  No orders of the defined types were placed in this encounter.     Procedures: No procedures performed   Clinical Data: No additional findings.  Objective: Vital Signs: There were no vitals taken for this visit.  Physical Exam:  Constitutional: Patient appears well-developed HEENT:  Head: Normocephalic Eyes:EOM are normal Neck: Normal range of motion Cardiovascular: Normal rate Pulmonary/chest: Effort normal Neurologic: Patient is alert Skin: Skin is warm Psychiatric: Patient has normal mood and affect  Ortho Exam: Ortho exam demonstrates range of motion on the right of 70/100/150.  He has  active forward flexion abduction both about 90 degrees.  Rotator cuff strength internal/external rotation surprisingly good at subscap strength 5+ out of 5 external rotation strength at 5- out of 5.  Does have some popping and coarseness consistent with known history of rotator cuff tearing.  Specialty Comments:  No specialty comments available.  Imaging: No results found.   PMFS History: Patient Active Problem List   Diagnosis Date Noted   DOE (dyspnea on exertion) 02/01/2024   Vertigo 01/19/2024   Other fatigue 01/19/2024   Acute osteomyelitis of metatarsal bone of right foot (HCC) 08/23/2023   History of colonic polyps 07/06/2023   Hypocalcemia 04/10/2023   Left carpal tunnel syndrome 10/19/2022   Loud snoring 10/12/2022   Facial skin lesion 10/07/2022   Prediabetes 10/07/2022   Vitamin D  insufficiency 10/07/2022   Barrett's esophagus 10/07/2022   GERD (gastroesophageal reflux disease) 07/05/2022   Numbness and tingling in left hand 06/19/2022   History of diverticulitis 06/19/2022   Encounter for general adult medical examination with abnormal findings 06/19/2022   Risk factors for obstructive sleep apnea 06/19/2022   S/P foot surgery, right I and D 06/16/20  06/18/2020   Septic arthritis of right foot (HCC)    Pain in right foot 06/15/2020   Essential hypertension 12/10/2015   HLD (hyperlipidemia) 12/10/2015   Carotid stenosis    CAD (coronary artery disease) 11/27/2015   NSTEMI (non-ST  elevated myocardial infarction) (HCC) 11/26/2015   Arthropathy, forearm 07/19/2010   Past Medical History:  Diagnosis Date   CAD (coronary artery disease) 11/27/2015   a. S/p NSTEMI 6/17 >> s/p CABG // b. LHC 6/17: oLAD 50, mLAD 100, D1 70, D2 95, oLCx 95, mLCx 65, OM1 85, OM2 90, mRCA 85, dRCA 75, RPDA 40/50, RPLB2 50    Carotid stenosis    a. Carotid US  6/17: bilat ICA 1-39%   Essential hypertension    GERD (gastroesophageal reflux disease)    History of non-ST elevation myocardial  infarction (NSTEMI) 11/2015   Hyperlipidemia    Stroke [facility])     Family History  Problem Relation Age of Onset   Heart disease Father    Colon cancer Neg Hx     Past Surgical History:  Procedure Laterality Date   AMPUTATION Right 08/23/2023   Procedure: AMPUTATION, FOOT, RAY;  Surgeon: Harden Jerona GAILS, MD;  Location: [facility] OR;  Service: Orthopedics;  Laterality: Right;  RIGHT 5TH RAY AMPUTATION   BACK SURGERY     BIOPSY  08/25/2022   Procedure: BIOPSY;  Surgeon: Cindie Carlin POUR, DO;  Location: [facility] ENDO SUITE;  Service: Endoscopy;;   CARDIAC CATHETERIZATION N/A 11/26/2015   Procedure: Left Heart Cath and Coronary Angiography;  Surgeon: Victory LELON Sharps, MD;  Location: [facility] INVASIVE CV LAB;  Service: Cardiovascular;  Laterality: N/A;   CARPAL TUNNEL RELEASE Left 10/19/2022   Procedure: CARPAL TUNNEL RELEASE;  Surgeon: Margrette Taft BRAVO, MD;  Location: [facility] ORS;  Service: Orthopedics;  Laterality: Left;   COLONOSCOPY WITH PROPOFOL  N/A 08/25/2022   Procedure: COLONOSCOPY WITH PROPOFOL ;  Surgeon: Cindie Carlin POUR, DO;  Location: [facility];  Service: Endoscopy;  Laterality: N/A;  10:30am, asa 3   CORONARY ARTERY BYPASS GRAFT N/A 11/27/2015   Procedure: CORONARY ARTERY BYPASS GRAFTING (CABG) x 5 (LIMA to LAD, SVG to DIAGONAL, SVG SEQUENTIALLY to OM1 and OM2, SVG to PDA) with EVH from right leg using greater saphenous vein and mammary.;  Surgeon: Dallas KATHEE Jude, MD;  Location: [facility] OR;  Service: Open Heart Surgery;  Laterality: N/A;   ESOPHAGOGASTRODUODENOSCOPY (EGD) WITH PROPOFOL  N/A 08/25/2022   Procedure: ESOPHAGOGASTRODUODENOSCOPY (EGD) WITH PROPOFOL ;  Surgeon: Cindie Carlin POUR, DO;  Location[facility];  Service: Endoscopy;  Laterality: N/A;   INCISION AND DRAINAGE ABSCESS Right 06/16/2020   Procedure: INCISION AND DRAINAGE ABSCESS RIGHT PINKY TOE (5th toe);  Surgeon: Margrette Taft BRAVO, MD;  Location: [facility] ORS;  Service: Orthopedics;  Laterality: Right;   INTRAOPERATIVE TRANSESOPHAGEAL  ECHOCARDIOGRAM N/A 11/27/2015   Procedure: INTRAOPERATIVE TRANSESOPHAGEAL ECHOCARDIOGRAM;  Surgeon: Dallas KATHEE Jude, MD;  Location: [facility] OR;  Service: Open Heart Surgery;  Laterality: N/A;   KNEE SURGERY Left    arthroscopy   LEFT HEART CATH AND CORS/GRAFTS ANGIOGRAPHY N/A 04/25/2024   Procedure: LEFT HEART CATH AND CORS/GRAFTS ANGIOGRAPHY;  Surgeon: Mady Bruckner, MD;  Location: [facility] INVASIVE CV LAB;  Service: Cardiovascular;  Laterality: N/A;   POLYPECTOMY  08/25/2022   Procedure: POLYPECTOMY;  Surgeon: Cindie Carlin POUR, DO;  Location: [facility];  Service: Endoscopy;;   Social History   Occupational History   Not on file  Tobacco Use   Smoking status: Never   Smokeless tobacco: Never  Vaping Use   Vaping status: Never Used  Substance and Sexual Activity   Alcohol use: Yes    Alcohol/week: 2.0 standard drinks of alcohol    Types: 2 Shots of liquor per week    Comment: everyother day  Drug use: No   Sexual activity: Yes        "

## 2024-07-10 NOTE — Telephone Encounter (Signed)
 Please do PA for Repatha

## 2024-07-10 NOTE — Telephone Encounter (Signed)
 I called the pt since he missed his cardiac rehab session today.  The pt stated that he had a doctor's appt in [place] today, but that he would be back for rehab on Friday.

## 2024-07-11 ENCOUNTER — Telehealth: Payer: Self-pay | Admitting: Pharmacy Technician

## 2024-07-11 ENCOUNTER — Institutional Professional Consult (permissible substitution): Admitting: Neurology

## 2024-07-11 ENCOUNTER — Other Ambulatory Visit (HOSPITAL_COMMUNITY): Payer: Self-pay

## 2024-07-11 NOTE — Telephone Encounter (Signed)
 Pharmacy Patient Advocate Encounter   Received notification from Physician's Office that prior authorization for repatha  is required/requested.   Insurance verification completed.   The patient is insured through [facility] [employer].   Per test claim: PA required; PA submitted to above mentioned insurance via Latent Key/confirmation #/EOC [id number] Status is pending

## 2024-07-12 ENCOUNTER — Encounter (HOSPITAL_COMMUNITY): Admission: RE | Admit: 2024-07-12

## 2024-07-12 ENCOUNTER — Encounter: Payer: Self-pay | Admitting: Pharmacist Clinician (PhC)/ Clinical Pharmacy Specialist

## 2024-07-12 ENCOUNTER — Other Ambulatory Visit (HOSPITAL_COMMUNITY): Payer: Self-pay

## 2024-07-12 ENCOUNTER — Ambulatory Visit: Admitting: Cardiology

## 2024-07-12 DIAGNOSIS — E782 Mixed hyperlipidemia: Secondary | ICD-10-CM

## 2024-07-12 DIAGNOSIS — I214 Non-ST elevation (NSTEMI) myocardial infarction: Secondary | ICD-10-CM

## 2024-07-12 MED ORDER — REPATHA SURECLICK 140 MG/ML ~~LOC~~ SOAJ: 140.0000 mg | SUBCUTANEOUS | 3 refills | Status: AC

## 2024-07-12 NOTE — Progress Notes (Signed)
 Daily Session Note  Patient Details  Name: Justin Villa MRN: [id number] Date of Birth: 03-16-1966 Referring Provider:   Flowsheet Row CARDIAC REHAB PHASE II ORIENTATION from 05/28/2024 in [facility] CARDIAC REHABILITATION  Referring Provider End, Lonni MD  [Dr. McDowell]    Encounter Date: 07/12/2024  Check In:  Session Check In - 07/12/24 0807       Check-In   Supervising physician immediately available to respond to emergencies See telemetry face sheet for immediately available MD    Location [facility]    Staff Present Powell Benders, BS, Exercise Physiologist;Brittany Jackquline, BSN, RN, Rosalba Gelineau, MA, RCEP, CCRP, CCET    Virtual Visit No    Medication changes reported     No    Fall or balance concerns reported    No    Tobacco Cessation No Change    Warm-up and Cool-down Performed on first and last piece of equipment    Resistance Training Performed Yes    VAD Patient? No    PAD/SET Patient? No      Pain Assessment   Currently in Pain? No/denies    Pain Score 0-No pain    Multiple Pain Sites No          Capillary Blood Glucose: No results found for this or any previous visit (from the past 24 hours).    Tobacco Use History[1]  Goals Met:  Independence with exercise equipment Exercise tolerated well No report of concerns or symptoms today Strength training completed today  Goals Unmet:  Not Applicable  Comments: Pt able to follow exercise prescription today without complaint.  Will continue to monitor for progression.        [1]  Social History Tobacco Use  Smoking Status Never  Smokeless Tobacco Never

## 2024-07-12 NOTE — Addendum Note (Signed)
 Addended by: Rim Thatch L on: 07/12/2024 02:16 PM   Modules accepted: Orders

## 2024-07-12 NOTE — Telephone Encounter (Signed)
 Pharmacy Patient Advocate Encounter  Received notification from [facility] [employer] that Prior Authorization for Repatha  has been APPROVED from 07/12/24 to 10/09/24   PA #/Case ID/Reference #: [id number]   Patient must call plan- not able to get test claim due to non payment

## 2024-07-15 ENCOUNTER — Encounter (HOSPITAL_COMMUNITY)

## 2024-07-15 ENCOUNTER — Ambulatory Visit: Admitting: Cardiology

## 2024-07-17 ENCOUNTER — Encounter (HOSPITAL_COMMUNITY)

## 2024-07-19 ENCOUNTER — Encounter (HOSPITAL_COMMUNITY)

## 2024-07-22 ENCOUNTER — Encounter (HOSPITAL_COMMUNITY)

## 2024-07-24 ENCOUNTER — Encounter (HOSPITAL_COMMUNITY)

## 2024-07-26 ENCOUNTER — Encounter (HOSPITAL_COMMUNITY)

## 2024-07-29 ENCOUNTER — Encounter (HOSPITAL_COMMUNITY)

## 2024-07-31 ENCOUNTER — Encounter (HOSPITAL_COMMUNITY)

## 2024-08-02 ENCOUNTER — Encounter (HOSPITAL_COMMUNITY)

## 2024-08-05 ENCOUNTER — Encounter (HOSPITAL_COMMUNITY)

## 2024-08-07 ENCOUNTER — Encounter (HOSPITAL_COMMUNITY)

## 2024-08-09 ENCOUNTER — Encounter (HOSPITAL_COMMUNITY)

## 2024-08-12 ENCOUNTER — Encounter (HOSPITAL_COMMUNITY)

## 2024-08-13 ENCOUNTER — Ambulatory Visit: Admitting: Cardiology

## 2024-08-14 ENCOUNTER — Encounter (HOSPITAL_COMMUNITY)

## 2024-08-16 ENCOUNTER — Encounter (HOSPITAL_COMMUNITY)

## 2024-08-19 ENCOUNTER — Encounter (HOSPITAL_COMMUNITY)

## 2024-08-19 ENCOUNTER — Ambulatory Visit: Admitting: Sports Medicine

## 2024-08-21 ENCOUNTER — Encounter (HOSPITAL_COMMUNITY)
